# Patient Record
Sex: Female | Born: 1970 | Race: Black or African American | Hispanic: No | Marital: Single | Smoking: Never smoker
Health system: Southern US, Community
[De-identification: ages and names within clinical notes are randomized; demographics above are authoritative.]

## PROBLEM LIST (undated history)

## (undated) ENCOUNTER — Inpatient Hospital Stay (HOSPITAL_COMMUNITY): Payer: Self-pay

## (undated) DIAGNOSIS — O0990 Supervision of high risk pregnancy, unspecified, unspecified trimester: Secondary | ICD-10-CM

## (undated) DIAGNOSIS — F419 Anxiety disorder, unspecified: Secondary | ICD-10-CM

## (undated) DIAGNOSIS — Z641 Problems related to multiparity: Secondary | ICD-10-CM

## (undated) DIAGNOSIS — R002 Palpitations: Secondary | ICD-10-CM

## (undated) DIAGNOSIS — O24419 Gestational diabetes mellitus in pregnancy, unspecified control: Secondary | ICD-10-CM

## (undated) DIAGNOSIS — I10 Essential (primary) hypertension: Secondary | ICD-10-CM

## (undated) HISTORY — PX: NO PAST SURGERIES: SHX2092

---

## 2011-07-02 ENCOUNTER — Encounter (HOSPITAL_BASED_OUTPATIENT_CLINIC_OR_DEPARTMENT_OTHER): Payer: Self-pay | Admitting: *Deleted

## 2011-07-02 ENCOUNTER — Emergency Department (HOSPITAL_BASED_OUTPATIENT_CLINIC_OR_DEPARTMENT_OTHER)
Admission: EM | Admit: 2011-07-02 | Discharge: 2011-07-03 | Disposition: A | Discharge status: Home / Self Care | Payer: Medicaid Other | Attending: Emergency Medicine | Admitting: Emergency Medicine

## 2011-07-02 DIAGNOSIS — O169 Unspecified maternal hypertension, unspecified trimester: Secondary | ICD-10-CM | POA: Insufficient documentation

## 2011-07-02 DIAGNOSIS — Z3490 Encounter for supervision of normal pregnancy, unspecified, unspecified trimester: Secondary | ICD-10-CM

## 2011-07-02 DIAGNOSIS — O09529 Supervision of elderly multigravida, unspecified trimester: Secondary | ICD-10-CM | POA: Insufficient documentation

## 2011-07-02 DIAGNOSIS — O209 Hemorrhage in early pregnancy, unspecified: Secondary | ICD-10-CM | POA: Insufficient documentation

## 2011-07-02 HISTORY — DX: Anxiety disorder, unspecified: F41.9

## 2011-07-02 HISTORY — DX: Essential (primary) hypertension: I10

## 2011-07-02 LAB — DIFFERENTIAL
Eosinophils Absolute: 0.3 10*3/uL (ref 0.0–0.7)
Eosinophils Relative: 3 % (ref 0–5)
Lymphocytes Relative: 26 % (ref 12–46)
Lymphs Abs: 2.9 10*3/uL (ref 0.7–4.0)
Monocytes Relative: 6 % (ref 3–12)

## 2011-07-02 LAB — CBC
Hemoglobin: 12.4 g/dL (ref 12.0–15.0)
MCH: 28.8 pg (ref 26.0–34.0)
MCV: 83.3 fL (ref 78.0–100.0)
Platelets: 320 10*3/uL (ref 150–400)
RBC: 4.31 MIL/uL (ref 3.87–5.11)
WBC: 11.2 10*3/uL — ABNORMAL HIGH (ref 4.0–10.5)

## 2011-07-02 LAB — WET PREP, GENITAL
Trich, Wet Prep: NONE SEEN
Yeast Wet Prep HPF POC: NONE SEEN

## 2011-07-02 LAB — ABO/RH: ABO/RH(D): O POS

## 2011-07-02 LAB — PREGNANCY, URINE: Preg Test, Ur: POSITIVE — AB

## 2011-07-02 NOTE — Discharge Instructions (Signed)
ABCs of Pregnancy A Antepartum care is very important. Be sure you see your doctor and get prenatal care as soon as you think you are pregnant. At this time, you will be tested for infection, genetic abnormalities and potential problems with you and the pregnancy. This is the time to discuss diet, exercise, work, medications, labor, pain medication during labor and the possibility of a cesarean delivery. Ask any questions that may concern you. It is important to see your doctor regularly throughout your pregnancy. Avoid exposure to toxic substances and chemicals - such as cleaning solvents, lead and mercury, some insecticides, and paint. Pregnant women should avoid exposure to paint fumes, and fumes that cause you to feel ill, dizzy or faint. When possible, it is a good idea to have a pre-pregnancy consultation with your caregiver to begin some important recommendations your caregiver suggests such as, taking folic acid, exercising, quitting smoking, avoiding alcoholic beverages, etc. B Breastfeeding is the healthiest choice for both you and your baby. It has many nutritional benefits for the baby and health benefits for the mother. It also creates a very tight and loving bond between the baby and mother. Talk to your doctor, your family and friends, and your employer about how you choose to feed your baby and how they can support you in your decision. Not all birth defects can be prevented, but a woman can take actions that may increase her chance of having a healthy baby. Many birth defects happen very early in pregnancy, sometimes before a woman even knows she is pregnant. Birth defects or abnormalities of any child in your or the father's family should be discussed with your caregiver. Get a good support bra as your breast size changes. Wear it especially when you exercise and when nursing.  C Celebrate the news of your pregnancy with the your spouse/father and family. Childbirth classes are helpful to  take for you and the spouse/father because it helps to understand what happens during the pregnancy, labor and delivery. Cesarean delivery should be discussed with your doctor so you are prepared for that possibility. The pros and cons of circumcision if it is a boy, should be discussed with your pediatrician. Cigarette smoking during pregnancy can result in low birth weight babies. It has been associated with infertility, miscarriages, tubal pregnancies, infant death (mortality) and poor health (morbidity) in childhood. Additionally, cigarette smoking may cause long-term learning disabilities. If you smoke, you should try to quit before getting pregnant and not smoke during the pregnancy. Secondary smoke may also harm a mother and her developing baby. It is a good idea to ask people to stop smoking around you during your pregnancy and after the baby is born. Extra calcium is necessary when you are pregnant and is found in your prenatal vitamin, in dairy products, green leafy vegetables and in calcium supplements. D A healthy diet according to your current weight and height, along with vitamins and mineral supplements should be discussed with your caregiver. Domestic abuse or violence should be made known to your doctor right away to get the situation corrected. Drink more water when you exercise to keep hydrated. Discomfort of your back and legs usually develops and progresses from the middle of the second trimester through to delivery of the baby. This is because of the enlarging baby and uterus, which may also affect your balance. Do not take illegal drugs. Illegal drugs can seriously harm the baby and you. Drink extra fluids (water is best) throughout pregnancy to help  your body keep up with the increases in your blood volume. Drink at least 6 to 8 glasses of water, fruit juice, or milk each day. A good way to know you are drinking enough fluid is when your urine looks almost like clear water or is very light  yellow.  E Eat healthy to get the nutrients you and your unborn baby need. Your meals should include the five basic food groups. Exercise (30 minutes of light to moderate exercise a day) is important and encouraged during pregnancy, if there are no medical problems or problems with the pregnancy. Exercise that causes discomfort or dizziness should be stopped and reported to your caregiver. Emotions during pregnancy can change from being ecstatic to depression and should be understood by you, your partner and your family. F Fetal screening with ultrasound, amniocentesis and monitoring during pregnancy and labor is common and sometimes necessary. Take 400 micrograms of folic acid daily both before, when possible, and during the first few months of pregnancy to reduce the risk of birth defects of the brain and spine. All women who could possibly become pregnant should take a vitamin with folic acid, every day. It is also important to eat a healthy diet with fortified foods (enriched grain products, including cereals, rice, breads, and pastas) and foods with natural sources of folate (orange juice, green leafy vegetables, beans, peanuts, broccoli, asparagus, peas, and lentils). The father should be involved with all aspects of the pregnancy including, the prenatal care, childbirth classes, labor, delivery, and postpartum time. Fathers may also have emotional concerns about being a father, financial needs, and raising a family. G Genetic testing should be done appropriately. It is important to know your family and the father's history. If there have been problems with pregnancies or birth defects in your family, report these to your doctor. Also, genetic counselors can talk with you about the information you might need in making decisions about having a family. You can call a major medical center in your area for help in finding a board-certified genetic counselor. Genetic testing and counseling should be done  before pregnancy when possible, especially if there is a history of problems in the mother's or father's family. Certain ethnic backgrounds are more at risk for genetic defects. H Get familiar with the hospital where you will be having your baby. Get to know how long it takes to get there, the labor and delivery area, and the hospital procedures. Be sure your medical insurance is accepted there. Get your home ready for the baby including, clothes, the baby's room (when possible), furniture and car seat. Hand washing is important throughout the day, especially after handling raw meat and poultry, changing the baby's diaper or using the bathroom. This can help prevent the spread of many bacteria and viruses that cause infection. Your hair may become dry and thinner, but will return to normal a few weeks after the baby is born. Heartburn is a common problem that can be treated by taking antacids recommended by your caregiver, eating smaller meals 5 or 6 times a day, not drinking liquids when eating, drinking between meals and raising the head of your bed 2 to 3 inches. I Insurance to cover you, the baby, doctor and hospital should be reviewed so that you will be prepared to pay any costs not covered by your insurance plan. If you do not have medical insurance, there are usually clinics and services available for you in your community. Take 30 milligrams of iron during  your pregnancy as prescribed by your doctor to reduce the risk of low red blood cells (anemia) later in pregnancy. All women of childbearing age should eat a diet rich in iron. J There should be a joint effort for the mother, father and any other children to adapt to the pregnancy financially, emotionally, and psychologically during the pregnancy. Join a support group for moms-to-be. Or, join a class on parenting or childbirth. Have the family participate when possible. K Know your limits. Let your caregiver know if you experience any of the  following:   Pain of any kind.   Strong cramps.   You develop a lot of weight in a short period of time (5 pounds in 3 to 5 days).   Vaginal bleeding, leaking of amniotic fluid.   Headache, vision problems.   Dizziness, fainting, shortness of breath.   Chest pain.   Fever of 102 F (38.9 C) or higher.   Gush of clear fluid from your vagina.   Painful urination.   Domestic violence.   Irregular heartbeat (palpitations).   Rapid beating of the heart (tachycardia).   Constant feeling sick to your stomach (nauseous) and vomiting.   Trouble walking, fluid retention (edema).   Muscle weakness.   If your baby has decreased activity.   Persistent diarrhea.   Abnormal vaginal discharge.   Uterine contractions at 20-minute intervals.   Back pain that travels down your leg.  L Learn and practice that what you eat and drink should be in moderation and healthy for you and your baby. Legal drugs such as alcohol and caffeine are important issues for pregnant women. There is no safe amount of alcohol a woman can drink while pregnant. Fetal alcohol syndrome, a disorder characterized by growth retardation, facial abnormalities, and central nervous system dysfunction, is caused by a woman's use of alcohol during pregnancy. Caffeine, found in tea, coffee, soft drinks and chocolate, should also be limited. Be sure to read labels when trying to cut down on caffeine during pregnancy. More than 200 foods, beverages, and over-the-counter medications contain caffeine and have a high salt content! There are coffees and teas that do not contain caffeine. M Medical conditions such as diabetes, epilepsy, and high blood pressure should be treated and kept under control before pregnancy when possible, but especially during pregnancy. Ask your caregiver about any medications that may need to be changed or adjusted during pregnancy. If you are currently taking any medications, ask your caregiver if it  is safe to take them while you are pregnant or before getting pregnant when possible. Also, be sure to discuss any herbs or vitamins you are taking. They are medicines, too! Discuss with your doctor all medications, prescribed and over-the-counter, that you are taking. During your prenatal visit, discuss the medications your doctor may give you during labor and delivery. N Never be afraid to ask your doctor or caregiver questions about your health, the progress of the pregnancy, family problems, stressful situations, and recommendation for a pediatrician, if you do not have one. It is better to take all precautions and discuss any questions or concerns you may have during your office visits. It is a good idea to write down your questions before you visit the doctor. O Over-the-counter cough and cold remedies may contain alcohol or other ingredients that should be avoided during pregnancy. Ask your caregiver about prescription, herbs or over-the-counter medications that you are taking or may consider taking while pregnant.  P Physical activity during pregnancy can  benefit both you and your baby by lessening discomfort and fatigue, providing a sense of well-being, and increasing the likelihood of early recovery after delivery. Light to moderate exercise during pregnancy strengthens the belly (abdominal) and back muscles. This helps improve posture. Practicing yoga, walking, swimming, and cycling on a stationary bicycle are usually safe exercises for pregnant women. Avoid scuba diving, exercise at high altitudes (over 3000 feet), skiing, horseback riding, contact sports, etc. Always check with your doctor before beginning any kind of exercise, especially during pregnancy and especially if you did not exercise before getting pregnant. Q Queasiness, stomach upset and morning sickness are common during pregnancy. Eating a couple of crackers or dry toast before getting out of bed. Foods that you normally love may  make you feel sick to your stomach. You may need to substitute other nutritious foods. Eating 5 or 6 small meals a day instead of 3 large ones may make you feel better. Do not drink with your meals, drink between meals. Questions that you have should be written down and asked during your prenatal visits. R Read about and make plans to baby-proof your home. There are important tips for making your home a safer environment for your baby. Review the tips and make your home safer for you and your baby. Read food labels regarding calories, salt and fat content in the food. S Saunas, hot tubs, and steam rooms should be avoided while you are pregnant. Excessive high heat may be harmful during your pregnancy. Your caregiver will screen and examine you for sexually transmitted diseases and genetic disorders during your prenatal visits. Learn the signs of labor. Sexual relations while pregnant is safe unless there is a medical or pregnancy problem and your caregiver advises against it. T Traveling long distances should be avoided especially in the third trimester of your pregnancy. If you do have to travel out of state, be sure to take a copy of your medical records and medical insurance plan with you. You should not travel long distances without seeing your doctor first. Most airlines will not allow you to travel after 36 weeks of pregnancy. Toxoplasmosis is an infection caused by a parasite that can seriously harm an unborn baby. Avoid eating undercooked meat and handling cat litter. Be sure to wear gloves when gardening. Tingling of the hands and fingers is not unusual and is due to fluid retention. This will go away after the baby is born. U Womb (uterus) size increases during the first trimester. Your kidneys will begin to function more efficiently. This may cause you to feel the need to urinate more often. You may also leak urine when sneezing, coughing or laughing. This is due to the growing uterus pressing  against your bladder, which lies directly in front of and slightly under the uterus during the first few months of pregnancy. If you experience burning along with frequency of urination or bloody urine, be sure to tell your doctor. The size of your uterus in the third trimester may cause a problem with your balance. It is advisable to maintain good posture and avoid wearing high heels during this time. An ultrasound of your baby may be necessary during your pregnancy and is safe for you and your baby. V Vaccinations are an important concern for pregnant women. Get needed vaccines before pregnancy. Center for Disease Control (http://www.wolf.info/) has clear guidelines for the use of vaccines during pregnancy. Review the list, be sure to discuss it with your doctor. Prenatal vitamins are helpful  and healthy for you and the baby. Do not take extra vitamins except what is recommended. Taking too much of certain vitamins can cause overdose problems. Continuous vomiting should be reported to your caregiver. Varicose veins may appear especially if there is a family history of varicose veins. They should subside after the delivery of the baby. Support hose helps if there is leg discomfort. W Being overweight or underweight during pregnancy may cause problems. Try to get within 15 pounds of your ideal weight before pregnancy. Remember, pregnancy is not a time to be dieting! Do not stop eating or start skipping meals as your weight increases. Both you and your baby need the calories and nutrition you receive from a healthy diet. Be sure to consult with your doctor about your diet. There is a formula and diet plan available depending on whether you are overweight or underweight. Your caregiver or nutritionist can help and advise you if necessary. X Avoid X-rays. If you must have dental work or diagnostic tests, tell your dentist or physician that you are pregnant so that extra care can be taken. X-rays should only be taken when  the risks of not taking them outweigh the risk of taking them. If needed, only the minimum amount of radiation should be used. When X-rays are necessary, protective lead shields should be used to cover areas of the body that are not being X-rayed. Y Your baby loves you. Breastfeeding your baby creates a loving and very close bond between the two of you. Give your baby a healthy environment to live in while you are pregnant. Infants and children require constant care and guidance. Their health and safety should be carefully watched at all times. After the baby is born, rest or take a nap when the baby is sleeping. Z Get your ZZZs. Be sure to get plenty of rest. Resting on your side as often as possible, especially on your left side is advised. It provides the best circulation to your baby and helps reduce swelling. Try taking a nap for 30 to 45 minutes in the afternoon when possible. After the baby is born rest or take a nap when the baby is sleeping. Try elevating your feet for that amount of time when possible. It helps the circulation in your legs and helps reduce swelling.  Most information courtesy of the CDC. Document Released: 01/10/2005 Document Revised: 12/30/2010 Document Reviewed: 09/24/2008 [facility] Patient Information 2012 [place], [place].Vaginal Bleeding During Pregnancy, First Trimester A small amount of bleeding (spotting) is relatively common in early pregnancy. It usually stops on its own. There are many causes for bleeding or spotting in early pregnancy. Some bleeding may be related to the pregnancy and some may not. Cramping with the bleeding is more serious and concerning. Tell your caregiver if you have any vaginal bleeding.  CAUSES   It is normal in most cases.   The pregnancy ends (miscarriage).   The pregnancy may end (threatened miscarriage).   Infection or inflammation of the cervix.   Growths (polyps) on the cervix.   Pregnancy happens outside of the uterus and in a  fallopian tube (tubal pregnancy).   Many tiny cysts in the uterus instead of pregnancy tissue (molar pregnancy).  SYMPTOMS  Vaginal bleeding or spotting with or without cramps. DIAGNOSIS  To evaluate the pregnancy, your caregiver may:  Do a pelvic exam.   Take blood tests.   Do an ultrasound.  It is very important to follow your caregiver's instructions.  TREATMENT  Evaluation of the pregnancy with blood tests and ultrasound.   Bed rest (getting up to use the bathroom only).   Rho-gam immunization if the mother is Rh negative and the father is Rh positive.  HOME CARE INSTRUCTIONS   If your caregiver orders bed rest, you may need to make arrangements for the care of other children and for other responsibilities. However, your caregiver may allow you to continue light activity.   Keep track of the number of pads you use each day, how often you change pads and how soaked (saturated) they are. Write this down.   Do not use tampons. Do not douche.   Do not have sexual intercourse or orgasms until approved by your physician.   Save any tissue that you pass for your caregiver to see.   Take medicine for cramps only with your caregiver's permission.   Do not take aspirin because it can make you bleed.  SEEK IMMEDIATE MEDICAL CARE IF:   You experience severe cramps in your stomach, back or belly (abdomen).   You have an oral temperature above 102 F (38.9 C), not controlled by medicine.   You pass large clots or tissue.   Your bleeding increases or you become light-headed, weak or have fainting episodes.   You develop chills.   You are leaking or have a gush of fluid from your vagina.   You pass out while having a bowel movement. That may mean you have a ruptured tubal pregnancy.  Document Released: 10/20/2004 Document Revised: 12/30/2010 Document Reviewed: 05/01/2008 [facility] Patient Information 2012 [place], [place].

## 2011-07-02 NOTE — ED Notes (Signed)
Pt states she was seen at the [facility] Tuesday and they told her she was about 7 weeks preg. Onset this a.m. Of bright red vaginal bleeding (able to use panty liner) "but just not stopping"

## 2011-07-02 NOTE — ED Provider Notes (Signed)
History     CSN: [id number]  Arrival date & time 07/02/11  1918   First MD Initiated Contact with Patient 07/02/11 2015      Chief Complaint  Patient presents with  . Vaginal Bleeding    (Consider location/radiation/quality/duration/timing/severity/associated sxs/prior treatment) Patient is a 41 y.o. female presenting with vaginal bleeding. The history is provided by the patient. No language interpreter was used.  Vaginal Bleeding This is a new problem. The current episode started today. The problem occurs constantly. The problem has been gradually worsening. Pertinent negatives include no abdominal pain. The symptoms are aggravated by nothing. She has tried nothing for the symptoms. The treatment provided moderate relief.  Pt is early pregnant.  She had a positive pregnanct test [facility] the health dept.   Pt reports she noticed blood tonight.   Pt is a [id number].  Pt plans to see Dr. Cherly Hensen  Past Medical History  Diagnosis Date  . Hypertension   . Anxiety     History reviewed. No pertinent past surgical history.  History reviewed. No pertinent family history.  History  Substance Use Topics  . Smoking status: Never Smoker   . Smokeless tobacco: Not on file  . Alcohol Use: Yes    OB History    Grav Para Term Preterm Abortions TAB SAB Ect Mult Living   8 7              Review of Systems  Gastrointestinal: Negative for abdominal pain.  Genitourinary: Positive for vaginal bleeding.  All other systems reviewed and are negative.    Allergies  Review of patient's allergies indicates not on file.  Home Medications  No current outpatient prescriptions on file.  BP 154/93  Pulse 102  Temp(Src) 99.3 F (37.4 C) (Oral)  Resp 20  Ht 5' 6.5" (1.689 m)  Wt 215 lb (97.523 kg)  BMI 34.18 kg/m2  SpO2 99%  LMP 05/09/2011  Physical Exam  Nursing note and vitals reviewed. Constitutional: She is oriented to person, place, and time. She appears well-developed and  well-nourished.  HENT:  Head: Normocephalic.  Mouth/Throat: Oropharynx is clear and moist.  Eyes: Conjunctivae and EOM are normal. Pupils are equal, round, and reactive to light.  Neck: Normal range of motion. Neck supple.  Cardiovascular: Normal rate, regular rhythm and normal heart sounds.   Pulmonary/Chest: Effort normal and breath sounds normal.  Abdominal: Soft. Bowel sounds are normal.  Musculoskeletal: Normal range of motion.  Neurological: She is alert and oriented to person, place, and time. She has normal reflexes.  Skin: Skin is warm.  Psychiatric: She has a normal mood and affect.    ED Course  Procedures (including critical care time)  Labs Reviewed - No data to display No results found.   1. Pregnancy       MDM  I did informal ultrasound,   Iup with heart beat,  Pt to return tomorrow for formal ultrasound   Results for orders placed during the hospital encounter of 07/02/11  PREGNANCY, URINE      Component Value Range   Preg Test, Ur POSITIVE (*) NEGATIVE   WET PREP, GENITAL      Component Value Range   Yeast Wet Prep HPF POC NONE SEEN  NONE SEEN    Trich, Wet Prep NONE SEEN  NONE SEEN    Clue Cells Wet Prep HPF POC FEW (*) NONE SEEN    WBC, Wet Prep HPF POC FEW (*) NONE SEEN   HCG, QUANTITATIVE, PREGNANCY  Component Value Range   hCG, Beta Francene Finders 04540 (*) <5 (mIU/mL)  CBC      Component Value Range   WBC 11.2 (*) 4.0 - 10.5 (K/uL)   RBC 4.31  3.87 - 5.11 (MIL/uL)   Hemoglobin 12.4  12.0 - 15.0 (g/dL)   HCT 98.1 (*) 19.1 - 46.0 (%)   MCV 83.3  78.0 - 100.0 (fL)   MCH 28.8  26.0 - 34.0 (pg)   MCHC 34.5  30.0 - 36.0 (g/dL)   RDW 47.8  29.5 - 62.1 (%)   Platelets 320  150 - 400 (K/uL)  DIFFERENTIAL      Component Value Range   Neutrophils Relative 65  43 - 77 (%)   Neutro Abs 7.4  1.7 - 7.7 (K/uL)   Lymphocytes Relative 26  12 - 46 (%)   Lymphs Abs 2.9  0.7 - 4.0 (K/uL)   Monocytes Relative 6  3 - 12 (%)   Monocytes Absolute 0.6   0.1 - 1.0 (K/uL)   Eosinophils Relative 3  0 - 5 (%)   Eosinophils Absolute 0.3  0.0 - 0.7 (K/uL)   Basophils Relative 0  0 - 1 (%)   Basophils Absolute 0.0  0.0 - 0.1 (K/uL)  ABO/RH      Component Value Range   ABO/RH(D) O POS     No rh immune globuloin NOT A RH IMMUNE GLOBULIN CANDIDATE, PT RH POSITIVE     No results found.      Lonia Skinner [place], [place] 07/02/11 [id number]  Lonia Skinner [place], [place] 07/02/11 [phone]

## 2011-07-03 NOTE — ED Notes (Signed)
Per Radiology,  Pt is scheduled for OB US on Monday at 0930

## 2011-07-04 ENCOUNTER — Encounter (HOSPITAL_BASED_OUTPATIENT_CLINIC_OR_DEPARTMENT_OTHER): Payer: Self-pay

## 2011-07-04 ENCOUNTER — Other Ambulatory Visit (HOSPITAL_BASED_OUTPATIENT_CLINIC_OR_DEPARTMENT_OTHER): Payer: Self-pay | Admitting: Physician Assistant

## 2011-07-04 ENCOUNTER — Ambulatory Visit (HOSPITAL_BASED_OUTPATIENT_CLINIC_OR_DEPARTMENT_OTHER)
Admission: RE | Admit: 2011-07-04 | Discharge: 2011-07-04 | Disposition: A | Discharge status: Home / Self Care | Payer: Medicaid Other | Source: Ambulatory Visit | Attending: Physician Assistant | Admitting: Physician Assistant

## 2011-07-04 DIAGNOSIS — O26859 Spotting complicating pregnancy, unspecified trimester: Secondary | ICD-10-CM | POA: Insufficient documentation

## 2011-07-04 DIAGNOSIS — N939 Abnormal uterine and vaginal bleeding, unspecified: Secondary | ICD-10-CM

## 2011-07-05 NOTE — ED Provider Notes (Signed)
Medical screening examination/treatment/procedure(s) were performed by non-physician practitioner and as supervising physician I was immediately available for consultation/collaboration.  Valor [place], MD 07/05/11 1222 

## 2011-08-04 ENCOUNTER — Inpatient Hospital Stay (HOSPITAL_COMMUNITY)
Admission: AD | Admit: 2011-08-04 | Discharge: 2011-08-04 | Disposition: A | Discharge status: Home / Self Care | Payer: Medicaid Other | Source: Ambulatory Visit | Attending: Obstetrics & Gynecology | Admitting: Obstetrics & Gynecology

## 2011-08-04 ENCOUNTER — Encounter (HOSPITAL_COMMUNITY): Payer: Self-pay

## 2011-08-04 DIAGNOSIS — B9689 Other specified bacterial agents as the cause of diseases classified elsewhere: Secondary | ICD-10-CM

## 2011-08-04 DIAGNOSIS — O2390 Unspecified genitourinary tract infection in pregnancy, unspecified trimester: Secondary | ICD-10-CM | POA: Insufficient documentation

## 2011-08-04 DIAGNOSIS — A499 Bacterial infection, unspecified: Secondary | ICD-10-CM

## 2011-08-04 DIAGNOSIS — N760 Acute vaginitis: Secondary | ICD-10-CM | POA: Insufficient documentation

## 2011-08-04 DIAGNOSIS — R5383 Other fatigue: Secondary | ICD-10-CM | POA: Insufficient documentation

## 2011-08-04 DIAGNOSIS — R5381 Other malaise: Secondary | ICD-10-CM | POA: Insufficient documentation

## 2011-08-04 DIAGNOSIS — N949 Unspecified condition associated with female genital organs and menstrual cycle: Secondary | ICD-10-CM | POA: Insufficient documentation

## 2011-08-04 LAB — COMPREHENSIVE METABOLIC PANEL
ALT: 17 U/L (ref 0–35)
AST: 17 U/L (ref 0–37)
Albumin: 3.1 g/dL — ABNORMAL LOW (ref 3.5–5.2)
Calcium: 8.9 mg/dL (ref 8.4–10.5)
Creatinine, Ser: 0.74 mg/dL (ref 0.50–1.10)
GFR calc non Af Amer: >90 mL/min (ref >90)
Sodium: 135 mEq/L (ref 135–145)
Total Protein: 6.7 g/dL (ref 6.0–8.3)

## 2011-08-04 LAB — URINALYSIS, ROUTINE W REFLEX MICROSCOPIC
Bilirubin Urine: NEGATIVE
Nitrite: NEGATIVE
Specific Gravity, Urine: >1.03 — ABNORMAL HIGH (ref 1.005–1.030)
Urobilinogen, UA: 0.2 mg/dL (ref 0.0–1.0)
pH: 6 (ref 5.0–8.0)

## 2011-08-04 LAB — URINE MICROSCOPIC-ADD ON

## 2011-08-04 LAB — CBC
MCH: 27.5 pg (ref 26.0–34.0)
MCHC: 32.5 g/dL (ref 30.0–36.0)
MCV: 84.4 fL (ref 78.0–100.0)
Platelets: 315 10*3/uL (ref 150–400)
RDW: 13.6 % (ref 11.5–15.5)

## 2011-08-04 LAB — WET PREP, GENITAL
Trich, Wet Prep: NONE SEEN
Yeast Wet Prep HPF POC: NONE SEEN

## 2011-08-04 MED ORDER — METRONIDAZOLE 500 MG PO TABS: 500.0000 mg | ORAL_TABLET | Freq: Three times a day (TID) | ORAL | Status: AC

## 2011-08-04 NOTE — MAU Note (Signed)
Patient states she has been having pelvic pain for a long time, feels arms and legs are weak. Has been having a slight vaginal discharge with an odor. Has a history of anxiety that causes her to have periods of racing heart beat. Not at this time.

## 2011-08-04 NOTE — MAU Provider Note (Signed)
History     CSN: [id number]  Arrival date and time: 08/04/11 1252   First Provider Initiated Contact with Patient 08/04/11 1645      Chief Complaint  Patient presents with  . Pelvic Pain  . Vaginal Discharge  . Extremity Weakness   HPI  Tina Richardson 41 y.o. [redacted]w[redacted]d patient presenting today with reports of "weakness" and her "legs feel funny".  Reports that her legs feel like they have" pins and needles like when they fall asleep".  States last child delivered in 2003.  No problems with any of her previous pregnancy, except reports gestational diabetes with her 5th.  Also, reports hx of anemia as a child.  Reports burning after urination starts. Had chlamydia in 1994.  Reports an odor and discharge that she states is so bad she has to shower twice daily.  Last intercourse was yesterday.  OB History    Grav Para Term Preterm Abortions TAB SAB Ect Mult Living   8 7        7       Past Medical History  Diagnosis Date  . Hypertension   . Anxiety     Past Surgical History  Procedure Date  . No past surgeries     Family History  Problem Relation Age of Onset  . Diabetes Mother   . Hypertension Father   . Diabetes Father   . COPD Father   . Asthma Father     History  Substance Use Topics  . Smoking status: Never Smoker   . Smokeless tobacco: Not on file  . Alcohol Use: Yes     none since conception    Allergies:  Allergies  Allergen Reactions  . Dilaudid (Hydromorphone Hcl) Other (See Comments)    Could not stop shaking    Prescriptions prior to admission  Medication Sig Dispense Refill  . Prenatal Vit-Fe Fumarate-FA (PRENATAL MULTIVITAMIN) TABS Take 2 tablets by mouth daily. Patient takes 2  Gummi prenatal vitamins daily        ROS As listed in HPI  Physical Exam   Blood pressure 128/87, pulse 91, temperature 99.6 F (37.6 C), temperature source Oral, resp. rate 20, height 5\' 6"  (1.676 m), weight 109.589 kg (241 lb 9.6 oz), last menstrual period  05/08/2011, SpO2 100.00%.  Physical Exam  Constitutional: She is oriented to person, place, and time. She appears well-developed and well-nourished. No distress.  Cardiovascular: Normal rate.   Respiratory: Effort normal.  GI: Soft. She exhibits no distension and no mass. There is no tenderness. There is no rebound and no guarding.  Genitourinary: Uterus normal. Vaginal discharge found.  Musculoskeletal: Normal range of motion. She exhibits no edema and no tenderness.       Moves extremities well, no obvious neuro deficits  Neurological: She is alert and oriented to person, place, and time. She exhibits normal muscle tone.  Skin: Skin is warm and dry.  Psychiatric: She has a normal mood and affect.    MAU Course  Procedures  MDM  Results for orders placed during the hospital encounter of 08/04/11 (from the past 24 hour(s))  URINALYSIS, ROUTINE W REFLEX MICROSCOPIC     Status: Abnormal   Collection Time   08/04/11  1:51 PM      Component Value Range   Color, Urine YELLOW  YELLOW   APPearance HAZY (*) CLEAR   Specific Gravity, Urine >1.030 (*) 1.005 - 1.030   pH 6.0  5.0 - 8.0   Glucose, UA NEGATIVE  NEGATIVE mg/dL   Hgb urine dipstick TRACE (*) NEGATIVE   Bilirubin Urine NEGATIVE  NEGATIVE   Ketones, ur NEGATIVE  NEGATIVE mg/dL   Protein, ur NEGATIVE  NEGATIVE mg/dL   Urobilinogen, UA 0.2  0.0 - 1.0 mg/dL   Nitrite NEGATIVE  NEGATIVE   Leukocytes, UA NEGATIVE  NEGATIVE  URINE MICROSCOPIC-ADD ON     Status: Abnormal   Collection Time   08/04/11  1:51 PM      Component Value Range   Squamous Epithelial / LPF MANY (*) RARE   WBC, UA 0-2  <3 WBC/hpf   Bacteria, UA FEW (*) RARE   Urine-Other MUCOUS PRESENT    CBC     Status: Normal   Collection Time   08/04/11  5:08 PM      Component Value Range   WBC 9.4  4.0 - 10.5 K/uL   RBC 4.48  3.87 - 5.11 MIL/uL   Hemoglobin 12.3  12.0 - 15.0 g/dL   HCT 08.6  57.8 - 46.9 %   MCV 84.4  78.0 - 100.0 fL   MCH 27.5  26.0 - 34.0 pg    MCHC 32.5  30.0 - 36.0 g/dL   RDW 62.9  52.8 - 41.3 %   Platelets 315  150 - 400 K/uL  COMPREHENSIVE METABOLIC PANEL     Status: Abnormal   Collection Time   08/04/11  5:08 PM      Component Value Range   Sodium 135  135 - 145 mEq/L   Potassium 3.7  3.5 - 5.1 mEq/L   Chloride 103  96 - 112 mEq/L   CO2 24  19 - 32 mEq/L   Glucose, Bld 88  70 - 99 mg/dL   BUN 10  6 - 23 mg/dL   Creatinine, Ser 2.44  0.50 - 1.10 mg/dL   Calcium 8.9  8.4 - 01.0 mg/dL   Total Protein 6.7  6.0 - 8.3 g/dL   Albumin 3.1 (*) 3.5 - 5.2 g/dL   AST 17  0 - 37 U/L   ALT 17  0 - 35 U/L   Alkaline Phosphatase 66  39 - 117 U/L   Total Bilirubin 0.3  0.3 - 1.2 mg/dL   GFR calc non Af Amer >90  >90 mL/min   GFR calc Af Amer >90  >90 mL/min  WET PREP, GENITAL     Status: Abnormal   Collection Time   08/04/11  5:55 PM      Component Value Range   Yeast Wet Prep HPF POC NONE SEEN  NONE SEEN   Trich, Wet Prep NONE SEEN  NONE SEEN   Clue Cells Wet Prep HPF POC FEW (*) NONE SEEN   WBC, Wet Prep HPF POC MANY (*) NONE SEEN   Discussed plan of care with Wynelle Bourgeois, CNM.  Early US done on 07-04-11  See report below.  *RADIOLOGY REPORT*    Clinical Data: Vaginal spotting. Pregnant.  OBSTETRIC <14 WK [place] AND TRANSVAGINAL OB US  Technique: Both transabdominal and transvaginal ultrasound  examinations were performed for complete evaluation of the  gestation as well as the maternal uterus, adnexal regions, and  pelvic cul-de-sac. Transvaginal technique was performed to assess  early pregnancy.  Comparison: None.  Intrauterine gestational sac: Visualized/normal in shape. Single.  Yolk sac: Visualized.  Embryo: Visualized.  Cardiac Activity: Visualized.  Heart Rate: 171 bpm  CRL: 19.9 mm 8 w 4 d [place] EDC: 02/09/2012.  Maternal uterus/adnexae:  No subchorionic hemorrhage.  Ovaries are poorly visualized. There  are two rounded lesions in the uterine fundus, measuring up to 1.3  x 1.7 x 1.3 cm. No free fluid.    IMPRESSION:  1. Single living intrauterine pregnancy with gestational age of [redacted]  weeks 4 days and estimated date of confinement 02/09/2012. No  complicating features.  2. Probable small uterine fibroids.  Original Report Authenticated By: Reyes Ivan, M.D.   Assessment and Plan   Assessment:  Bacterial Vaginosis  Plan:  Metronidazole 500mg  by mouth twice daily x 7 days.   Avoid using soaps or perfumed detergent toilet paper or powders on genitalia. Cotton underwear only and try sleeping without underwear at night.   Servando Salina 08/04/2011, 7:20 PM   Seen and examined. Agree with note.   Also discussed hand numbness/tingling may be due to sleeping position at night.  Usually occurs first thing in AM. Usually one hand. Suggest trying different pillows.

## 2011-08-05 LAB — GC/CHLAMYDIA PROBE AMP, GENITAL: Chlamydia, DNA Probe: NEGATIVE

## 2011-08-31 ENCOUNTER — Other Ambulatory Visit (HOSPITAL_COMMUNITY)
Admission: RE | Admit: 2011-08-31 | Discharge: 2011-08-31 | Disposition: A | Discharge status: Home / Self Care | Payer: Medicaid Other | Source: Ambulatory Visit | Attending: Obstetrics and Gynecology | Admitting: Obstetrics and Gynecology

## 2011-08-31 ENCOUNTER — Ambulatory Visit (INDEPENDENT_AMBULATORY_CARE_PROVIDER_SITE_OTHER): Payer: Medicaid Other | Admitting: Obstetrics and Gynecology

## 2011-08-31 ENCOUNTER — Encounter: Payer: Self-pay | Admitting: Obstetrics and Gynecology

## 2011-08-31 VITALS — BP 134/82 | Temp 98.4°F | Wt 243.8 lb

## 2011-08-31 DIAGNOSIS — D259 Leiomyoma of uterus, unspecified: Secondary | ICD-10-CM

## 2011-08-31 DIAGNOSIS — B9689 Other specified bacterial agents as the cause of diseases classified elsewhere: Secondary | ICD-10-CM | POA: Insufficient documentation

## 2011-08-31 DIAGNOSIS — O169 Unspecified maternal hypertension, unspecified trimester: Secondary | ICD-10-CM | POA: Insufficient documentation

## 2011-08-31 DIAGNOSIS — R030 Elevated blood-pressure reading, without diagnosis of hypertension: Secondary | ICD-10-CM

## 2011-08-31 DIAGNOSIS — Z641 Problems related to multiparity: Secondary | ICD-10-CM | POA: Insufficient documentation

## 2011-08-31 DIAGNOSIS — E669 Obesity, unspecified: Secondary | ICD-10-CM

## 2011-08-31 DIAGNOSIS — D219 Benign neoplasm of connective and other soft tissue, unspecified: Secondary | ICD-10-CM

## 2011-08-31 DIAGNOSIS — O99210 Obesity complicating pregnancy, unspecified trimester: Secondary | ICD-10-CM

## 2011-08-31 DIAGNOSIS — A499 Bacterial infection, unspecified: Secondary | ICD-10-CM

## 2011-08-31 DIAGNOSIS — N760 Acute vaginitis: Secondary | ICD-10-CM

## 2011-08-31 DIAGNOSIS — Z1151 Encounter for screening for human papillomavirus (HPV): Secondary | ICD-10-CM | POA: Insufficient documentation

## 2011-08-31 DIAGNOSIS — O09529 Supervision of elderly multigravida, unspecified trimester: Secondary | ICD-10-CM

## 2011-08-31 DIAGNOSIS — Z01419 Encounter for gynecological examination (general) (routine) without abnormal findings: Secondary | ICD-10-CM | POA: Insufficient documentation

## 2011-08-31 LAB — POCT URINALYSIS DIP (DEVICE)
Nitrite: NEGATIVE
Protein, ur: NEGATIVE mg/dL
Specific Gravity, Urine: 1.025 (ref 1.005–1.030)
Urobilinogen, UA: 0.2 mg/dL (ref 0.0–1.0)

## 2011-08-31 NOTE — Patient Instructions (Addendum)
Pregnancy - Second Trimester The second trimester of pregnancy (3 to 6 months) is a period of rapid growth for you and your baby. At the end of the sixth month, your baby is about 9 inches long and weighs 1 1/2 pounds. You will begin to feel the baby move between 18 and 20 weeks of the pregnancy. This is called quickening. Weight gain is faster. A clear fluid (colostrum) may leak out of your breasts. You may feel small contractions of the womb (uterus). This is known as false labor or Braxton-Hicks contractions. This is like a practice for labor when the baby is ready to be born. Usually, the problems with morning sickness have usually passed by the end of your first trimester. Some women develop small dark blotches (called cholasma, mask of pregnancy) on their face that usually goes away after the baby is born. Exposure to the sun makes the blotches worse. Acne may also develop in some pregnant women and pregnant women who have acne, may find that it goes away. PRENATAL EXAMS  Blood work may continue to be done during prenatal exams. These tests are done to check on your health and the probable health of your baby. Blood work is used to follow your blood levels (hemoglobin). Anemia (low hemoglobin) is common during pregnancy. Iron and vitamins are given to help prevent this. You will also be checked for diabetes between 24 and 28 weeks of the pregnancy. Some of the previous blood tests may be repeated.   The size of the uterus is measured during each visit. This is to make sure that the baby is continuing to grow properly according to the dates of the pregnancy.   Your blood pressure is checked every prenatal visit. This is to make sure you are not getting toxemia.   Your urine is checked to make sure you do not have an infection, diabetes or protein in the urine.   Your weight is checked often to make sure gains are happening at the suggested rate. This is to ensure that both you and your baby are  growing normally.   Sometimes, an ultrasound is performed to confirm the proper growth and development of the baby. This is a test which bounces harmless sound waves off the baby so your caregiver can more accurately determine due dates.  Sometimes, a specialized test is done on the amniotic fluid surrounding the baby. This test is called an amniocentesis. The amniotic fluid is obtained by sticking a needle into the belly (abdomen). This is done to check the chromosomes in instances where there is a concern about possible genetic problems with the baby. It is also sometimes done near the end of pregnancy if an early delivery is required. In this case, it is done to help make sure the baby's lungs are mature enough for the baby to live outside of the womb. CHANGES OCCURING IN THE SECOND TRIMESTER OF PREGNANCY Your body goes through many changes during pregnancy. They vary from person to person. Talk to your caregiver about changes you notice that you are concerned about.  During the second trimester, you will likely have an increase in your appetite. It is normal to have cravings for certain foods. This varies from person to person and pregnancy to pregnancy.   Your lower abdomen will begin to bulge.   You may have to urinate more often because the uterus and baby are pressing on your bladder. It is also common to get more bladder infections during pregnancy (  pain with urination). You can help this by drinking lots of fluids and emptying your bladder before and after intercourse.   You may begin to get stretch marks on your hips, abdomen, and breasts. These are normal changes in the body during pregnancy. There are no exercises or medications to take that prevent this change.   You may begin to develop swollen and bulging veins (varicose veins) in your legs. Wearing support hose, elevating your feet for 15 minutes, 3 to 4 times a day and limiting salt in your diet helps lessen the problem.    Heartburn may develop as the uterus grows and pushes up against the stomach. Antacids recommended by your caregiver helps with this problem. Also, eating smaller meals 4 to 5 times a day helps.   Constipation can be treated with a stool softener or adding bulk to your diet. Drinking lots of fluids, vegetables, fruits, and whole grains are helpful.   Exercising is also helpful. If you have been very active up until your pregnancy, most of these activities can be continued during your pregnancy. If you have been less active, it is helpful to start an exercise program such as walking.   Hemorrhoids (varicose veins in the rectum) may develop at the end of the second trimester. Warm sitz baths and hemorrhoid cream recommended by your caregiver helps hemorrhoid problems.   Backaches may develop during this time of your pregnancy. Avoid heavy lifting, wear low heal shoes and practice good posture to help with backache problems.   Some pregnant women develop tingling and numbness of their hand and fingers because of swelling and tightening of ligaments in the wrist (carpel tunnel syndrome). This goes away after the baby is born.   As your breasts enlarge, you may have to get a bigger bra. Get a comfortable, cotton, support bra. Do not get a nursing bra until the last month of the pregnancy if you will be nursing the baby.   You may get a dark line from your belly button to the pubic area called the linea nigra.   You may develop rosy cheeks because of increase blood flow to the face.   You may develop spider looking lines of the face, neck, arms and chest. These go away after the baby is born.  HOME CARE INSTRUCTIONS   It is extremely important to avoid all smoking, herbs, alcohol, and unprescribed drugs during your pregnancy. These chemicals affect the formation and growth of the baby. Avoid these chemicals throughout the pregnancy to ensure the delivery of a healthy infant.   Most of your home  care instructions are the same as suggested for the first trimester of your pregnancy. Keep your caregiver's appointments. Follow your caregiver's instructions regarding medication use, exercise and diet.   During pregnancy, you are providing food for you and your baby. Continue to eat regular, well-balanced meals. Choose foods such as meat, fish, milk and other low fat dairy products, vegetables, fruits, and whole-grain breads and cereals. Your caregiver will tell you of the ideal weight gain.   A physical sexual relationship may be continued up until near the end of pregnancy if there are no other problems. Problems could include early (premature) leaking of amniotic fluid from the membranes, vaginal bleeding, abdominal pain, or other medical or pregnancy problems.   Exercise regularly if there are no restrictions. Check with your caregiver if you are unsure of the safety of some of your exercises. The greatest weight gain will occur in the   last 2 trimesters of pregnancy. Exercise will help you:   Control your weight.   Get you in shape for labor and delivery.   Lose weight after you have the baby.   Wear a good support or jogging bra for breast tenderness during pregnancy. This may help if worn during sleep. Pads or tissues may be used in the bra if you are leaking colostrum.   Do not use hot tubs, steam rooms or saunas throughout the pregnancy.   Wear your seat belt at all times when driving. This protects you and your baby if you are in an accident.   Avoid raw meat, uncooked cheese, cat litter boxes and soil used by cats. These carry germs that can cause birth defects in the baby.   The second trimester is also a good time to visit your dentist for your dental health if this has not been done yet. Getting your teeth cleaned is OK. Use a soft toothbrush. Brush gently during pregnancy.   It is easier to loose urine during pregnancy. Tightening up and strengthening the pelvic muscles will  help with this problem. Practice stopping your urination while you are going to the bathroom. These are the same muscles you need to strengthen. It is also the muscles you would use as if you were trying to stop from passing gas. You can practice tightening these muscles up 10 times a set and repeating this about 3 times per day. Once you know what muscles to tighten up, do not perform these exercises during urination. It is more likely to contribute to an infection by backing up the urine.   Ask for help if you have financial, counseling or nutritional needs during pregnancy. Your caregiver will be able to offer counseling for these needs as well as refer you for other special needs.   Your skin may become oily. If so, wash your face with mild soap, use non-greasy moisturizer and oil or cream based makeup.  MEDICATIONS AND DRUG USE IN PREGNANCY  Take prenatal vitamins as directed. The vitamin should contain 1 milligram of folic acid. Keep all vitamins out of reach of children. Only a couple vitamins or tablets containing iron may be fatal to a baby or young child when ingested.   Avoid use of all medications, including herbs, over-the-counter medications, not prescribed or suggested by your caregiver. Only take over-the-counter or prescription medicines for pain, discomfort, or fever as directed by your caregiver. Do not use aspirin.   Let your caregiver also know about herbs you may be using.   Alcohol is related to a number of birth defects. This includes fetal alcohol syndrome. All alcohol, in any form, should be avoided completely. Smoking will cause low birth rate and premature babies.   Street or illegal drugs are very harmful to the baby. They are absolutely forbidden. A baby born to an addicted mother will be addicted at birth. The baby will go through the same withdrawal an adult does.  SEEK MEDICAL CARE IF:  You have any concerns or worries during your pregnancy. It is better to call with  your questions if you feel they cannot wait, rather than worry about them. SEEK IMMEDIATE MEDICAL CARE IF:   An unexplained oral temperature above 102 F (38.9 C) develops, or as your caregiver suggests.   You have leaking of fluid from the vagina (birth canal). If leaking membranes are suspected, take your temperature and tell your caregiver of this when you call.   There   is vaginal spotting, bleeding, or passing clots. Tell your caregiver of the amount and how many pads are used. Light spotting in pregnancy is common, especially following intercourse.   You develop a bad smelling vaginal discharge with a change in the color from clear to white.   You continue to feel sick to your stomach (nauseated) and have no relief from remedies suggested. You vomit blood or coffee ground-like materials.   You lose more than 2 pounds of weight or gain more than 2 pounds of weight over 1 week, or as suggested by your caregiver.   You notice swelling of your face, hands, feet, or legs.   You get exposed to [place] measles and have never had them.   You are exposed to fifth disease or chickenpox.   You develop belly (abdominal) pain. Round ligament discomfort is a common non-cancerous (benign) cause of abdominal pain in pregnancy. Your caregiver still must evaluate you.   You develop a bad headache that does not go away.   You develop fever, diarrhea, pain with urination, or shortness of breath.   You develop visual problems, blurry, or double vision.   You fall or are in a car accident or any kind of trauma.   There is mental or physical violence at home.  Document Released: 01/04/2001 Document Revised: 12/30/2010 Document Reviewed: 07/09/2008 [facility] Patient Information 2012 [place], [place]. Pregnancy - Second Trimester The second trimester of pregnancy (3 to 6 months) is a period of rapid growth for you and your baby. At the end of the sixth month, your baby is about 9 inches long and weighs  1 1/2 pounds. You will begin to feel the baby move between 18 and 20 weeks of the pregnancy. This is called quickening. Weight gain is faster. A clear fluid (colostrum) may leak out of your breasts. You may feel small contractions of the womb (uterus). This is known as false labor or Braxton-Hicks contractions. This is like a practice for labor when the baby is ready to be born. Usually, the problems with morning sickness have usually passed by the end of your first trimester. Some women develop small dark blotches (called cholasma, mask of pregnancy) on their face that usually goes away after the baby is born. Exposure to the sun makes the blotches worse. Acne may also develop in some pregnant women and pregnant women who have acne, may find that it goes away. PRENATAL EXAMS  Blood work may continue to be done during prenatal exams. These tests are done to check on your health and the probable health of your baby. Blood work is used to follow your blood levels (hemoglobin). Anemia (low hemoglobin) is common during pregnancy. Iron and vitamins are given to help prevent this. You will also be checked for diabetes between 24 and 28 weeks of the pregnancy. Some of the previous blood tests may be repeated.   The size of the uterus is measured during each visit. This is to make sure that the baby is continuing to grow properly according to the dates of the pregnancy.   Your blood pressure is checked every prenatal visit. This is to make sure you are not getting toxemia.   Your urine is checked to make sure you do not have an infection, diabetes or protein in the urine.   Your weight is checked often to make sure gains are happening at the suggested rate. This is to ensure that both you and your baby are growing normally.   Sometimes,  an ultrasound is performed to confirm the proper growth and development of the baby. This is a test which bounces harmless sound waves off the baby so your caregiver can more  accurately determine due dates.  Sometimes, a specialized test is done on the amniotic fluid surrounding the baby. This test is called an amniocentesis. The amniotic fluid is obtained by sticking a needle into the belly (abdomen). This is done to check the chromosomes in instances where there is a concern about possible genetic problems with the baby. It is also sometimes done near the end of pregnancy if an early delivery is required. In this case, it is done to help make sure the baby's lungs are mature enough for the baby to live outside of the womb. CHANGES OCCURING IN THE SECOND TRIMESTER OF PREGNANCY Your body goes through many changes during pregnancy. They vary from person to person. Talk to your caregiver about changes you notice that you are concerned about.  During the second trimester, you will likely have an increase in your appetite. It is normal to have cravings for certain foods. This varies from person to person and pregnancy to pregnancy.   Your lower abdomen will begin to bulge.   You may have to urinate more often because the uterus and baby are pressing on your bladder. It is also common to get more bladder infections during pregnancy (pain with urination). You can help this by drinking lots of fluids and emptying your bladder before and after intercourse.   You may begin to get stretch marks on your hips, abdomen, and breasts. These are normal changes in the body during pregnancy. There are no exercises or medications to take that prevent this change.   You may begin to develop swollen and bulging veins (varicose veins) in your legs. Wearing support hose, elevating your feet for 15 minutes, 3 to 4 times a day and limiting salt in your diet helps lessen the problem.   Heartburn may develop as the uterus grows and pushes up against the stomach. Antacids recommended by your caregiver helps with this problem. Also, eating smaller meals 4 to 5 times a day helps.   Constipation can  be treated with a stool softener or adding bulk to your diet. Drinking lots of fluids, vegetables, fruits, and whole grains are helpful.   Exercising is also helpful. If you have been very active up until your pregnancy, most of these activities can be continued during your pregnancy. If you have been less active, it is helpful to start an exercise program such as walking.   Hemorrhoids (varicose veins in the rectum) may develop at the end of the second trimester. Warm sitz baths and hemorrhoid cream recommended by your caregiver helps hemorrhoid problems.   Backaches may develop during this time of your pregnancy. Avoid heavy lifting, wear low heal shoes and practice good posture to help with backache problems.   Some pregnant women develop tingling and numbness of their hand and fingers because of swelling and tightening of ligaments in the wrist (carpel tunnel syndrome). This goes away after the baby is born.   As your breasts enlarge, you may have to get a bigger bra. Get a comfortable, cotton, support bra. Do not get a nursing bra until the last month of the pregnancy if you will be nursing the baby.   You may get a dark line from your belly button to the pubic area called the linea nigra.   You may develop rosy cheeks  because of increase blood flow to the face.   You may develop spider looking lines of the face, neck, arms and chest. These go away after the baby is born.  HOME CARE INSTRUCTIONS   It is extremely important to avoid all smoking, herbs, alcohol, and unprescribed drugs during your pregnancy. These chemicals affect the formation and growth of the baby. Avoid these chemicals throughout the pregnancy to ensure the delivery of a healthy infant.   Most of your home care instructions are the same as suggested for the first trimester of your pregnancy. Keep your caregiver's appointments. Follow your caregiver's instructions regarding medication use, exercise and diet.   During  pregnancy, you are providing food for you and your baby. Continue to eat regular, well-balanced meals. Choose foods such as meat, fish, milk and other low fat dairy products, vegetables, fruits, and whole-grain breads and cereals. Your caregiver will tell you of the ideal weight gain.   A physical sexual relationship may be continued up until near the end of pregnancy if there are no other problems. Problems could include early (premature) leaking of amniotic fluid from the membranes, vaginal bleeding, abdominal pain, or other medical or pregnancy problems.   Exercise regularly if there are no restrictions. Check with your caregiver if you are unsure of the safety of some of your exercises. The greatest weight gain will occur in the last 2 trimesters of pregnancy. Exercise will help you:   Control your weight.   Get you in shape for labor and delivery.   Lose weight after you have the baby.   Wear a good support or jogging bra for breast tenderness during pregnancy. This may help if worn during sleep. Pads or tissues may be used in the bra if you are leaking colostrum.   Do not use hot tubs, steam rooms or saunas throughout the pregnancy.   Wear your seat belt at all times when driving. This protects you and your baby if you are in an accident.   Avoid raw meat, uncooked cheese, cat litter boxes and soil used by cats. These carry germs that can cause birth defects in the baby.   The second trimester is also a good time to visit your dentist for your dental health if this has not been done yet. Getting your teeth cleaned is OK. Use a soft toothbrush. Brush gently during pregnancy.   It is easier to loose urine during pregnancy. Tightening up and strengthening the pelvic muscles will help with this problem. Practice stopping your urination while you are going to the bathroom. These are the same muscles you need to strengthen. It is also the muscles you would use as if you were trying to stop from  passing gas. You can practice tightening these muscles up 10 times a set and repeating this about 3 times per day. Once you know what muscles to tighten up, do not perform these exercises during urination. It is more likely to contribute to an infection by backing up the urine.   Ask for help if you have financial, counseling or nutritional needs during pregnancy. Your caregiver will be able to offer counseling for these needs as well as refer you for other special needs.   Your skin may become oily. If so, wash your face with mild soap, use non-greasy moisturizer and oil or cream based makeup.  MEDICATIONS AND DRUG USE IN PREGNANCY  Take prenatal vitamins as directed. The vitamin should contain 1 milligram of folic acid. Keep all  vitamins out of reach of children. Only a couple vitamins or tablets containing iron may be fatal to a baby or young child when ingested.   Avoid use of all medications, including herbs, over-the-counter medications, not prescribed or suggested by your caregiver. Only take over-the-counter or prescription medicines for pain, discomfort, or fever as directed by your caregiver. Do not use aspirin.   Let your caregiver also know about herbs you may be using.   Alcohol is related to a number of birth defects. This includes fetal alcohol syndrome. All alcohol, in any form, should be avoided completely. Smoking will cause low birth rate and premature babies.   Street or illegal drugs are very harmful to the baby. They are absolutely forbidden. A baby born to an addicted mother will be addicted at birth. The baby will go through the same withdrawal an adult does.  SEEK MEDICAL CARE IF:  You have any concerns or worries during your pregnancy. It is better to call with your questions if you feel they cannot wait, rather than worry about them. SEEK IMMEDIATE MEDICAL CARE IF:   An unexplained oral temperature above 102 F (38.9 C) develops, or as your caregiver suggests.   You  have leaking of fluid from the vagina (birth canal). If leaking membranes are suspected, take your temperature and tell your caregiver of this when you call.   There is vaginal spotting, bleeding, or passing clots. Tell your caregiver of the amount and how many pads are used. Light spotting in pregnancy is common, especially following intercourse.   You develop a bad smelling vaginal discharge with a change in the color from clear to white.   You continue to feel sick to your stomach (nauseated) and have no relief from remedies suggested. You vomit blood or coffee ground-like materials.   You lose more than 2 pounds of weight or gain more than 2 pounds of weight over 1 week, or as suggested by your caregiver.   You notice swelling of your face, hands, feet, or legs.   You get exposed to [place] measles and have never had them.   You are exposed to fifth disease or chickenpox.   You develop belly (abdominal) pain. Round ligament discomfort is a common non-cancerous (benign) cause of abdominal pain in pregnancy. Your caregiver still must evaluate you.   You develop a bad headache that does not go away.   You develop fever, diarrhea, pain with urination, or shortness of breath.   You develop visual problems, blurry, or double vision.   You fall or are in a car accident or any kind of trauma.   There is mental or physical violence at home.  Document Released: 01/04/2001 Document Revised: 12/30/2010 Document Reviewed: 07/09/2008 [facility] Patient Information 2012 [place], [place].

## 2011-08-31 NOTE — Progress Notes (Signed)
Pulse 96 Patient reports some pelvic pressure and occasional cramping 1 hr gtt @1102 

## 2011-08-31 NOTE — Addendum Note (Signed)
Addended by: Doreen Salvage on: 08/31/2011 11:35 AM   Modules accepted: Orders

## 2011-08-31 NOTE — Progress Notes (Signed)
Subjective:    Tina Richardson is a G8P7 [redacted]w[redacted]d being seen today for her first obstetrical visit.  Her obstetrical history is significant for advanced maternal age and obesity. Patient does intend to breast feed. Pregnancy history fully reviewed. OB HX: all NSVD 7+-8#11. No PPH. ? GDM with abnl 1 hr in P5 but did not do 3 hr. Unplanned pregnancy but accepting.  Patient reports no bleeding.  Filed Vitals:   08/31/11 0952  BP: 134/82  Temp: 98.4 F (36.9 C)  Weight: 243 lb 12.8 oz (110.587 kg)    HISTORY: OB History    Grav Para Term Preterm Abortions TAB SAB Ect Mult Living   8 7        7      # Outc Date GA Lbr Len/2nd Wgt Sex Del Anes PTL Lv   1 PAR            2 PAR            3 PAR            4 PAR            5 PAR            Comments: largest (8#11) and abnl 1hr glu, did not get 3 hr and was induced at 38 wk   6 PAR            7 PAR        Yes    8 GRA            Comments: Unplanned, accepting. All were NSVD     Past Medical History  Diagnosis Date  . Hypertension   . Anxiety    Past Surgical History  Procedure Date  . No past surgeries    Family History  Problem Relation Age of Onset  . Diabetes Mother   . Hypertension Father   . Diabetes Father   . COPD Father   . Asthma Father      Exam    Uterus:     Pelvic Exam:    Perineum: No Hemorrhoids, Normal Perineum   Vulva: Bartholin's, Urethra, Skene's normal   Vagina:  normal mucosa, normal discharge       Cervix: long, patulous, FTP   Adnexa: not evaluated   Bony Pelvis: gynecoid  System: Breast:  normal appearance, no masses or tenderness   Skin: normal coloration and turgor, no rashes    Neurologic: oriented, grossly non-focal   Extremities: normal strength, tone, and muscle mass   HEENT PERRLA, thyroid ULNS, no mass   Mouth/Teeth mucous membranes moist, pharynx normal without lesions and dental hygiene good   Neck supple and no masses   Cardiovascular: regular rate and rhythm   Respiratory:  appears well, vitals normal, no respiratory distress, acyanotic, normal RR, ear and throat exam is normal, neck free of mass or lymphadenopathy, chest clear, no wheezing, crepitations, rhonchi, normal symmetric air entry   Abdomen: 16 wk size   Urinary: urethral meatus normal      Assessment:    Pregnancy: W0J8 Patient Active Problem List  Diagnosis  . AMA (advanced maternal age) multigravida 35+  . Grand multipara  . Blood pressure elevated  . Obesity complicating pregnancy  . Fibroids  . BV (bacterial vaginosis)        Plan:     Initial labs drawn. 1 hr glucola Prenatal vitamins. Problem list reviewed and updated. Genetic Screening discussed Quad Screen: requested.  Ultrasound discussed; fetal  survey: requested.  Follow up in 4 weeks. 50% of 50 min visit spent on counseling and coordination of care.     Keir Viernes 08/31/2011

## 2011-09-01 LAB — OBSTETRIC PANEL
Eosinophils Absolute: 0.1 10*3/uL (ref 0.0–0.7)
Eosinophils Relative: 1 % (ref 0–5)
Lymphs Abs: 1.7 10*3/uL (ref 0.7–4.0)
MCH: 28.1 pg (ref 26.0–34.0)
MCHC: 33.3 g/dL (ref 30.0–36.0)
MCV: 84.4 fL (ref 78.0–100.0)
Monocytes Relative: 3 % (ref 3–12)
Platelets: 304 10*3/uL (ref 150–400)
RBC: 4.16 MIL/uL (ref 3.87–5.11)
Rh Type: POSITIVE

## 2011-09-02 LAB — CULTURE, OB URINE: Colony Count: 2000

## 2011-09-12 ENCOUNTER — Inpatient Hospital Stay (HOSPITAL_COMMUNITY)
Admission: AD | Admit: 2011-09-12 | Discharge: 2011-09-12 | Disposition: A | Discharge status: Home / Self Care | Payer: Medicaid Other | Source: Ambulatory Visit | Attending: Obstetrics & Gynecology | Admitting: Obstetrics & Gynecology

## 2011-09-12 ENCOUNTER — Encounter (HOSPITAL_COMMUNITY): Payer: Self-pay

## 2011-09-12 DIAGNOSIS — K5900 Constipation, unspecified: Secondary | ICD-10-CM | POA: Insufficient documentation

## 2011-09-12 DIAGNOSIS — R109 Unspecified abdominal pain: Secondary | ICD-10-CM | POA: Insufficient documentation

## 2011-09-12 DIAGNOSIS — N949 Unspecified condition associated with female genital organs and menstrual cycle: Secondary | ICD-10-CM

## 2011-09-12 DIAGNOSIS — O99891 Other specified diseases and conditions complicating pregnancy: Secondary | ICD-10-CM | POA: Insufficient documentation

## 2011-09-12 HISTORY — DX: Problems related to multiparity: Z64.1

## 2011-09-12 LAB — CBC WITH DIFFERENTIAL/PLATELET
Basophils Relative: 0 % (ref 0–1)
Eosinophils Absolute: 0.1 10*3/uL (ref 0.0–0.7)
Eosinophils Relative: 1 % (ref 0–5)
HCT: 34.9 % — ABNORMAL LOW (ref 36.0–46.0)
Hemoglobin: 11.6 g/dL — ABNORMAL LOW (ref 12.0–15.0)
MCH: 28.4 pg (ref 26.0–34.0)
MCHC: 33.2 g/dL (ref 30.0–36.0)
Monocytes Absolute: 0.4 10*3/uL (ref 0.1–1.0)
Monocytes Relative: 4 % (ref 3–12)

## 2011-09-12 LAB — URINALYSIS, ROUTINE W REFLEX MICROSCOPIC
Bilirubin Urine: NEGATIVE
Glucose, UA: NEGATIVE mg/dL
Ketones, ur: 15 mg/dL — AB
pH: 6 (ref 5.0–8.0)

## 2011-09-12 MED ORDER — ACETAMINOPHEN 325 MG PO TABS
650.0000 mg | ORAL_TABLET | Freq: Once | ORAL | Status: AC
  Administered 2011-09-12: 650 mg via ORAL
  Filled 2011-09-12: qty 2

## 2011-09-12 NOTE — MAU Note (Signed)
Pt states intermittent lower abdominal pain noted yesterday. At 0200 this am, pain intensified and continues. Painful when walking.

## 2011-09-12 NOTE — MAU Note (Signed)
Pt denies burning or urgency with voiding. 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 When she took it off, pain began. Dull cramps initially, then became severe per pt. Went home and laid back down, awoke from her sleep with bad cramp. Feels pressure and cramping like menstrual cycle will began. Denies vaginal discharge or bleeding.

## 2011-09-12 NOTE — MAU Provider Note (Signed)
History     CSN: [id number]  Arrival date and time: 09/12/11 4098   First Provider Initiated Contact with Patient 09/12/11 [phone]      Chief Complaint  Patient presents with  . Abdominal Pain   HPI Tina Richardson is 41 y.o. G8P7 [redacted]w[redacted]d weeks presenting with left sided pain that began yesterday--described as dull cramping after taking her support belt off.  At 2 am the pain increased, "coming and going".  Denies vaginal bleeding.  Has constipation, last BM yesterday-after not having one for 2-3 days.  Last seen in the clinic 8/7.  Slight nausea.    Past Medical History  Diagnosis Date  . Hypertension   . Anxiety   . Multiparity     Past Surgical History  Procedure Date  . No past surgeries     Family History  Problem Relation Age of Onset  . Diabetes Mother   . Hypertension Father   . Diabetes Father   . COPD Father   . Asthma Father   . Other Neg Hx     History  Substance Use Topics  . Smoking status: Never Smoker   . Smokeless tobacco: Never Used  . Alcohol Use: No     none since conception    Allergies:  Allergies  Allergen Reactions  . Dilaudid (Hydromorphone Hcl) Other (See Comments)    Could not stop shaking    Prescriptions prior to admission  Medication Sig Dispense Refill  . Prenatal Vit-Fe Fumarate-FA (PRENATAL MULTIVITAMIN) TABS Take 2 tablets by mouth daily. Patient takes 2  Gummi prenatal vitamins daily        Review of Systems  Gastrointestinal: Positive for abdominal pain (left sided pain) and constipation.  Genitourinary:       Negative for vaginal bleeding   Physical Exam   Blood pressure 135/78, pulse 107, temperature 98.5 F (36.9 C), temperature source Oral, resp. rate 18, height 5' 6.5" (1.689 m), weight 112.265 kg (247 lb 8 oz), last menstrual period 05/08/2011, not currently breastfeeding.  Physical Exam  Constitutional: She is oriented to person, place, and time. She appears well-developed and well-nourished. No distress.    HENT:  Head: Normocephalic.  Neck: Normal range of motion.  Cardiovascular: Normal rate.   Respiratory: Effort normal.  GI: Soft. She exhibits no distension and no mass. There is tenderness (lower left sided pain). There is no rebound and no guarding.  Genitourinary: Uterus is enlarged (gravid measures 18-19 week size). No bleeding around the vagina. No vaginal discharge found.  Neurological: She is alert and oriented to person, place, and time.  Skin: Skin is warm and dry.  Psychiatric: She has a normal mood and affect. Her behavior is normal.   +FHR on doppler 175.   Results for orders placed during the hospital encounter of 09/12/11 (from the past 24 hour(s))  CBC WITH DIFFERENTIAL     Status: Abnormal   Collection Time   09/12/11 10:05 AM      Component Value Range   WBC 9.5  4.0 - 10.5 K/uL   RBC 4.09  3.87 - 5.11 MIL/uL   Hemoglobin 11.6 (*) 12.0 - 15.0 g/dL   HCT 47.8 (*) 29.5 - 62.1 %   MCV 85.3  78.0 - 100.0 fL   MCH 28.4  26.0 - 34.0 pg   MCHC 33.2  30.0 - 36.0 g/dL   RDW 30.8  65.7 - 84.6 %   Platelets 262  150 - 400 K/uL   Neutrophils Relative 75  43 - 77 %   Neutro Abs 7.1  1.7 - 7.7 K/uL   Lymphocytes Relative 20  12 - 46 %   Lymphs Abs 1.9  0.7 - 4.0 K/uL   Monocytes Relative 4  3 - 12 %   Monocytes Absolute 0.4  0.1 - 1.0 K/uL   Eosinophils Relative 1  0 - 5 %   Eosinophils Absolute 0.1  0.0 - 0.7 K/uL   Basophils Relative 0  0 - 1 %   Basophils Absolute 0.0  0.0 - 0.1 K/uL  URINALYSIS, ROUTINE W REFLEX MICROSCOPIC     Status: Abnormal   Collection Time   09/12/11 10:25 AM      Component Value Range   Color, Urine YELLOW  YELLOW   APPearance CLEAR  CLEAR   Specific Gravity, Urine 1.025  1.005 - 1.030   pH 6.0  5.0 - 8.0   Glucose, UA NEGATIVE  NEGATIVE mg/dL   Hgb urine dipstick NEGATIVE  NEGATIVE   Bilirubin Urine NEGATIVE  NEGATIVE   Ketones, ur 15 (*) NEGATIVE mg/dL   Protein, ur NEGATIVE  NEGATIVE mg/dL   Urobilinogen, UA 0.2  0.0 - 1.0 mg/dL    Nitrite NEGATIVE  NEGATIVE   Leukocytes, UA NEGATIVE  NEGATIVE    MAU Course  Procedures    MDM 11:15  Tylenol 650mg  po given.  Assessment and Plan  A:  Round ligament pain at [redacted]w[redacted]d gestation     Constipation  P:  Encouraged patient to walk several times a day, stay well hydrated and take COLACE PRN for stool softner      Keep scheduled appointment in clinic for ongoing prenatal care Nahiara Kretzschmar,EVE M 09/12/2011, 9:39 AM

## 2011-09-13 NOTE — MAU Provider Note (Signed)
Medical Screening exam and patient care preformed by advanced practice provider.  Agree with the above management.  

## 2011-09-22 ENCOUNTER — Encounter: Payer: Self-pay | Admitting: *Deleted

## 2011-09-22 ENCOUNTER — Telehealth: Payer: Self-pay | Admitting: *Deleted

## 2011-09-22 NOTE — Telephone Encounter (Signed)
Called pt regarding need to schedule Ob US for anatomy.  Appt made for 09/30/11 @ 1030 with pt's approval.  Pt voiced understanding.

## 2011-09-28 ENCOUNTER — Encounter: Payer: Medicaid Other | Admitting: Advanced Practice Midwife

## 2011-09-30 ENCOUNTER — Ambulatory Visit (HOSPITAL_COMMUNITY)
Admission: RE | Admit: 2011-09-30 | Discharge: 2011-09-30 | Disposition: A | Discharge status: Home / Self Care | Payer: Medicaid Other | Source: Ambulatory Visit | Attending: Obstetrics and Gynecology | Admitting: Obstetrics and Gynecology

## 2011-09-30 DIAGNOSIS — Z1389 Encounter for screening for other disorder: Secondary | ICD-10-CM | POA: Insufficient documentation

## 2011-09-30 DIAGNOSIS — Z363 Encounter for antenatal screening for malformations: Secondary | ICD-10-CM | POA: Insufficient documentation

## 2011-09-30 DIAGNOSIS — O139 Gestational [pregnancy-induced] hypertension without significant proteinuria, unspecified trimester: Secondary | ICD-10-CM | POA: Insufficient documentation

## 2011-09-30 DIAGNOSIS — O3410 Maternal care for benign tumor of corpus uteri, unspecified trimester: Secondary | ICD-10-CM | POA: Insufficient documentation

## 2011-09-30 DIAGNOSIS — O358XX0 Maternal care for other (suspected) fetal abnormality and damage, not applicable or unspecified: Secondary | ICD-10-CM | POA: Insufficient documentation

## 2011-09-30 DIAGNOSIS — O09529 Supervision of elderly multigravida, unspecified trimester: Secondary | ICD-10-CM | POA: Insufficient documentation

## 2011-10-05 ENCOUNTER — Ambulatory Visit (INDEPENDENT_AMBULATORY_CARE_PROVIDER_SITE_OTHER): Payer: Medicaid Other | Admitting: Family Medicine

## 2011-10-05 VITALS — BP 128/84 | Temp 99.1°F | Wt 251.7 lb

## 2011-10-05 DIAGNOSIS — O09529 Supervision of elderly multigravida, unspecified trimester: Secondary | ICD-10-CM

## 2011-10-05 DIAGNOSIS — E669 Obesity, unspecified: Secondary | ICD-10-CM

## 2011-10-05 DIAGNOSIS — Z641 Problems related to multiparity: Secondary | ICD-10-CM

## 2011-10-05 DIAGNOSIS — O99210 Obesity complicating pregnancy, unspecified trimester: Secondary | ICD-10-CM

## 2011-10-05 LAB — POCT URINALYSIS DIP (DEVICE)
Ketones, ur: NEGATIVE mg/dL
Leukocytes, UA: NEGATIVE
Nitrite: NEGATIVE
Protein, ur: NEGATIVE mg/dL
pH: 6 (ref 5.0–8.0)

## 2011-10-05 NOTE — Progress Notes (Signed)
Doing well. Occasional Braxton-Hicks contractions. Interested in water birth/exercise class.

## 2011-10-05 NOTE — Patient Instructions (Signed)
For information on Water Birth classes, call [phone].  The next class is on Wednesday, Sept 18, from 6 to 8 pm in the Education wing of [facility].  Pregnancy - Second Trimester The second trimester of pregnancy (3 to 6 months) is a period of rapid growth for you and your baby. At the end of the sixth month, your baby is about 9 inches long and weighs 1 1/2 pounds. You will begin to feel the baby move between 18 and 20 weeks of the pregnancy. This is called quickening. Weight gain is faster. A clear fluid (colostrum) may leak out of your breasts. You may feel small contractions of the womb (uterus). This is known as false labor or Braxton-Hicks contractions. This is like a practice for labor when the baby is ready to be born. Usually, the problems with morning sickness have usually passed by the end of your first trimester. Some women develop small dark blotches (called cholasma, mask of pregnancy) on their face that usually goes away after the baby is born. Exposure to the sun makes the blotches worse. Acne may also develop in some pregnant women and pregnant women who have acne, may find that it goes away. PRENATAL EXAMS  Blood work may continue to be done during prenatal exams. These tests are done to check on your health and the probable health of your baby. Blood work is used to follow your blood levels (hemoglobin). Anemia (low hemoglobin) is common during pregnancy. Iron and vitamins are given to help prevent this. You will also be checked for diabetes between 24 and 28 weeks of the pregnancy. Some of the previous blood tests may be repeated.   The size of the uterus is measured during each visit. This is to make sure that the baby is continuing to grow properly according to the dates of the pregnancy.   Your blood pressure is checked every prenatal visit. This is to make sure you are not getting toxemia.   Your urine is checked to make sure you do not have an infection, diabetes or  protein in the urine.   Your weight is checked often to make sure gains are happening at the suggested rate. This is to ensure that both you and your baby are growing normally.   Sometimes, an ultrasound is performed to confirm the proper growth and development of the baby. This is a test which bounces harmless sound waves off the baby so your caregiver can more accurately determine due dates.  Sometimes, a specialized test is done on the amniotic fluid surrounding the baby. This test is called an amniocentesis. The amniotic fluid is obtained by sticking a needle into the belly (abdomen). This is done to check the chromosomes in instances where there is a concern about possible genetic problems with the baby. It is also sometimes done near the end of pregnancy if an early delivery is required. In this case, it is done to help make sure the baby's lungs are mature enough for the baby to live outside of the womb. CHANGES OCCURING IN THE SECOND TRIMESTER OF PREGNANCY Your body goes through many changes during pregnancy. They vary from person to person. Talk to your caregiver about changes you notice that you are concerned about.  During the second trimester, you will likely have an increase in your appetite. It is normal to have cravings for certain foods. This varies from person to person and pregnancy to pregnancy.   Your lower abdomen will begin to bulge.  You may have to urinate more often because the uterus and baby are pressing on your bladder. It is also common to get more bladder infections during pregnancy (pain with urination). You can help this by drinking lots of fluids and emptying your bladder before and after intercourse.   You may begin to get stretch marks on your hips, abdomen, and breasts. These are normal changes in the body during pregnancy. There are no exercises or medications to take that prevent this change.   You may begin to develop swollen and bulging veins (varicose veins)  in your legs. Wearing support hose, elevating your feet for 15 minutes, 3 to 4 times a day and limiting salt in your diet helps lessen the problem.   Heartburn may develop as the uterus grows and pushes up against the stomach. Antacids recommended by your caregiver helps with this problem. Also, eating smaller meals 4 to 5 times a day helps.   Constipation can be treated with a stool softener or adding bulk to your diet. Drinking lots of fluids, vegetables, fruits, and whole grains are helpful.   Exercising is also helpful. If you have been very active up until your pregnancy, most of these activities can be continued during your pregnancy. If you have been less active, it is helpful to start an exercise program such as walking.   Hemorrhoids (varicose veins in the rectum) may develop at the end of the second trimester. Warm sitz baths and hemorrhoid cream recommended by your caregiver helps hemorrhoid problems.   Backaches may develop during this time of your pregnancy. Avoid heavy lifting, wear low heal shoes and practice good posture to help with backache problems.   Some pregnant women develop tingling and numbness of their hand and fingers because of swelling and tightening of ligaments in the wrist (carpel tunnel syndrome). This goes away after the baby is born.   As your breasts enlarge, you may have to get a bigger bra. Get a comfortable, cotton, support bra. Do not get a nursing bra until the last month of the pregnancy if you will be nursing the baby.   You may get a dark line from your belly button to the pubic area called the linea nigra.   You may develop rosy cheeks because of increase blood flow to the face.   You may develop spider looking lines of the face, neck, arms and chest. These go away after the baby is born.  HOME CARE INSTRUCTIONS   It is extremely important to avoid all smoking, herbs, alcohol, and unprescribed drugs during your pregnancy. These chemicals affect the  formation and growth of the baby. Avoid these chemicals throughout the pregnancy to ensure the delivery of a healthy infant.   Most of your home care instructions are the same as suggested for the first trimester of your pregnancy. Keep your caregiver's appointments. Follow your caregiver's instructions regarding medication use, exercise and diet.   During pregnancy, you are providing food for you and your baby. Continue to eat regular, well-balanced meals. Choose foods such as meat, fish, milk and other low fat dairy products, vegetables, fruits, and whole-grain breads and cereals. Your caregiver will tell you of the ideal weight gain.   A physical sexual relationship may be continued up until near the end of pregnancy if there are no other problems. Problems could include early (premature) leaking of amniotic fluid from the membranes, vaginal bleeding, abdominal pain, or other medical or pregnancy problems.   Exercise regularly if  there are no restrictions. Check with your caregiver if you are unsure of the safety of some of your exercises. The greatest weight gain will occur in the last 2 trimesters of pregnancy. Exercise will help you:   Control your weight.   Get you in shape for labor and delivery.   Lose weight after you have the baby.   Wear a good support or jogging bra for breast tenderness during pregnancy. This may help if worn during sleep. Pads or tissues may be used in the bra if you are leaking colostrum.   Do not use hot tubs, steam rooms or saunas throughout the pregnancy.   Wear your seat belt at all times when driving. This protects you and your baby if you are in an accident.   Avoid raw meat, uncooked cheese, cat litter boxes and soil used by cats. These carry germs that can cause birth defects in the baby.   The second trimester is also a good time to visit your dentist for your dental health if this has not been done yet. Getting your teeth cleaned is OK. Use a soft  toothbrush. Brush gently during pregnancy.   It is easier to loose urine during pregnancy. Tightening up and strengthening the pelvic muscles will help with this problem. Practice stopping your urination while you are going to the bathroom. These are the same muscles you need to strengthen. It is also the muscles you would use as if you were trying to stop from passing gas. You can practice tightening these muscles up 10 times a set and repeating this about 3 times per day. Once you know what muscles to tighten up, do not perform these exercises during urination. It is more likely to contribute to an infection by backing up the urine.   Ask for help if you have financial, counseling or nutritional needs during pregnancy. Your caregiver will be able to offer counseling for these needs as well as refer you for other special needs.   Your skin may become oily. If so, wash your face with mild soap, use non-greasy moisturizer and oil or cream based makeup.  MEDICATIONS AND DRUG USE IN PREGNANCY  Take prenatal vitamins as directed. The vitamin should contain 1 milligram of folic acid. Keep all vitamins out of reach of children. Only a couple vitamins or tablets containing iron may be fatal to a baby or young child when ingested.   Avoid use of all medications, including herbs, over-the-counter medications, not prescribed or suggested by your caregiver. Only take over-the-counter or prescription medicines for pain, discomfort, or fever as directed by your caregiver. Do not use aspirin.   Let your caregiver also know about herbs you may be using.   Alcohol is related to a number of birth defects. This includes fetal alcohol syndrome. All alcohol, in any form, should be avoided completely. Smoking will cause low birth rate and premature babies.   Street or illegal drugs are very harmful to the baby. They are absolutely forbidden. A baby born to an addicted mother will be addicted at birth. The baby will go  through the same withdrawal an adult does.  SEEK MEDICAL CARE IF:  You have any concerns or worries during your pregnancy. It is better to call with your questions if you feel they cannot wait, rather than worry about them. SEEK IMMEDIATE MEDICAL CARE IF:   An unexplained oral temperature above 102 F (38.9 C) develops, or as your caregiver suggests.   You have  leaking of fluid from the vagina (birth canal). If leaking membranes are suspected, take your temperature and tell your caregiver of this when you call.   There is vaginal spotting, bleeding, or passing clots. Tell your caregiver of the amount and how many pads are used. Light spotting in pregnancy is common, especially following intercourse.   You develop a bad smelling vaginal discharge with a change in the color from clear to white.   You continue to feel sick to your stomach (nauseated) and have no relief from remedies suggested. You vomit blood or coffee ground-like materials.   You lose more than 2 pounds of weight or gain more than 2 pounds of weight over 1 week, or as suggested by your caregiver.   You notice swelling of your face, hands, feet, or legs.   You get exposed to [place] measles and have never had them.   You are exposed to fifth disease or chickenpox.   You develop belly (abdominal) pain. Round ligament discomfort is a common non-cancerous (benign) cause of abdominal pain in pregnancy. Your caregiver still must evaluate you.   You develop a bad headache that does not go away.   You develop fever, diarrhea, pain with urination, or shortness of breath.   You develop visual problems, blurry, or double vision.   You fall or are in a car accident or any kind of trauma.   There is mental or physical violence at home.  Document Released: 01/04/2001 Document Revised: 12/30/2010 Document Reviewed: 07/09/2008 [facility] Patient Information 2012 [place], [place].   Braxton Hicks Contractions Pregnancy is commonly  associated with contractions of the uterus throughout the pregnancy. Towards the end of pregnancy (32 to 34 weeks), these contractions [facility] Willa Rough) can develop more often and may become more forceful. This is not true labor because these contractions do not result in opening (dilatation) and thinning of the cervix. They are sometimes difficult to tell apart from true labor because these contractions can be forceful and people have different pain tolerances. You should not feel embarrassed if you go to the hospital with false labor. Sometimes, the only way to tell if you are in true labor is for your caregiver to follow the changes in the cervix. How to tell the difference between true and false labor:  False labor.   The contractions of false labor are usually shorter, irregular and not as hard as those of true labor.   They are often felt in the front of the lower abdomen and in the groin.   They may leave with walking around or changing positions while lying down.   They get weaker and are shorter lasting as time goes on.   These contractions are usually irregular.   They do not usually become progressively stronger, regular and closer together as with true labor.   True labor.   Contractions in true labor last 30 to 70 seconds, become very regular, usually become more intense, and increase in frequency.   They do not go away with walking.   The discomfort is usually felt in the top of the uterus and spreads to the lower abdomen and low back.   True labor can be determined by your caregiver with an exam. This will show that the cervix is dilating and getting thinner.  If there are no prenatal problems or other health problems associated with the pregnancy, it is completely safe to be sent home with false labor and await the onset of true labor. HOME CARE  INSTRUCTIONS   Keep up with your usual exercises and instructions.   Take medications as directed.   Keep your regular  prenatal appointment.   Eat and drink lightly if you think you are going into labor.   If BH contractions are making you uncomfortable:   Change your activity position from lying down or resting to walking/walking to resting.   Sit and rest in a tub of warm water.   Drink 2 to 3 glasses of water. Dehydration may cause B-H contractions.   Do slow and deep breathing several times an hour.  SEEK IMMEDIATE MEDICAL CARE IF:   Your contractions continue to become stronger, more regular, and closer together.   You have a gushing, burst or leaking of fluid from the vagina.   An oral temperature above 102 F (38.9 C) develops.   You have passage of blood-tinged mucus.   You develop vaginal bleeding.   You develop continuous belly (abdominal) pain.   You have low back pain that you never had before.   You feel the baby's head pushing down causing pelvic pressure.   The baby is not moving as much as it used to.  Document Released: 01/10/2005 Document Revised: 12/30/2010 Document Reviewed: 07/04/2008 [facility] Patient Information 2012 [place], [place].

## 2011-10-05 NOTE — Progress Notes (Signed)
Pt states she had 1 hour GTT last visit but no results available. Will repeat today. Interested in water birth. Information given. Fetal anatomy survey normal, did not visualize spine well. Consider repeat. Quad screen negative.

## 2011-10-05 NOTE — Progress Notes (Signed)
Pulse 94 Patient reports some pelvic pressure and occasional "braxton hicks"

## 2011-10-06 ENCOUNTER — Telehealth: Payer: Self-pay | Admitting: *Deleted

## 2011-10-06 NOTE — Telephone Encounter (Signed)
Message copied by Gerome Apley on Thu Oct 06, 2011  8:53 AM ------      Message from: FERRY, [place]      Created: Thu Oct 06, 2011  7:38 AM       Pt will need a 3 hour GTT. Thanks.

## 2011-10-06 NOTE — Telephone Encounter (Signed)
Called [place] and informed her she did not pass the one hour screen for Glucose and needs to do the 3 hour screen- explained npo after midnight and will be here about 3 hours. Patient requests appointment for tomorrow at 8 to " go ahead and get it done"

## 2011-10-07 ENCOUNTER — Other Ambulatory Visit: Payer: Medicaid Other

## 2011-10-07 DIAGNOSIS — R7309 Other abnormal glucose: Secondary | ICD-10-CM

## 2011-10-08 LAB — GLUCOSE TOLERANCE, 3 HOURS: Glucose Tolerance, 2 hour: 193 mg/dL — ABNORMAL HIGH (ref 70–164)

## 2011-10-10 ENCOUNTER — Telehealth: Payer: Self-pay | Admitting: Medical

## 2011-10-10 DIAGNOSIS — O99810 Abnormal glucose complicating pregnancy: Secondary | ICD-10-CM | POA: Insufficient documentation

## 2011-10-10 NOTE — Telephone Encounter (Signed)
Called pt and informed her of abnormal 3hr GTT results. She will need appt on 9/23 @ [facility] to receive diabetes testing instruction. Someone will call her with appt details and she should plan on being here for atleast 2 hrs that Tina Richardson. Pt voiced understanding.

## 2011-10-10 NOTE — Telephone Encounter (Signed)
Patient called wanting results of prenatal blood work and 3 hour GTT from Friday.

## 2011-10-17 ENCOUNTER — Ambulatory Visit (INDEPENDENT_AMBULATORY_CARE_PROVIDER_SITE_OTHER): Payer: Medicaid Other | Admitting: Obstetrics & Gynecology

## 2011-10-17 ENCOUNTER — Encounter: Payer: Medicaid Other | Attending: Obstetrics and Gynecology | Admitting: Dietician

## 2011-10-17 VITALS — BP 132/81 | Temp 97.9°F | Wt 251.9 lb

## 2011-10-17 DIAGNOSIS — O99810 Abnormal glucose complicating pregnancy: Secondary | ICD-10-CM | POA: Insufficient documentation

## 2011-10-17 DIAGNOSIS — Z713 Dietary counseling and surveillance: Secondary | ICD-10-CM | POA: Insufficient documentation

## 2011-10-17 LAB — POCT URINALYSIS DIP (DEVICE)
Bilirubin Urine: NEGATIVE
Ketones, ur: NEGATIVE mg/dL
Leukocytes, UA: NEGATIVE
pH: 5.5 (ref 5.0–8.0)

## 2011-10-17 NOTE — Progress Notes (Signed)
Nutrition Note: 1st visit consult Pt with h/o of obesity & GDM. Pt has gained 21.9# @ [redacted]w[redacted]d, which is > expected.  Pt reports eating only 2x/ d but drinks ~64 oz of juice/ d, ovaltene & a little water with lemon juice daily. Pt reports nausea & heartburn occ but no vomiting. Pt reports she is not taking PNV anymore but used to take gummy PNV. Pt received written & verbal GDM education. Encouraged no more juice by gradually decreasing amount & increasing water intake. Also encouraged pt to eat more often & include protein with all meals & snacks. Disc importance of PNV & encouraged either 1 regular/ d or 2 chewable multivitamins/d.  Disc wt gain goals of 11-20# or 0.5#/wk. Disc importance of BF with GDM. Pt agrees to decrease juice intake & try to eat 3 meals & 3 snacks/ d with proper CHO/ protein combination. Pt does receive WIC & pt does plan to BF.  F/u in 2-4 wks Blondell Reveal, MS, RD, LDN

## 2011-10-17 NOTE — Progress Notes (Signed)
Diabetes Education:  Seen today for blood glucose monitoring.  I did not have an Accu-chek meter kit, MD is providing a prescription for the kit, strips and lancets for her to take to her pharmacy.  I provided her with a Sales promotion account executive for learning and return demonstration and a few test over the next 2 days.  Lot:  [id number]  Exp:2014/09.   Review of testing times, procedures.  Instructed to test fasting and 2 hours after the first bite of each meal.  Glucose goals Fasting:60-90 an 2 hours after the first bite of each meal: 120 or less.  On return demonstration glucose at 11:45 was 97.  Instructed to bring her glucose log and meter to all clinic appointments. She complains of increasing tiredness during this pregnancy, perhaps this is related to blood glucose levels.  Informed she needs to bring her new meter kit to clinic next week.  Maggie Kristiane Morsch, RN, CDE

## 2011-10-17 NOTE — Patient Instructions (Addendum)
Gestational Diabetes Mellitus Gestational diabetes mellitus (GDM) is diabetes that occurs only during pregnancy. This happens when the body cannot properly handle the glucose (sugar) that increases in the blood after eating. During pregnancy, insulin resistance (reduced sensitivity to insulin) occurs because of the release of hormones from the placenta. Usually, the pancreas of pregnant women produces enough insulin to overcome the resistance that occurs. However, in gestational diabetes, the insulin is there but it does not work effectively. If the resistance is severe enough that the pancreas does not produce enough insulin, extra glucose builds up in the blood.  WHO IS AT RISK FOR DEVELOPING GESTATIONAL DIABETES?  Women with a history of diabetes in the family.   Women over age 25.   Women who are overweight.   Women in certain ethnic groups (Hispanic, African American, Native American, Asian and Pacific Islander).  WHAT CAN HAPPEN TO THE BABY? If the mother's blood glucose is too high while she is pregnant, the extra sugar will travel through the umbilical cord to the baby. Some of the problems the baby may have are:  Large Baby - If the baby receives too much sugar, the baby will gain more weight. This may cause the baby to be too large to be born normally (vaginally) and a Cesarean section (C-section) may be needed.   Low Blood Glucose (hypoglycemia) - The baby makes extra insulin, in response to the extra sugar its gets from its mother. When the baby is born and no longer needs this extra insulin, the baby's blood glucose level may drop.   Jaundice (yellow coloring of the skin and eyes) - This is fairly common in babies. It is caused from a build-up of the chemical called bilirubin. This is rarely serious, but is seen more often in babies whose mothers had gestational diabetes.  RISKS TO THE MOTHER Women who have had gestational diabetes may be at higher risk for some problems,  including:  Preeclampsia or toxemia, which includes problems with high blood pressure. Blood pressure and protein levels in the urine must be checked frequently.   Infections.   Cesarean section (C-section) for delivery.   Developing Type 2 diabetes later in life. About 30-50% will develop diabetes later, especially if obese.  DIAGNOSIS  The hormones that cause insulin resistance are highest at about 24-28 weeks of pregnancy. If symptoms are experienced, they are much like symptoms you would normally expect during pregnancy.  GDM is often diagnosed using a two part method: 1. After 24-28 weeks of pregnancy, the woman drinks a glucose solution and takes a blood test. If the glucose level is high, a second test will be given.  2. Oral Glucose Tolerance Test (OGTT) which is 3 hours long - After not eating overnight, the blood glucose is checked. The woman drinks a glucose solution, and hourly blood glucose tests are taken.  If the woman has risk factors for GDM, the caregiver may test earlier than 24 weeks of pregnancy. TREATMENT  Treatment of GDM is directed at keeping the mother's blood glucose level normal, and may include:  Meal planning.   Taking insulin or other medicine to control your blood glucose level.   Exercise.   Keeping a daily record of the foods you eat.   Blood glucose monitoring and keeping a record of your blood glucose levels.   May monitor ketone levels in the urine, although this is no longer considered necessary in most pregnancies.  HOME CARE INSTRUCTIONS  While you are pregnant:    Follow your caregiver's advice regarding your prenatal appointments, meal planning, exercise, medicines, vitamins, blood and other tests, and physical activities.   Keep a record of your meals, blood glucose tests, and the amount of insulin you are taking (if any). Show this to your caregiver at every prenatal visit.   If you have GDM, you may have problems with hypoglycemia (low  blood glucose). You may suspect this if you become suddenly dizzy, feel shaky, and/or weak. If you think this is happening and you have a glucose meter, try to test your blood glucose level. Follow your caregiver's advice for when and how to treat your low blood glucose. Generally, the 15:15 rule is followed: Treat by consuming 15 grams of carbohydrates, wait 15 minutes, and recheck blood glucose. Examples of 15 grams of carbohydrates are:   1 cup skim or low-fat milk.    cup juice.   3-4 glucose tablets.   5-6 hard candies.   1 small box raisins.    cup regular soda pop.   Practice good hygiene, to avoid infections.   Do not smoke.  SEEK MEDICAL CARE IF:   You develop abnormal vaginal discharge, with or without itching.   You become weak and tired more than expected.   You seem to sweat a lot.   You have a sudden increase in weight, 5 pounds or more in one week.   You are losing weight, 3 pounds or more in a week.   Your blood glucose level is high, and you need instructions on what to do about it.  SEEK IMMEDIATE MEDICAL CARE IF:   You develop a severe headache.   You faint or pass out.   You develop nausea and vomiting.   You become disoriented or confused.   You have a convulsion.   You develop vision problems.   You develop stomach pain.   You develop vaginal bleeding.   You develop uterine contractions.   You have leaking or a gush of fluid from the vagina.  AFTER YOU HAVE THE BABY:  Go to all of your follow-up appointments, and have blood tests as advised by your caregiver.   Maintain a healthy lifestyle, to prevent diabetes in the future. This includes:   Following a healthy meal plan.   Controlling your weight.   Getting enough exercise and proper rest.   Do not smoke.   Breastfeed your baby if you can. This will lower the chance of you and your baby developing diabetes later in life.  For more information about diabetes, go to the American  Diabetes Association at: www.americandiabetesassociation.org. For more information about gestational diabetes, go to the American Congress of Obstetricians and Gynecologists at: www.acog.org. Document Released: 04/18/2000 Document Revised: 12/30/2010 Document Reviewed: 11/10/2008 ExitCare Patient Information 2012 [employer]. 

## 2011-10-17 NOTE — Progress Notes (Signed)
Pulse 92 Feels tired today.

## 2011-10-17 NOTE — Progress Notes (Signed)
Abnormal 3 hr GT, gestational diabetes, start testing BG.

## 2011-10-31 ENCOUNTER — Encounter: Payer: Medicaid Other | Admitting: Family Medicine

## 2011-11-02 ENCOUNTER — Encounter: Payer: Medicaid Other | Admitting: Advanced Practice Midwife

## 2011-11-14 ENCOUNTER — Encounter: Payer: Medicaid Other | Admitting: Advanced Practice Midwife

## 2011-11-15 ENCOUNTER — Encounter (HOSPITAL_BASED_OUTPATIENT_CLINIC_OR_DEPARTMENT_OTHER): Payer: Self-pay

## 2011-11-15 ENCOUNTER — Emergency Department (HOSPITAL_BASED_OUTPATIENT_CLINIC_OR_DEPARTMENT_OTHER)
Admission: EM | Admit: 2011-11-15 | Discharge: 2011-11-15 | Disposition: A | Discharge status: Home / Self Care | Payer: Medicaid Other | Attending: Emergency Medicine | Admitting: Emergency Medicine

## 2011-11-15 DIAGNOSIS — Y92009 Unspecified place in unspecified non-institutional (private) residence as the place of occurrence of the external cause: Secondary | ICD-10-CM | POA: Insufficient documentation

## 2011-11-15 DIAGNOSIS — W19XXXA Unspecified fall, initial encounter: Secondary | ICD-10-CM

## 2011-11-15 DIAGNOSIS — W010XXA Fall on same level from slipping, tripping and stumbling without subsequent striking against object, initial encounter: Secondary | ICD-10-CM | POA: Insufficient documentation

## 2011-11-15 DIAGNOSIS — Y9389 Activity, other specified: Secondary | ICD-10-CM | POA: Insufficient documentation

## 2011-11-15 DIAGNOSIS — Z8659 Personal history of other mental and behavioral disorders: Secondary | ICD-10-CM | POA: Insufficient documentation

## 2011-11-15 DIAGNOSIS — Z641 Problems related to multiparity: Secondary | ICD-10-CM | POA: Insufficient documentation

## 2011-11-15 DIAGNOSIS — S3991XA Unspecified injury of abdomen, initial encounter: Secondary | ICD-10-CM

## 2011-11-15 DIAGNOSIS — M25539 Pain in unspecified wrist: Secondary | ICD-10-CM | POA: Insufficient documentation

## 2011-11-15 DIAGNOSIS — O0990 Supervision of high risk pregnancy, unspecified, unspecified trimester: Secondary | ICD-10-CM | POA: Insufficient documentation

## 2011-11-15 DIAGNOSIS — O99810 Abnormal glucose complicating pregnancy: Secondary | ICD-10-CM | POA: Insufficient documentation

## 2011-11-15 DIAGNOSIS — S3981XA Other specified injuries of abdomen, initial encounter: Secondary | ICD-10-CM | POA: Insufficient documentation

## 2011-11-15 DIAGNOSIS — M25579 Pain in unspecified ankle and joints of unspecified foot: Secondary | ICD-10-CM | POA: Insufficient documentation

## 2011-11-15 DIAGNOSIS — I10 Essential (primary) hypertension: Secondary | ICD-10-CM | POA: Insufficient documentation

## 2011-11-15 DIAGNOSIS — R109 Unspecified abdominal pain: Secondary | ICD-10-CM | POA: Insufficient documentation

## 2011-11-15 HISTORY — DX: Supervision of high risk pregnancy, unspecified, unspecified trimester: O09.90

## 2011-11-15 NOTE — ED Provider Notes (Signed)
History     CSN: [id number]  Arrival date & time 11/15/11  1313   First MD Initiated Contact with Patient 11/15/11 1350      Chief Complaint  Patient presents with  . Fall  . Abdominal Pain  . High Risk Gestation    (Consider location/radiation/quality/duration/timing/severity/associated sxs/prior treatment) The history is provided by the patient.  Tina Richardson is a 40 y.o. female hx of gestational diabetes, HTN here with s/p fall. She is 7 months pregnant. She was taking groceries today and stepped in a hole with R foot and then fell. Tried to embrace herself with R arm but landed on L abdomen as well. No head injury to LOC. Had some cramps but no vaginal bleeding or regular contractions. She was concerned because baby hasn't been moving since the incident.    Past Medical History  Diagnosis Date  . Hypertension   . Anxiety   . Multiparity   . High-risk pregnancy     Past Surgical History  Procedure Date  . No past surgeries     Family History  Problem Relation Age of Onset  . Diabetes Mother   . Hypertension Father   . Diabetes Father   . COPD Father   . Asthma Father   . Other Neg Hx     History  Substance Use Topics  . Smoking status: Never Smoker   . Smokeless tobacco: Never Used  . Alcohol Use: No     none since conception    OB History    Grav Para Term Preterm Abortions TAB SAB Ect Mult Living   9 7        7       Review of Systems  Gastrointestinal: Positive for abdominal pain.  Musculoskeletal:       R wrist and ankle pain   All other systems reviewed and are negative.    Allergies  Dilaudid  Home Medications   Current Outpatient Rx  Name Route Sig Dispense Refill  . PRENATAL MULTIVITAMIN CH Oral Take 2 tablets by mouth daily. Patient takes 2  Gummi prenatal vitamins daily      BP 124/76  Pulse 89  Temp 98.8 F (37.1 C) (Oral)  Resp 18  Ht 5\' 6"  (1.676 m)  Wt 247 lb (112.038 kg)  BMI 39.87 kg/m2  SpO2 98%  LMP  05/08/2011  Physical Exam  Nursing note and vitals reviewed. Constitutional: She is oriented to person, place, and time. She appears well-developed and well-nourished.       Anxious   HENT:  Head: Normocephalic.  Mouth/Throat: Oropharynx is clear and moist.  Eyes: Conjunctivae normal and EOM are normal. Pupils are equal, round, and reactive to light.  Neck: Normal range of motion. Neck supple.  Cardiovascular: Normal rate, regular rhythm and normal heart sounds.   Pulmonary/Chest: Effort normal and breath sounds normal. No respiratory distress. She has no wheezes.  Abdominal: Soft. Bowel sounds are normal.       Gravid uterus, not tender.   Musculoskeletal: Normal range of motion. She exhibits no edema.       R wrist nl ROM. R ankle slightly swollen laterally, but nl ROM. Nl sensation.   Neurological: She is alert and oriented to person, place, and time.  Skin: Skin is warm and dry.  Psychiatric: Her behavior is normal. Judgment and thought content normal.    ED Course  Procedures (including critical care time)   Labs Reviewed  GLUCOSE, CAPILLARY   No results found.  1. Abdominal injury   2. Fall       MDM  Tina Richardson is a 41 y.o. female 7 months pregnant s/p fall. Patient on baby monitor. She likely bruised her R wrist and ankle. She declined xrays.   3:20 PM Patient will need to be monitored for 4 hrs. No events so far. I signed out to Dr. Lynelle Doctor in the ED.        Tina Canal, MD 11/15/11 [phone]

## 2011-11-15 NOTE — ED Notes (Signed)
Pt reports she tripped in the yard falling on left side.  Pt is 7 months pregnant and states she now has lower abdominal pain and hasn't felt baby move since fall.

## 2011-11-15 NOTE — ED Provider Notes (Signed)
Pt was monitored in the ED for several hours.  The fetal monitoring was reassuring per [facility] hospital.  Pt will be discharged home.  Follow up with her OB.  Celene Kras, MD 11/15/11 [phone]

## 2011-11-15 NOTE — Progress Notes (Signed)
Called ED RN and informed FHR tracing was not complete. And cardio needed to be adjusted. ED RN reported multiple attempts had been tried to get continuous but they had difficulty due to fetal movement. Instructed RN to adjusted Cardio and manipulate belts to continuously trace FHR.

## 2011-11-15 NOTE — Progress Notes (Signed)
ED RN Called to Notify monitoring is complete.

## 2011-11-15 NOTE — ED Notes (Signed)
Per Joni Reining with [facility], ok to discontinue fetal monitoring.  No problems with contractions or babies heart rate.  D/C'd monitor.

## 2011-11-15 NOTE — ED Notes (Signed)
Multiple attempts by 2 nurses to reattach fetal US with regular heart beat.  Would find temporally.  Communication with [facility], will continue to monitor for approximately 10 more minutes.  Dr. Lynelle Doctor updated.

## 2011-11-15 NOTE — Progress Notes (Signed)
[facility] ED called regarding pt arrival with c/o fall, ankle pain and decreased FM. Pt is G8P7 and [redacted] weeks pregnant. She is seen by [facility] care. EFM being placed by ED RN

## 2011-12-15 ENCOUNTER — Encounter: Payer: Medicaid Other | Admitting: Advanced Practice Midwife

## 2011-12-25 ENCOUNTER — Inpatient Hospital Stay (HOSPITAL_COMMUNITY)
Admission: AD | Admit: 2011-12-25 | Discharge: 2011-12-25 | Disposition: A | Discharge status: Hospice | Payer: Medicaid Other | Source: Ambulatory Visit | Attending: Obstetrics & Gynecology | Admitting: Obstetrics & Gynecology

## 2011-12-25 DIAGNOSIS — O24419 Gestational diabetes mellitus in pregnancy, unspecified control: Secondary | ICD-10-CM

## 2011-12-25 DIAGNOSIS — N760 Acute vaginitis: Secondary | ICD-10-CM | POA: Insufficient documentation

## 2011-12-25 DIAGNOSIS — O2390 Unspecified genitourinary tract infection in pregnancy, unspecified trimester: Secondary | ICD-10-CM | POA: Insufficient documentation

## 2011-12-25 DIAGNOSIS — B9689 Other specified bacterial agents as the cause of diseases classified elsewhere: Secondary | ICD-10-CM | POA: Insufficient documentation

## 2011-12-25 DIAGNOSIS — A499 Bacterial infection, unspecified: Secondary | ICD-10-CM | POA: Insufficient documentation

## 2011-12-25 DIAGNOSIS — O99891 Other specified diseases and conditions complicating pregnancy: Secondary | ICD-10-CM | POA: Insufficient documentation

## 2011-12-25 DIAGNOSIS — O99810 Abnormal glucose complicating pregnancy: Secondary | ICD-10-CM | POA: Insufficient documentation

## 2011-12-25 LAB — WET PREP, GENITAL

## 2011-12-25 MED ORDER — METRONIDAZOLE 500 MG PO TABS: 500.0000 mg | ORAL_TABLET | Freq: Three times a day (TID) | ORAL | Status: DC

## 2011-12-25 NOTE — MAU Note (Signed)
Pt felt moist underneath low abd and between legs upon awaking from nap.  No active leaking or bleeding and denies any urinary symptoms.

## 2011-12-25 NOTE — MAU Note (Signed)
Pt reports ? Leaking fluid since 1945, occassional uc.

## 2011-12-25 NOTE — MAU Provider Note (Signed)
  History     CSN: [id number]  Arrival date and time: 12/25/11 2045   None     Chief Complaint  Patient presents with  . Rupture of Membranes   HPI   Tina Richardson is 41 y.o. [phone] 33weeks presenting with leaking. Pt was sleeping and woke up all wet.  Pt also reports some mild contractions. Some of the contractions make her head hurts. Pt is GDM, and misses her [facility]. No history of STD.      Past Medical History  Diagnosis Date  . Hypertension   . Anxiety   . Multiparity   . High-risk pregnancy     Past Surgical History  Procedure Date  . No past surgeries     Family History  Problem Relation Age of Onset  . Diabetes Mother   . Hypertension Father   . Diabetes Father   . COPD Father   . Asthma Father   . Other Neg Hx     History  Substance Use Topics  . Smoking status: Never Smoker   . Smokeless tobacco: Never Used  . Alcohol Use: No     Comment: none since conception    Allergies:  Allergies  Allergen Reactions  . Dilaudid (Hydromorphone Hcl) Other (See Comments)    Could not stop shaking    No prescriptions prior to admission    Review of Systems  Constitutional: Negative for fever.  Eyes: Negative for blurred vision and double vision.  Respiratory: Negative for cough and shortness of breath.   Cardiovascular: Negative for chest pain and palpitations.  Gastrointestinal: Negative for nausea, vomiting, abdominal pain, diarrhea and constipation.  Genitourinary: Negative for dysuria and frequency.  Neurological: Negative for dizziness and headaches.   Physical Exam   Blood pressure 124/79, pulse 111, temperature 97.8 F (36.6 C), temperature source Oral, resp. rate 18, height 5\' 6"  (1.676 m), weight 117.028 kg (258 lb), last menstrual period 05/08/2011, SpO2 99.00%.  Physical Exam  Constitutional: She appears well-developed and well-nourished.  HENT:  Head: Normocephalic and atraumatic.  Neck: Normal range  of motion.  Cardiovascular: Normal rate and regular rhythm.   Respiratory: Effort normal and breath sounds normal.  Specular: no pulling, no liquid visualized. Cervical: 0/thick Contractions: Q 9-11min, mild, irregular FHT: baseline: 140s, moderate variability, accels present, no decels Category I  MAU Course  Procedures  MDM CBG A1C Wet prep  Assessment and Plan  Tina Richardson is 41 y.o. [phone] 33weeks presenting with leaking. #GDM - Pt is non compliant on High risk clinic.  - Discussed importance to control  Her sugars, and compliance to her appointments. #BV -Metronidazole 7days  Patient is stable to be discharge  Governor Specking 12/25/2011, 9:59 PM  I was present for the exam and agree with above. Fern neg. Discussed risks of uncontrolled blood sugars including shoulder dystocia, delayed lung maturity and stillbirth. Suggested ways to remember to check blood sugar and importance of keeping appointments. Growth [place] scheduled ASAP. States she has all testing supplies and log. Message to [facility] to schedule appointment ASAP.   Scalp Level, CNM 12/26/2011 10:03 AM

## 2011-12-26 LAB — HEMOGLOBIN A1C
Hgb A1c MFr Bld: 5.6 % (ref <5.7)
Mean Plasma Glucose: 114 mg/dL (ref <117)

## 2011-12-27 ENCOUNTER — Ambulatory Visit (HOSPITAL_COMMUNITY)
Admission: RE | Admit: 2011-12-27 | Discharge: 2011-12-27 | Disposition: A | Discharge status: Home / Self Care | Payer: Medicaid Other | Source: Ambulatory Visit | Attending: Advanced Practice Midwife | Admitting: Advanced Practice Midwife

## 2011-12-27 VITALS — BP 136/83 | HR 110 | Wt 256.8 lb

## 2011-12-27 DIAGNOSIS — O09529 Supervision of elderly multigravida, unspecified trimester: Secondary | ICD-10-CM | POA: Insufficient documentation

## 2011-12-27 DIAGNOSIS — D219 Benign neoplasm of connective and other soft tissue, unspecified: Secondary | ICD-10-CM

## 2011-12-27 DIAGNOSIS — O3410 Maternal care for benign tumor of corpus uteri, unspecified trimester: Secondary | ICD-10-CM | POA: Insufficient documentation

## 2011-12-27 DIAGNOSIS — O139 Gestational [pregnancy-induced] hypertension without significant proteinuria, unspecified trimester: Secondary | ICD-10-CM | POA: Insufficient documentation

## 2011-12-27 DIAGNOSIS — O99810 Abnormal glucose complicating pregnancy: Secondary | ICD-10-CM

## 2011-12-27 DIAGNOSIS — O09299 Supervision of pregnancy with other poor reproductive or obstetric history, unspecified trimester: Secondary | ICD-10-CM | POA: Insufficient documentation

## 2011-12-27 NOTE — ED Notes (Signed)
Pt scheduled with clinic for NST on Friday at 9am.

## 2011-12-27 NOTE — Progress Notes (Signed)
Tina Richardson  was seen today for an ultrasound appointment.  See full report in [facility].  Impression: Single IUP at 33 5/7 weeks Follow up due to poorly compliant Gestational diabetes The estimated fetal weight today is at the 84th %tile; the [facility] measures at the 94th %tile Polyhydramnios is note with an AFI of 31 cm Fundal fibroid noted   Ultrasound findings were reviewed with the patient - polyhydramnios and AC > 90th %tile are suggestive of poorly controlled gestational diabetes.  She was encouraged to do fasting and post prandial fingerstick values appropriate follow up.  Recommendations: Recommend 2x weekly NSTs with weekly AFIs.  Please contact our office if you would like some of the testing to be performed here. Follow up ultrasound in 4 weeks for interval growth. Recommend delivery at 39 weeks or sooner if clinically indictated.  Alpha Gula, MD

## 2011-12-28 NOTE — MAU Provider Note (Signed)
Attestation of Attending Supervision of Advanced Practitioner (CNM/NP): Evaluation and management procedures were performed by the Advanced Practitioner under my supervision and collaboration. I have reviewed the Advanced Practitioner's note and chart, and I agree with the management and plan.  Marijean Montanye H. 8:59 PM   

## 2011-12-30 ENCOUNTER — Ambulatory Visit (INDEPENDENT_AMBULATORY_CARE_PROVIDER_SITE_OTHER): Payer: Medicaid Other | Admitting: *Deleted

## 2011-12-30 VITALS — BP 134/69 | Wt 256.2 lb

## 2011-12-30 DIAGNOSIS — O99810 Abnormal glucose complicating pregnancy: Secondary | ICD-10-CM

## 2011-12-30 DIAGNOSIS — O409XX0 Polyhydramnios, unspecified trimester, not applicable or unspecified: Secondary | ICD-10-CM

## 2011-12-30 NOTE — Progress Notes (Signed)
P = 129   Labor sx reviewed

## 2011-12-30 NOTE — Progress Notes (Signed)
NST performed today was reviewed and was found to be reactive.  Continue recommended antenatal testing and prenatal care.  

## 2012-01-02 ENCOUNTER — Encounter: Payer: Self-pay | Admitting: Family Medicine

## 2012-01-02 ENCOUNTER — Encounter: Payer: Medicaid Other | Attending: Obstetrics and Gynecology | Admitting: Dietician

## 2012-01-02 ENCOUNTER — Ambulatory Visit (INDEPENDENT_AMBULATORY_CARE_PROVIDER_SITE_OTHER): Payer: Medicaid Other | Admitting: Family Medicine

## 2012-01-02 VITALS — BP 128/88 | Temp 98.7°F | Wt 255.6 lb

## 2012-01-02 DIAGNOSIS — O99810 Abnormal glucose complicating pregnancy: Secondary | ICD-10-CM

## 2012-01-02 DIAGNOSIS — O409XX0 Polyhydramnios, unspecified trimester, not applicable or unspecified: Secondary | ICD-10-CM

## 2012-01-02 DIAGNOSIS — Z713 Dietary counseling and surveillance: Secondary | ICD-10-CM | POA: Insufficient documentation

## 2012-01-02 LAB — POCT URINALYSIS DIP (DEVICE)
Bilirubin Urine: NEGATIVE
Hgb urine dipstick: NEGATIVE
Ketones, ur: 40 mg/dL — AB
Nitrite: NEGATIVE
Specific Gravity, Urine: 1.02 (ref 1.005–1.030)
pH: 5.5 (ref 5.0–8.0)

## 2012-01-02 MED ORDER — TETANUS-DIPHTH-ACELL PERTUSSIS 5-2.5-18.5 LF-MCG/0.5 IM SUSP: 0.5000 mL | Freq: Once | INTRAMUSCULAR | Status: DC

## 2012-01-02 NOTE — Progress Notes (Signed)
NST reviewed and reactive. No book--BS in meter are ok--occasional high after dietary indiscretion but BS are only for last few days.  She has been very poorly compliant and was not following diet at all until last week.  She is now trying to follow more correctly.  She can tell me risks of poor BS control today! FBS look ok--80's generally U/s shows poly with AFI of 31 and EFW 84% with AC ahead.

## 2012-01-02 NOTE — Progress Notes (Signed)
Pulse-105 

## 2012-01-02 NOTE — Patient Instructions (Signed)
Gestational Diabetes Mellitus Gestational diabetes mellitus (GDM) is diabetes that occurs only during pregnancy. This happens when the body cannot properly handle the glucose (sugar) that increases in the blood after eating. During pregnancy, insulin resistance (reduced sensitivity to insulin) occurs because of the release of hormones from the placenta. Usually, the pancreas of pregnant women produces enough insulin to overcome the resistance that occurs. However, in gestational diabetes, the insulin is there but it does not work effectively. If the resistance is severe enough that the pancreas does not produce enough insulin, extra glucose builds up in the blood.  WHO IS AT RISK FOR DEVELOPING GESTATIONAL DIABETES?  Women with a history of diabetes in the family.  Women over age 25.  Women who are overweight.  Women in certain ethnic groups (Hispanic, African American, Native American, Asian and Pacific Islander). WHAT CAN HAPPEN TO THE BABY? If the mother's blood glucose is too high while she is pregnant, the extra sugar will travel through the umbilical cord to the baby. Some of the problems the baby may have are:  Large Baby - If the baby receives too much sugar, the baby will gain more weight. This may cause the baby to be too large to be born normally (vaginally) and a Cesarean section (C-section) may be needed.  Low Blood Glucose (hypoglycemia)  The baby makes extra insulin, in response to the extra sugar its gets from its mother. When the baby is born and no longer needs this extra insulin, the baby's blood glucose level may drop.  Jaundice (yellow coloring of the skin and eyes)  This is fairly common in babies. It is caused from a build-up of the chemical called bilirubin. This is rarely serious, but is seen more often in babies whose mothers had gestational diabetes. RISKS TO THE MOTHER Women who have had gestational diabetes may be at higher risk for some problems,  including:  Preeclampsia or toxemia, which includes problems with high blood pressure. Blood pressure and protein levels in the urine must be checked frequently.  Infections.  Cesarean section (C-section) for delivery.  Developing Type 2 diabetes later in life. About 30-50% will develop diabetes later, especially if obese. DIAGNOSIS  The hormones that cause insulin resistance are highest at about 24-28 weeks of pregnancy. If symptoms are experienced, they are much like symptoms you would normally expect during pregnancy.  GDM is often diagnosed using a two part method: 1. After 24-28 weeks of pregnancy, the woman drinks a glucose solution and takes a blood test. If the glucose level is high, a second test will be given. 2. Oral Glucose Tolerance Test (OGTT) which is 3 hours long  After not eating overnight, the blood glucose is checked. The woman drinks a glucose solution, and hourly blood glucose tests are taken. If the woman has risk factors for GDM, the caregiver may test earlier than 24 weeks of pregnancy. TREATMENT  Treatment of GDM is directed at keeping the mother's blood glucose level normal, and may include:  Meal planning.  Taking insulin or other medicine to control your blood glucose level.  Exercise.  Keeping a daily record of the foods you eat.  Blood glucose monitoring and keeping a record of your blood glucose levels.  May monitor ketone levels in the urine, although this is no longer considered necessary in most pregnancies. HOME CARE INSTRUCTIONS  While you are pregnant:  Follow your caregiver's advice regarding your prenatal appointments, meal planning, exercise, medicines, vitamins, blood and other tests, and physical   activities.  Keep a record of your meals, blood glucose tests, and the amount of insulin you are taking (if any). Show this to your caregiver at every prenatal visit.  If you have GDM, you may have problems with hypoglycemia (low blood glucose).  You may suspect this if you become suddenly dizzy, feel shaky, and/or weak. If you think this is happening and you have a glucose meter, try to test your blood glucose level. Follow your caregiver's advice for when and how to treat your low blood glucose. Generally, the 15:15 rule is followed: Treat by consuming 15 grams of carbohydrates, wait 15 minutes, and recheck blood glucose. Examples of 15 grams of carbohydrates are:  1 cup skim or low-fat milk.   cup juice.  3-4 glucose tablets.  5-6 hard candies.  1 small box raisins.   cup regular soda pop.  Practice good hygiene, to avoid infections.  Do not smoke. SEEK MEDICAL CARE IF:   You develop abnormal vaginal discharge, with or without itching.  You become weak and tired more than expected.  You seem to sweat a lot.  You have a sudden increase in weight, 5 pounds or more in one week.  You are losing weight, 3 pounds or more in a week.  Your blood glucose level is high, and you need instructions on what to do about it. SEEK IMMEDIATE MEDICAL CARE IF:   You develop a severe headache.  You faint or pass out.  You develop nausea and vomiting.  You become disoriented or confused.  You have a convulsion.  You develop vision problems.  You develop stomach pain.  You develop vaginal bleeding.  You develop uterine contractions.  You have leaking or a gush of fluid from the vagina. AFTER YOU HAVE THE BABY:  Go to all of your follow-up appointments, and have blood tests as advised by your caregiver.  Maintain a healthy lifestyle, to prevent diabetes in the future. This includes:  Following a healthy meal plan.  Controlling your weight.  Getting enough exercise and proper rest.  Do not smoke.  Breastfeed your baby if you can. This will lower the chance of you and your baby developing diabetes later in life. For more information about diabetes, go to the American Diabetes Association at:  PMFashions.com.cy. For more information about gestational diabetes, go to the [employer] at: RentRule.com.au. Document Released: 04/18/2000 Document Revised: 04/04/2011 Document Reviewed: 11/10/2008 [facility] Patient Information 2013 [place], [place].  Pregnancy - Third Trimester The third trimester of pregnancy (the last 3 months) is a period of the most rapid growth for you and your baby. The baby approaches a length of 20 inches and a weight of 6 to 10 pounds. The baby is adding on fat and getting ready for life outside your body. While inside, babies have periods of sleeping and waking, suck their thumbs, and hiccups. You can often feel small contractions of the uterus. This is false labor. It is also called Braxton-Hicks contractions. This is like a practice for labor. The usual problems in this stage of pregnancy include more difficulty breathing, swelling of the hands and feet from water retention, and having to urinate more often because of the uterus and baby pressing on your bladder.  PRENATAL EXAMS  Blood work may continue to be done during prenatal exams. These tests are done to check on your health and the probable health of your baby. Blood work is used to follow your blood levels (hemoglobin). Anemia (  low hemoglobin) is common during pregnancy. Iron and vitamins are given to help prevent this. You may also continue to be checked for diabetes. Some of the past blood tests may be done again.  The size of the uterus is measured during each visit. This makes sure your baby is growing properly according to your pregnancy dates.  Your blood pressure is checked every prenatal visit. This is to make sure you are not getting toxemia.  Your urine is checked every prenatal visit for infection, diabetes and protein.  Your weight is checked at each visit. This is done to make sure gains are happening at the suggested rate and that you and your  baby are growing normally.  Sometimes, an ultrasound is performed to confirm the position and the proper growth and development of the baby. This is a test done that bounces harmless sound waves off the baby so your caregiver can more accurately determine due dates.  Discuss the type of pain medication and anesthesia you will have during your labor and delivery.  Discuss the possibility and anesthesia if a Cesarean Section might be necessary.  Inform your caregiver if there is any mental or physical violence at home. Sometimes, a specialized non-stress test, contraction stress test and biophysical profile are done to make sure the baby is not having a problem. Checking the amniotic fluid surrounding the baby is called an amniocentesis. The amniotic fluid is removed by sticking a needle into the belly (abdomen). This is sometimes done near the end of pregnancy if an early delivery is required. In this case, it is done to help make sure the baby's lungs are mature enough for the baby to live outside of the womb. If the lungs are not mature and it is unsafe to deliver the baby, an injection of cortisone medication is given to the mother 1 to 2 days before the delivery. This helps the baby's lungs mature and makes it safer to deliver the baby. CHANGES OCCURING IN THE THIRD TRIMESTER OF PREGNANCY Your body goes through many changes during pregnancy. They vary from person to person. Talk to your caregiver about changes you notice and are concerned about.  During the last trimester, you have probably had an increase in your appetite. It is normal to have cravings for certain foods. This varies from person to person and pregnancy to pregnancy.  You may begin to get stretch marks on your hips, abdomen, and breasts. These are normal changes in the body during pregnancy. There are no exercises or medications to take which prevent this change.  Constipation may be treated with a stool softener or adding bulk to  your diet. Drinking lots of fluids, fiber in vegetables, fruits, and whole grains are helpful.  Exercising is also helpful. If you have been very active up until your pregnancy, most of these activities can be continued during your pregnancy. If you have been less active, it is helpful to start an exercise program such as walking. Consult your caregiver before starting exercise programs.  Avoid all smoking, alcohol, un-prescribed drugs, herbs and "street drugs" during your pregnancy. These chemicals affect the formation and growth of the baby. Avoid chemicals throughout the pregnancy to ensure the delivery of a healthy infant.  Backache, varicose veins and hemorrhoids may develop or get worse.  You will tire more easily in the third trimester, which is normal.  The baby's movements may be stronger and more often.  You may become short of breath easily.  Your belly  button may stick out.  A yellow discharge may leak from your breasts called colostrum.  You may have a bloody mucus discharge. This usually occurs a few days to a week before labor begins. HOME CARE INSTRUCTIONS   Keep your caregiver's appointments. Follow your caregiver's instructions regarding medication use, exercise, and diet.  During pregnancy, you are providing food for you and your baby. Continue to eat regular, well-balanced meals. Choose foods such as meat, fish, milk and other low fat dairy products, vegetables, fruits, and whole-grain breads and cereals. Your caregiver will tell you of the ideal weight gain.  A physical sexual relationship may be continued throughout pregnancy if there are no other problems such as early (premature) leaking of amniotic fluid from the membranes, vaginal bleeding, or belly (abdominal) pain.  Exercise regularly if there are no restrictions. Check with your caregiver if you are unsure of the safety of your exercises. Greater weight gain will occur in the last 2 trimesters of pregnancy.  Exercising helps:  Control your weight.  Get you in shape for labor and delivery.  You lose weight after you deliver.  Rest a lot with legs elevated, or as needed for leg cramps or low back pain.  Wear a good support or jogging bra for breast tenderness during pregnancy. This may help if worn during sleep. Pads or tissues may be used in the bra if you are leaking colostrum.  Do not use hot tubs, steam rooms, or saunas.  Wear your seat belt when driving. This protects you and your baby if you are in an accident.  Avoid raw meat, cat litter boxes and soil used by cats. These carry germs that can cause birth defects in the baby.  It is easier to loose urine during pregnancy. Tightening up and strengthening the pelvic muscles will help with this problem. You can practice stopping your urination while you are going to the bathroom. These are the same muscles you need to strengthen. It is also the muscles you would use if you were trying to stop from passing gas. You can practice tightening these muscles up 10 times a set and repeating this about 3 times per day. Once you know what muscles to tighten up, do not perform these exercises during urination. It is more likely to cause an infection by backing up the urine.  Ask for help if you have financial, counseling or nutritional needs during pregnancy. Your caregiver will be able to offer counseling for these needs as well as refer you for other special needs.  Make a list of emergency phone numbers and have them available.  Plan on getting help from family or friends when you go home from the hospital.  Make a trial run to the hospital.  Take prenatal classes with the father to understand, practice and ask questions about the labor and delivery.  Prepare the baby's room/nursery.  Do not travel out of the city unless it is absolutely necessary and with the advice of your caregiver.  Wear only low or no heal shoes to have better balance and  prevent falling. MEDICATIONS AND DRUG USE IN PREGNANCY  Take prenatal vitamins as directed. The vitamin should contain 1 milligram of folic acid. Keep all vitamins out of reach of children. Only a couple vitamins or tablets containing iron may be fatal to a baby or young child when ingested.  Avoid use of all medications, including herbs, over-the-counter medications, not prescribed or suggested by your caregiver. Only take over-the-counter or  prescription medicines for pain, discomfort, or fever as directed by your caregiver. Do not use aspirin, ibuprofen (Motrin, Advil, Nuprin) or naproxen (Aleve) unless OK'd by your caregiver.  Let your caregiver also know about herbs you may be using.  Alcohol is related to a number of birth defects. This includes fetal alcohol syndrome. All alcohol, in any form, should be avoided completely. Smoking will cause low birth rate and premature babies.  Street/illegal drugs are very harmful to the baby. They are absolutely forbidden. A baby born to an addicted mother will be addicted at birth. The baby will go through the same withdrawal an adult does. SEEK MEDICAL CARE IF: You have any concerns or worries during your pregnancy. It is better to call with your questions if you feel they cannot wait, rather than worry about them. DECISIONS ABOUT CIRCUMCISION You may or may not know the sex of your baby. If you know your baby is a boy, it may be time to think about circumcision. Circumcision is the removal of the foreskin of the penis. This is the skin that covers the sensitive end of the penis. There is no proven medical need for this. Often this decision is made on what is popular at the time or based upon religious beliefs and social issues. You can discuss these issues with your caregiver or pediatrician. SEEK IMMEDIATE MEDICAL CARE IF:   An unexplained oral temperature above 102 F (38.9 C) develops, or as your caregiver suggests.  You have leaking of fluid  from the vagina (birth canal). If leaking membranes are suspected, take your temperature and tell your caregiver of this when you call.  There is vaginal spotting, bleeding or passing clots. Tell your caregiver of the amount and how many pads are used.  You develop a bad smelling vaginal discharge with a change in the color from clear to white.  You develop vomiting that lasts more than 24 hours.  You develop chills or fever.  You develop shortness of breath.  You develop burning on urination.  You loose more than 2 pounds of weight or gain more than 2 pounds of weight or as suggested by your caregiver.  You notice sudden swelling of your face, hands, and feet or legs.  You develop belly (abdominal) pain. Round ligament discomfort is a common non-cancerous (benign) cause of abdominal pain in pregnancy. Your caregiver still must evaluate you.  You develop a severe headache that does not go away.  You develop visual problems, blurred or double vision.  If you have not felt your baby move for more than 1 hour. If you think the baby is not moving as much as usual, eat something with sugar in it and lie down on your left side for an hour. The baby should move at least 4 to 5 times per hour. Call right away if your baby moves less than that.  You fall, are in a car accident or any kind of trauma.  There is mental or physical violence at home. Document Released: 01/04/2001 Document Revised: 04/04/2011 Document Reviewed: 07/09/2008 [facility] Patient Information 2013 [place], [place].  Contraception Choices Contraception (birth control) is the use of any methods or devices to prevent pregnancy. Below are some methods to help avoid pregnancy. HORMONAL METHODS   Contraceptive implant. This is a thin, plastic tube containing progesterone hormone. It does not contain estrogen hormone. Your caregiver inserts the tube in the inner part of the upper arm. The tube can remain in place for up to  3  years. After 3 years, the implant must be removed. The implant prevents the ovaries from releasing an egg (ovulation), thickens the cervical mucus which prevents sperm from entering the uterus, and thins the lining of the inside of the uterus.  Progesterone-only injections. These injections are given every 3 months by your caregiver to prevent pregnancy. This synthetic progesterone hormone stops the ovaries from releasing eggs. It also thickens cervical mucus and changes the uterine lining. This makes it harder for sperm to survive in the uterus.  Birth control pills. These pills contain estrogen and progesterone hormone. They work by stopping the egg from forming in the ovary (ovulation). Birth control pills are prescribed by a caregiver.Birth control pills can also be used to treat heavy periods.  Minipill. This type of birth control pill contains only the progesterone hormone. They are taken every day of each month and must be prescribed by your caregiver.  Birth control patch. The patch contains hormones similar to those in birth control pills. It must be changed once a week and is prescribed by a caregiver.  Vaginal ring. The ring contains hormones similar to those in birth control pills. It is left in the vagina for 3 weeks, removed for 1 week, and then a new one is put back in place. The patient must be comfortable inserting and removing the ring from the vagina.A caregiver's prescription is necessary.  Emergency contraception. Emergency contraceptives prevent pregnancy after unprotected sexual intercourse. This pill can be taken right after sex or up to 5 days after unprotected sex. It is most effective the sooner you take the pills after having sexual intercourse. Emergency contraceptive pills are available without a prescription. Check with your pharmacist. Do not use emergency contraception as your only form of birth control. BARRIER METHODS   Female condom. This is a thin sheath (latex or  rubber) that is worn over the penis during sexual intercourse. It can be used with spermicide to increase effectiveness.  Female condom. This is a soft, loose-fitting sheath that is put into the vagina before sexual intercourse.  Diaphragm. This is a soft, latex, dome-shaped barrier that must be fitted by a caregiver. It is inserted into the vagina, along with a spermicidal jelly. It is inserted before intercourse. The diaphragm should be left in the vagina for 6 to 8 hours after intercourse.  Cervical cap. This is a round, soft, latex or plastic cup that fits over the cervix and must be fitted by a caregiver. The cap can be left in place for up to 48 hours after intercourse.  Sponge. This is a soft, circular piece of polyurethane foam. The sponge has spermicide in it. It is inserted into the vagina after wetting it and before sexual intercourse.  Spermicides. These are chemicals that kill or block sperm from entering the cervix and uterus. They come in the form of creams, jellies, suppositories, foam, or tablets. They do not require a prescription. They are inserted into the vagina with an applicator before having sexual intercourse. The process must be repeated every time you have sexual intercourse. INTRAUTERINE CONTRACEPTION  Intrauterine device (IUD). This is a T-shaped device that is put in a woman's uterus during a menstrual period to prevent pregnancy. There are 2 types:  Copper IUD. This type of IUD is wrapped in copper wire and is placed inside the uterus. Copper makes the uterus and fallopian tubes produce a fluid that kills sperm. It can stay in place for 10 years.  Hormone IUD.  This type of IUD contains the hormone progestin (synthetic progesterone). The hormone thickens the cervical mucus and prevents sperm from entering the uterus, and it also thins the uterine lining to prevent implantation of a fertilized egg. The hormone can weaken or kill the sperm that get into the uterus. It can  stay in place for 5 years. PERMANENT METHODS OF CONTRACEPTION  Female tubal ligation. This is when the woman's fallopian tubes are surgically sealed, tied, or blocked to prevent the egg from traveling to the uterus.  Female sterilization. This is when the female has the tubes that carry sperm tied off (vasectomy).This blocks sperm from entering the vagina during sexual intercourse. After the procedure, the man can still ejaculate fluid (semen). NATURAL PLANNING METHODS  Natural family planning. This is not having sexual intercourse or using a barrier method (condom, diaphragm, cervical cap) on days the woman could become pregnant.  Calendar method. This is keeping track of the length of each menstrual cycle and identifying when you are fertile.  Ovulation method. This is avoiding sexual intercourse during ovulation.  Symptothermal method. This is avoiding sexual intercourse during ovulation, using a thermometer and ovulation symptoms.  Post-ovulation method. This is timing sexual intercourse after you have ovulated. Regardless of which type or method of contraception you choose, it is important that you use condoms to protect against the transmission of sexually transmitted diseases (STDs). Talk with your caregiver about which form of contraception is most appropriate for you. Document Released: 01/10/2005 Document Revised: 04/04/2011 Document Reviewed: 05/19/2010 [facility] Patient Information 2013 [place], [place].  Breastfeeding Deciding to breastfeed is one of the best choices you can make for you and your baby. The information that follows gives a brief overview of the benefits of breastfeeding as well as common topics surrounding breastfeeding. BENEFITS OF BREASTFEEDING For the baby  The first milk (colostrum) helps the baby's digestive system function better.   There are antibodies in the mother's milk that help the baby fight off infections.   The baby has a lower incidence of  asthma, allergies, and sudden infant death syndrome (SIDS).   The nutrients in breast milk are better for the baby than infant formulas, and breast milk helps the baby's brain grow better.   Babies who breastfeed have less gas, colic, and constipation.  For the mother  Breastfeeding helps develop a very special bond between the mother and her baby.   Breastfeeding is convenient, always available at the correct temperature, and costs nothing.   Breastfeeding burns calories in the mother and helps her lose weight that was gained during pregnancy.   Breastfeeding makes the uterus contract back down to normal size faster and slows bleeding following delivery.   Breastfeeding mothers have a lower risk of developing breast cancer.  BREASTFEEDING FREQUENCY  A healthy, full-term baby may breastfeed as often as every hour or space his or her feedings to every 3 hours.   Watch your baby for signs of hunger. Nurse your baby if he or she shows signs of hunger. How often you nurse will vary from baby to baby.   Nurse as often as the baby requests, or when you feel the need to reduce the fullness of your breasts.   Awaken the baby if it has been 3 4 hours since the last feeding.   Frequent feeding will help the mother make more milk and will help prevent problems, such as sore nipples and engorgement of the breasts.  BABY'S POSITION AT THE BREAST  Whether  lying down or sitting, be sure that the baby's tummy is facing your tummy.   Support the breast with 4 fingers underneath the breast and the thumb above. Make sure your fingers are well away from the nipple and baby's mouth.   Stroke the baby's lips gently with your finger or nipple.   When the baby's mouth is open wide enough, place all of your nipple and as much of the areola as possible into your baby's mouth.   Pull the baby in close so the tip of the nose and the baby's cheeks touch the breast during the feeding.   FEEDINGS AND SUCTION  The length of each feeding varies from baby to baby and from feeding to feeding.   The baby must suck about 2 3 minutes for your milk to get to him or her. This is called a "let down." For this reason, allow the baby to feed on each breast as long as he or she wants. Your baby will end the feeding when he or she has received the right balance of nutrients.   To break the suction, put your finger into the corner of the baby's mouth and slide it between his or her gums before removing your breast from his or her mouth. This will help prevent sore nipples.  HOW TO TELL WHETHER YOUR BABY IS GETTING ENOUGH BREAST MILK. Wondering whether or not your baby is getting enough milk is a common concern among mothers. You can be assured that your baby is getting enough milk if:   Your baby is actively sucking and you hear swallowing.   Your baby seems relaxed and satisfied after a feeding.   Your baby nurses at least 8 12 times in a 24 hour time period. Nurse your baby until he or she unlatches or falls asleep at the first breast (at least 10 20 minutes), then offer the second side.   Your baby is wetting 5 6 disposable diapers (6 8 cloth diapers) in a 24 hour period by 34 4 days of age.   Your baby is having at least 3 4 stools every 24 hours for the first 6 weeks. The stool should be soft and yellow.   Your baby should gain 4 7 ounces per week after he or she is 45 days old.   Your breasts feel softer after nursing.  REDUCING BREAST ENGORGEMENT  In the first week after your baby is born, you may experience signs of breast engorgement. When breasts are engorged, they feel heavy, warm, full, and may be tender to the touch. You can reduce engorgement if you:   Nurse frequently, every 2 3 hours. Mothers who breastfeed early and often have fewer problems with engorgement.   Place light ice packs on your breasts for 10 20 minutes between feedings. This reduces swelling.  Wrap the ice packs in a lightweight towel to protect your skin. Bags of frozen vegetables work well for this purpose.   Take a warm shower or apply warm, moist heat to your breast for 5 10 minutes just before each feeding. This increases circulation and helps the milk flow.   Gently massage your breast before and during the feeding. Using your finger tips, massage from the chest wall towards your nipple in a circular motion.   Make sure that the baby empties at least one breast at every feeding before switching sides.   Use a breast pump to empty the breasts if your baby is sleepy or not nursing  well. You may also want to pump if you are returning to work oryou feel you are getting engorged.   Avoid bottle feeds, pacifiers, or supplemental feedings of water or juice in place of breastfeeding. Breast milk is all the food your baby needs. It is not necessary for your baby to have water or formula. In fact, to help your breasts make more milk, it is best not to give your baby supplemental feedings during the early weeks.   Be sure the baby is latched on and positioned properly while breastfeeding.   Wear a supportive bra, avoiding underwire styles.   Eat a balanced diet with enough fluids.   Rest often, relax, and take your prenatal vitamins to prevent fatigue, stress, and anemia.  If you follow these suggestions, your engorgement should improve in 24 48 hours. If you are still experiencing difficulty, call your lactation consultant or caregiver.  CARING FOR YOURSELF Take care of your breasts  Bathe or shower daily.   Avoid using soap on your nipples.   Start feedings on your left breast at one feeding and on your right breast at the next feeding.   You will notice an increase in your milk supply 2 5 days after delivery. You may feel some discomfort from engorgement, which makes your breasts very firm and often tender. Engorgement "peaks" out within 24 48 hours. In the  meantime, apply warm moist towels to your breasts for 5 10 minutes before feeding. Gentle massage and expression of some milk before feeding will soften your breasts, making it easier for your baby to latch on.   Wear a well-fitting nursing bra, and air dry your nipples for a 3 after each feeding.   Only use cotton bra pads.   Only use pure lanolin on your nipples after nursing. You do not need to wash it off before feeding the baby again. Another option is to express a few drops of breast milk and gently massage it into your nipples.  Take care of yourself  Eat well-balanced meals and nutritious snacks.   Drinking milk, fruit juice, and water to satisfy your thirst (about 8 glasses a day).   Get plenty of rest.  Avoid foods that you notice affect the baby in a bad way.  SEEK MEDICAL CARE IF:   You have difficulty with breastfeeding and need help.   You have a hard, red, sore area on your breast that is accompanied by a fever.   Your baby is too sleepy to eat well or is having trouble sleeping.   Your baby is wetting less than 6 diapers a day, by 59 days of age.   Your baby's skin or white part of his or her eyes is more yellow than it was in the hospital.   You feel depressed.  Document Released: 01/10/2005 Document Revised: 07/12/2011 Document Reviewed: 04/10/2011 [facility] Patient Information 2013 [place], [place].

## 2012-01-02 NOTE — Progress Notes (Signed)
Nutrition Note:  1st visit GDM - has had with 5th child as well.  Not routinely checking BS.  No food allergies.  Not taking PNV - gummies increased her  BS.  3 meals & 1-2 snacks/day.  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  Has experienced Pica - craving sheet rock dust and gasoline/has not eaten any. \ Weight gain is greater than expected. Discussed importance of regular meals and snacks and proper CHO:PRO combinations. F/U as needed. Candice C. Earlene Plater, MPH, RD, LDN

## 2012-01-03 ENCOUNTER — Encounter: Payer: Self-pay | Admitting: Advanced Practice Midwife

## 2012-01-03 DIAGNOSIS — O409XX0 Polyhydramnios, unspecified trimester, not applicable or unspecified: Secondary | ICD-10-CM | POA: Insufficient documentation

## 2012-01-06 ENCOUNTER — Ambulatory Visit (INDEPENDENT_AMBULATORY_CARE_PROVIDER_SITE_OTHER): Payer: Medicaid Other | Admitting: *Deleted

## 2012-01-06 VITALS — BP 124/79 | Wt 253.2 lb

## 2012-01-06 DIAGNOSIS — O99810 Abnormal glucose complicating pregnancy: Secondary | ICD-10-CM

## 2012-01-06 DIAGNOSIS — O409XX0 Polyhydramnios, unspecified trimester, not applicable or unspecified: Secondary | ICD-10-CM

## 2012-01-06 NOTE — Progress Notes (Signed)
P = 94 

## 2012-01-09 ENCOUNTER — Encounter: Payer: Medicaid Other | Admitting: Obstetrics & Gynecology

## 2012-01-10 ENCOUNTER — Ambulatory Visit (INDEPENDENT_AMBULATORY_CARE_PROVIDER_SITE_OTHER): Payer: Medicaid Other | Admitting: *Deleted

## 2012-01-10 DIAGNOSIS — O99810 Abnormal glucose complicating pregnancy: Secondary | ICD-10-CM

## 2012-01-10 DIAGNOSIS — O409XX0 Polyhydramnios, unspecified trimester, not applicable or unspecified: Secondary | ICD-10-CM

## 2012-01-12 NOTE — Progress Notes (Signed)
12/17 NST reviewed and reactive 

## 2012-01-13 ENCOUNTER — Ambulatory Visit (INDEPENDENT_AMBULATORY_CARE_PROVIDER_SITE_OTHER): Payer: Medicaid Other | Admitting: *Deleted

## 2012-01-13 VITALS — BP 124/73 | Wt 256.1 lb

## 2012-01-13 DIAGNOSIS — O99810 Abnormal glucose complicating pregnancy: Secondary | ICD-10-CM

## 2012-01-13 DIAGNOSIS — O409XX0 Polyhydramnios, unspecified trimester, not applicable or unspecified: Secondary | ICD-10-CM

## 2012-01-13 NOTE — Progress Notes (Signed)
12/13 NST reviewed and reactive 

## 2012-01-13 NOTE — Progress Notes (Signed)
P = 92 

## 2012-01-16 ENCOUNTER — Other Ambulatory Visit: Payer: Medicaid Other

## 2012-01-19 ENCOUNTER — Ambulatory Visit (INDEPENDENT_AMBULATORY_CARE_PROVIDER_SITE_OTHER): Payer: Medicaid Other | Admitting: *Deleted

## 2012-01-19 VITALS — BP 132/81 | Wt 257.6 lb

## 2012-01-19 DIAGNOSIS — O99810 Abnormal glucose complicating pregnancy: Secondary | ICD-10-CM

## 2012-01-19 DIAGNOSIS — O409XX0 Polyhydramnios, unspecified trimester, not applicable or unspecified: Secondary | ICD-10-CM

## 2012-01-19 NOTE — Progress Notes (Signed)
P = 89      [place] growth scheduled 12/30

## 2012-01-19 NOTE — Progress Notes (Signed)
NST reviewed and reactive.  Lovelle Lema L. Harraway-Smith, M.D., FACOG    

## 2012-01-23 ENCOUNTER — Encounter: Payer: Self-pay | Admitting: Advanced Practice Midwife

## 2012-01-23 ENCOUNTER — Other Ambulatory Visit (HOSPITAL_COMMUNITY)
Admission: RE | Admit: 2012-01-23 | Discharge: 2012-01-23 | Disposition: A | Discharge status: Home / Self Care | Payer: Medicaid Other | Source: Ambulatory Visit | Attending: Obstetrics & Gynecology | Admitting: Obstetrics & Gynecology

## 2012-01-23 ENCOUNTER — Ambulatory Visit (INDEPENDENT_AMBULATORY_CARE_PROVIDER_SITE_OTHER): Payer: Medicaid Other | Admitting: Obstetrics & Gynecology

## 2012-01-23 ENCOUNTER — Ambulatory Visit (HOSPITAL_COMMUNITY)
Admission: RE | Admit: 2012-01-23 | Discharge: 2012-01-23 | Disposition: A | Discharge status: Home / Self Care | Payer: Medicaid Other | Source: Ambulatory Visit | Attending: Advanced Practice Midwife | Admitting: Advanced Practice Midwife

## 2012-01-23 VITALS — BP 125/80 | HR 95 | Wt 256.5 lb

## 2012-01-23 VITALS — BP 126/82 | Temp 97.7°F | Wt 254.2 lb

## 2012-01-23 DIAGNOSIS — O3660X0 Maternal care for excessive fetal growth, unspecified trimester, not applicable or unspecified: Secondary | ICD-10-CM | POA: Insufficient documentation

## 2012-01-23 DIAGNOSIS — O99210 Obesity complicating pregnancy, unspecified trimester: Secondary | ICD-10-CM | POA: Insufficient documentation

## 2012-01-23 DIAGNOSIS — N760 Acute vaginitis: Secondary | ICD-10-CM | POA: Insufficient documentation

## 2012-01-23 DIAGNOSIS — E669 Obesity, unspecified: Secondary | ICD-10-CM | POA: Insufficient documentation

## 2012-01-23 DIAGNOSIS — Z113 Encounter for screening for infections with a predominantly sexual mode of transmission: Secondary | ICD-10-CM | POA: Insufficient documentation

## 2012-01-23 DIAGNOSIS — D219 Benign neoplasm of connective and other soft tissue, unspecified: Secondary | ICD-10-CM

## 2012-01-23 DIAGNOSIS — O09529 Supervision of elderly multigravida, unspecified trimester: Secondary | ICD-10-CM | POA: Insufficient documentation

## 2012-01-23 DIAGNOSIS — O09299 Supervision of pregnancy with other poor reproductive or obstetric history, unspecified trimester: Secondary | ICD-10-CM | POA: Insufficient documentation

## 2012-01-23 DIAGNOSIS — O3410 Maternal care for benign tumor of corpus uteri, unspecified trimester: Secondary | ICD-10-CM | POA: Insufficient documentation

## 2012-01-23 DIAGNOSIS — N898 Other specified noninflammatory disorders of vagina: Secondary | ICD-10-CM

## 2012-01-23 DIAGNOSIS — O99810 Abnormal glucose complicating pregnancy: Secondary | ICD-10-CM

## 2012-01-23 DIAGNOSIS — O0990 Supervision of high risk pregnancy, unspecified, unspecified trimester: Secondary | ICD-10-CM

## 2012-01-23 DIAGNOSIS — O409XX0 Polyhydramnios, unspecified trimester, not applicable or unspecified: Secondary | ICD-10-CM

## 2012-01-23 DIAGNOSIS — O139 Gestational [pregnancy-induced] hypertension without significant proteinuria, unspecified trimester: Secondary | ICD-10-CM | POA: Insufficient documentation

## 2012-01-23 DIAGNOSIS — O9989 Other specified diseases and conditions complicating pregnancy, childbirth and the puerperium: Secondary | ICD-10-CM

## 2012-01-23 DIAGNOSIS — O26899 Other specified pregnancy related conditions, unspecified trimester: Secondary | ICD-10-CM

## 2012-01-23 LAB — POCT URINALYSIS DIP (DEVICE)
Hgb urine dipstick: NEGATIVE
Protein, ur: NEGATIVE mg/dL
Specific Gravity, Urine: 1.02 (ref 1.005–1.030)
Urobilinogen, UA: 0.2 mg/dL (ref 0.0–1.0)

## 2012-01-23 NOTE — Progress Notes (Signed)
Pulse 96 Has vaginal irritation and itching.

## 2012-01-23 NOTE — Progress Notes (Signed)
Tina Richardson  was seen today for an ultrasound appointment.  See full report in [facility].  Single IUP at 37 4/7 weeks Follow up due to poorly compliant Gestational diabetes The estimated fetal weight today is > 90th %tile (3701g) Normal amniotic fluid volume  Recommend 2x weekly NSTs with weekly AFIs.  Recommend delivery at 39 weeks or sooner if clinically indictated.  Alpha Gula, MD

## 2012-01-23 NOTE — Progress Notes (Signed)
[place] growth done today

## 2012-01-23 NOTE — Progress Notes (Signed)
NST 12-20 reactive 

## 2012-01-23 NOTE — Progress Notes (Signed)
NST reactive.  Pt did not bring CBG log.  Fasting--80-90; post prandial usually less than 120.  LGA fetus.  8 pounds 3 oz at MFM today.  Pt has had 8 pounds 11 ounces biggest with no problems.  Wants Mirena.  CBG=89 random.  Cultures done today.

## 2012-01-24 ENCOUNTER — Ambulatory Visit (HOSPITAL_COMMUNITY): Payer: Medicaid Other

## 2012-01-25 ENCOUNTER — Encounter (HOSPITAL_COMMUNITY): Payer: Self-pay

## 2012-01-25 ENCOUNTER — Inpatient Hospital Stay (HOSPITAL_COMMUNITY)
Admission: AD | Admit: 2012-01-25 | Discharge: 2012-01-25 | Disposition: A | Discharge status: Home / Self Care | Payer: Medicaid Other | Source: Ambulatory Visit | Attending: Obstetrics & Gynecology | Admitting: Obstetrics & Gynecology

## 2012-01-25 DIAGNOSIS — O09529 Supervision of elderly multigravida, unspecified trimester: Secondary | ICD-10-CM | POA: Insufficient documentation

## 2012-01-25 DIAGNOSIS — O99810 Abnormal glucose complicating pregnancy: Secondary | ICD-10-CM | POA: Insufficient documentation

## 2012-01-25 DIAGNOSIS — E669 Obesity, unspecified: Secondary | ICD-10-CM | POA: Insufficient documentation

## 2012-01-25 DIAGNOSIS — O479 False labor, unspecified: Secondary | ICD-10-CM | POA: Insufficient documentation

## 2012-01-25 DIAGNOSIS — O3660X0 Maternal care for excessive fetal growth, unspecified trimester, not applicable or unspecified: Secondary | ICD-10-CM | POA: Insufficient documentation

## 2012-01-25 DIAGNOSIS — O10019 Pre-existing essential hypertension complicating pregnancy, unspecified trimester: Secondary | ICD-10-CM | POA: Insufficient documentation

## 2012-01-25 DIAGNOSIS — O471 False labor at or after 37 completed weeks of gestation: Secondary | ICD-10-CM

## 2012-01-25 HISTORY — DX: Gestational diabetes mellitus in pregnancy, unspecified control: O24.419

## 2012-01-25 LAB — GLUCOSE, CAPILLARY: Glucose-Capillary: 91 mg/dL (ref 70–99)

## 2012-01-25 NOTE — MAU Provider Note (Signed)
History     CSN: [id number]  Arrival date and time: 01/25/12 8295   First Provider Initiated Contact with Patient 01/25/12 2057      Chief Complaint  Patient presents with  . Labor Eval   HPI Ms. Tina Richardson is a 42 year old G8P7007 at 37 weeks and 3 days gestation by LMP presenting for dark, bloody vaginal discharge.  She reports she was checked by Dr. Penne Lash on Monday and has noticed a mucousy bloody discharge since Tuesday.  She states that now the discharge is brownish, cloudy and clumpy.  No complaints of itching, burning or irritation.  No LOF.  OB History    Grav Para Term Preterm Abortions TAB SAB Ect Mult Living   8 7 7       7       Past Medical History  Diagnosis Date  . Hypertension   . Anxiety   . Multiparity   . High-risk pregnancy   . Gestational diabetes     diet controlled per patient    Past Surgical History  Procedure Date  . No past surgeries     Family History  Problem Relation Age of Onset  . Diabetes Mother   . Hypertension Father   . Diabetes Father   . COPD Father   . Asthma Father   . Other Neg Hx     History  Substance Use Topics  . Smoking status: Never Smoker   . Smokeless tobacco: Never Used  . Alcohol Use: No     Comment: none since conception    Allergies:  Allergies  Allergen Reactions  . Dilaudid (Hydromorphone Hcl) Other (See Comments)    Could not stop shaking    Prescriptions prior to admission  Medication Sig Dispense Refill  . metroNIDAZOLE (FLAGYL) 500 MG tablet Take 500 mg by mouth 3 (three) times daily.        Review of Systems  Constitutional: Negative.   HENT: Negative.   Eyes: Negative.   Respiratory: Negative.   Cardiovascular: Negative.   Gastrointestinal: Negative.   Genitourinary:       Increased brownish vaginal discharge  Musculoskeletal: Negative.   Skin: Negative.   Neurological: Negative.   Endo/Heme/Allergies: Negative.   Psychiatric/Behavioral: Negative.    Physical Exam   Blood  pressure 125/82, pulse 87, temperature 98.4 F (36.9 C), temperature source Oral, resp. rate 18, height 5' 6.5" (1.689 m), weight 114.987 kg (253 lb 8 oz), last menstrual period 05/08/2011.  Physical Exam  Constitutional: She is oriented to person, place, and time. She appears well-developed and well-nourished.  HENT:  Head: Normocephalic and atraumatic.  Right Ear: External ear normal.  Left Ear: External ear normal.  Nose: Nose normal.  Mouth/Throat: Oropharynx is clear and moist.  Eyes: Conjunctivae normal and EOM are normal.  Neck: Normal range of motion. Neck supple.  Cardiovascular: Normal rate, regular rhythm, normal heart sounds and intact distal pulses.   Respiratory: Effort normal and breath sounds normal.  GI: Soft. Bowel sounds are normal.  Genitourinary: Vagina normal and uterus normal.       gravid  Musculoskeletal: Normal range of motion.  Neurological: She is alert and oriented to person, place, and time. She has normal reflexes.  Skin: Skin is warm and dry.  Psychiatric: She has a normal mood and affect. Her behavior is normal. Judgment and thought content normal.    MAU Course  Procedures EFM: FHR: 130 bpm, moderate variability, accels present, no decels present  Toco: Occ UC's with UI   Assessment and Plan  42 year old G8P7007, IUP at 93.[redacted] weeks gestation AMA (advanced maternal age) multigravida 35+  GDM LGA [place] Multip[place] Obesity HTN  Fibroids  False Labor Normal Leukorrhea  Discharge home with False Labor instructions Keep scheduled appointment with [facility] on 01/26/12  Raelyn Mora, SNM 01/25/2012, 9:20 PM Consulted with and supervised by: Wynelle Bourgeois, CNM

## 2012-01-25 NOTE — MAU Note (Signed)
Patient is in with c/o irregular contractions all day with dark bloody show. She denies lof. Reports good fetal movement.

## 2012-01-26 ENCOUNTER — Ambulatory Visit (INDEPENDENT_AMBULATORY_CARE_PROVIDER_SITE_OTHER): Payer: Medicaid Other | Admitting: *Deleted

## 2012-01-26 VITALS — BP 120/82 | Wt 258.1 lb

## 2012-01-26 DIAGNOSIS — O3660X0 Maternal care for excessive fetal growth, unspecified trimester, not applicable or unspecified: Secondary | ICD-10-CM

## 2012-01-26 DIAGNOSIS — O99810 Abnormal glucose complicating pregnancy: Secondary | ICD-10-CM

## 2012-01-26 NOTE — Progress Notes (Signed)
NST reviewed and reactive.  Tymara Saur L. Harraway-Smith, M.D., FACOG    

## 2012-01-26 NOTE — MAU Provider Note (Signed)
Seen and agree with note Astou Lada CNM 

## 2012-01-26 NOTE — Progress Notes (Signed)
P = 91   Pt seen last pm @ MAU for dk brown vag d/c- still having.  Pt reassured that this can be normal following the vaginal exam she had 3 days ago. Pt denies bright red bleeding or thin fluid leaking from vagina.  Labor sx reviewed.

## 2012-01-30 ENCOUNTER — Ambulatory Visit (INDEPENDENT_AMBULATORY_CARE_PROVIDER_SITE_OTHER): Payer: Medicaid Other | Admitting: Obstetrics and Gynecology

## 2012-01-30 ENCOUNTER — Encounter: Payer: Self-pay | Admitting: Obstetrics and Gynecology

## 2012-01-30 VITALS — BP 129/87 | Temp 98.0°F | Wt 256.4 lb

## 2012-01-30 DIAGNOSIS — O09529 Supervision of elderly multigravida, unspecified trimester: Secondary | ICD-10-CM

## 2012-01-30 DIAGNOSIS — R030 Elevated blood-pressure reading, without diagnosis of hypertension: Secondary | ICD-10-CM

## 2012-01-30 DIAGNOSIS — O99210 Obesity complicating pregnancy, unspecified trimester: Secondary | ICD-10-CM

## 2012-01-30 DIAGNOSIS — O99810 Abnormal glucose complicating pregnancy: Secondary | ICD-10-CM

## 2012-01-30 DIAGNOSIS — E669 Obesity, unspecified: Secondary | ICD-10-CM

## 2012-01-30 DIAGNOSIS — O3660X0 Maternal care for excessive fetal growth, unspecified trimester, not applicable or unspecified: Secondary | ICD-10-CM

## 2012-01-30 DIAGNOSIS — O409XX0 Polyhydramnios, unspecified trimester, not applicable or unspecified: Secondary | ICD-10-CM

## 2012-01-30 DIAGNOSIS — Z641 Problems related to multiparity: Secondary | ICD-10-CM

## 2012-01-30 LAB — POCT URINALYSIS DIP (DEVICE)
Bilirubin Urine: NEGATIVE
Glucose, UA: NEGATIVE mg/dL
Nitrite: NEGATIVE

## 2012-01-30 NOTE — Progress Notes (Signed)
Pulse: 100

## 2012-01-30 NOTE — Addendum Note (Signed)
Addended by: Catalina Antigua on: 01/30/2012 10:03 AM   Modules accepted: Orders

## 2012-01-30 NOTE — Progress Notes (Signed)
NST reviewed and reactive. Patient doing well without complaints. Did not bring CBG log but reports fasting as high as 89 and 2hr postprandial as high as 125. Reviewed ultrasound results with patient.

## 2012-01-31 ENCOUNTER — Encounter: Payer: Self-pay | Admitting: *Deleted

## 2012-02-01 ENCOUNTER — Encounter: Payer: Self-pay | Admitting: *Deleted

## 2012-02-02 ENCOUNTER — Encounter: Payer: Self-pay | Admitting: Obstetrics and Gynecology

## 2012-02-02 ENCOUNTER — Ambulatory Visit (INDEPENDENT_AMBULATORY_CARE_PROVIDER_SITE_OTHER): Payer: Medicaid Other | Admitting: *Deleted

## 2012-02-02 VITALS — BP 119/78 | Wt 254.6 lb

## 2012-02-02 DIAGNOSIS — B951 Streptococcus, group B, as the cause of diseases classified elsewhere: Secondary | ICD-10-CM

## 2012-02-02 DIAGNOSIS — O169 Unspecified maternal hypertension, unspecified trimester: Secondary | ICD-10-CM

## 2012-02-02 DIAGNOSIS — O2340 Unspecified infection of urinary tract in pregnancy, unspecified trimester: Secondary | ICD-10-CM | POA: Insufficient documentation

## 2012-02-02 DIAGNOSIS — O99810 Abnormal glucose complicating pregnancy: Secondary | ICD-10-CM

## 2012-02-02 DIAGNOSIS — O409XX0 Polyhydramnios, unspecified trimester, not applicable or unspecified: Secondary | ICD-10-CM

## 2012-02-02 LAB — CULTURE, OB URINE: Colony Count: 60000

## 2012-02-02 LAB — OB RESULTS CONSOLE GBS: GBS: POSITIVE

## 2012-02-02 MED ORDER — CEPHALEXIN 500 MG PO CAPS: 500.0000 mg | ORAL_CAPSULE | Freq: Four times a day (QID) | ORAL | Status: DC

## 2012-02-02 NOTE — Addendum Note (Signed)
Addended by: Catalina Antigua on: 02/02/2012 09:36 AM   Modules accepted: Orders

## 2012-02-02 NOTE — Progress Notes (Signed)
P = 90   Pt informed of +UTI and need for medication. She agrees to pick up Rx today and take as directed.

## 2012-02-05 ENCOUNTER — Inpatient Hospital Stay (HOSPITAL_COMMUNITY): Payer: Medicaid Other

## 2012-02-06 ENCOUNTER — Encounter (HOSPITAL_COMMUNITY): Payer: Self-pay | Admitting: Anesthesiology

## 2012-02-06 ENCOUNTER — Inpatient Hospital Stay (HOSPITAL_COMMUNITY)
Admission: AD | Admit: 2012-02-06 | Discharge: 2012-02-10 | DRG: 765 | Disposition: A | Discharge status: Home / Self Care | Payer: Medicaid Other | Source: Ambulatory Visit | Attending: Obstetrics & Gynecology | Admitting: Obstetrics & Gynecology

## 2012-02-06 ENCOUNTER — Encounter (HOSPITAL_COMMUNITY)
Admission: AD | Disposition: A | Discharge status: Home / Self Care | Payer: Self-pay | Source: Ambulatory Visit | Attending: Obstetrics & Gynecology

## 2012-02-06 ENCOUNTER — Ambulatory Visit (INDEPENDENT_AMBULATORY_CARE_PROVIDER_SITE_OTHER): Payer: Medicaid Other | Admitting: Obstetrics & Gynecology

## 2012-02-06 ENCOUNTER — Inpatient Hospital Stay (HOSPITAL_COMMUNITY): Payer: Medicaid Other | Admitting: Anesthesiology

## 2012-02-06 ENCOUNTER — Encounter (HOSPITAL_COMMUNITY): Payer: Self-pay

## 2012-02-06 VITALS — BP 132/85 | Temp 97.4°F | Wt 253.2 lb

## 2012-02-06 DIAGNOSIS — O1002 Pre-existing essential hypertension complicating childbirth: Secondary | ICD-10-CM | POA: Diagnosis present

## 2012-02-06 DIAGNOSIS — O99810 Abnormal glucose complicating pregnancy: Secondary | ICD-10-CM

## 2012-02-06 DIAGNOSIS — Z641 Problems related to multiparity: Secondary | ICD-10-CM | POA: Diagnosis present

## 2012-02-06 DIAGNOSIS — Z22330 Carrier of Group B streptococcus: Secondary | ICD-10-CM

## 2012-02-06 DIAGNOSIS — O09529 Supervision of elderly multigravida, unspecified trimester: Secondary | ICD-10-CM | POA: Diagnosis present

## 2012-02-06 DIAGNOSIS — B951 Streptococcus, group B, as the cause of diseases classified elsewhere: Secondary | ICD-10-CM | POA: Diagnosis present

## 2012-02-06 DIAGNOSIS — O368390 Maternal care for abnormalities of the fetal heart rate or rhythm, unspecified trimester, not applicable or unspecified: Secondary | ICD-10-CM

## 2012-02-06 DIAGNOSIS — Z98891 History of uterine scar from previous surgery: Secondary | ICD-10-CM

## 2012-02-06 DIAGNOSIS — O169 Unspecified maternal hypertension, unspecified trimester: Secondary | ICD-10-CM

## 2012-02-06 DIAGNOSIS — O99210 Obesity complicating pregnancy, unspecified trimester: Secondary | ICD-10-CM | POA: Diagnosis present

## 2012-02-06 DIAGNOSIS — O324XX0 Maternal care for high head at term, not applicable or unspecified: Secondary | ICD-10-CM | POA: Diagnosis present

## 2012-02-06 DIAGNOSIS — O0990 Supervision of high risk pregnancy, unspecified, unspecified trimester: Secondary | ICD-10-CM

## 2012-02-06 DIAGNOSIS — O409XX0 Polyhydramnios, unspecified trimester, not applicable or unspecified: Secondary | ICD-10-CM

## 2012-02-06 DIAGNOSIS — O99814 Abnormal glucose complicating childbirth: Secondary | ICD-10-CM | POA: Diagnosis present

## 2012-02-06 DIAGNOSIS — O99892 Other specified diseases and conditions complicating childbirth: Secondary | ICD-10-CM | POA: Diagnosis present

## 2012-02-06 DIAGNOSIS — O3660X0 Maternal care for excessive fetal growth, unspecified trimester, not applicable or unspecified: Secondary | ICD-10-CM | POA: Diagnosis present

## 2012-02-06 LAB — GLUCOSE, CAPILLARY
Glucose-Capillary: 95 mg/dL (ref 70–99)
Glucose-Capillary: 121 mg/dL — ABNORMAL HIGH (ref 70–99)

## 2012-02-06 LAB — CBC
HCT: 38.1 % (ref 36.0–46.0)
Hemoglobin: 12.6 g/dL (ref 12.0–15.0)
MCH: 28.8 pg (ref 26.0–34.0)
MCV: 87 fL (ref 78.0–100.0)
RBC: 4.38 MIL/uL (ref 3.87–5.11)
WBC: 8.7 10*3/uL (ref 4.0–10.5)

## 2012-02-06 LAB — POCT URINALYSIS DIP (DEVICE)
Glucose, UA: NEGATIVE mg/dL
Nitrite: NEGATIVE
Protein, ur: NEGATIVE mg/dL
Specific Gravity, Urine: >=1.03 (ref 1.005–1.030)
Urobilinogen, UA: 1 mg/dL (ref 0.0–1.0)
pH: 5.5 (ref 5.0–8.0)

## 2012-02-06 SURGERY — Surgical Case
Anesthesia: Epidural | Site: Abdomen | Wound class: Clean Contaminated

## 2012-02-06 MED ORDER — FENTANYL 2.5 MCG/ML BUPIVACAINE 1/10 % EPIDURAL INFUSION (WH - ANES)
14.0000 mL/h | INTRAMUSCULAR | Status: DC
  Administered 2012-02-06: 14 mL/h via EPIDURAL
  Filled 2012-02-06: qty 125

## 2012-02-06 MED ORDER — KETOROLAC TROMETHAMINE 30 MG/ML IJ SOLN: 30.0000 mg | Freq: Four times a day (QID) | INTRAMUSCULAR | Status: DC | PRN

## 2012-02-06 MED ORDER — ACETAMINOPHEN 325 MG PO TABS: 650.0000 mg | ORAL_TABLET | ORAL | Status: DC | PRN

## 2012-02-06 MED ORDER — OXYCODONE-ACETAMINOPHEN 5-325 MG PO TABS: 1.0000 | ORAL_TABLET | ORAL | Status: DC | PRN

## 2012-02-06 MED ORDER — LIDOCAINE HCL (PF) 1 % IJ SOLN
30.0000 mL | INTRAMUSCULAR | Status: DC | PRN
  Filled 2012-02-06: qty 30

## 2012-02-06 MED ORDER — OXYTOCIN BOLUS FROM INFUSION: 500.0000 mL | INTRAVENOUS | Status: DC

## 2012-02-06 MED ORDER — TERBUTALINE SULFATE 1 MG/ML IJ SOLN
0.2500 mg | Freq: Once | INTRAMUSCULAR | Status: AC | PRN
  Administered 2012-02-06: 0.25 mg via SUBCUTANEOUS
  Filled 2012-02-06: qty 1

## 2012-02-06 MED ORDER — LACTATED RINGERS IV SOLN
INTRAVENOUS | Status: DC | PRN
  Administered 2012-02-06 – 2012-02-07 (×3): via INTRAVENOUS

## 2012-02-06 MED ORDER — OXYTOCIN 10 UNIT/ML IJ SOLN
INTRAMUSCULAR | Status: AC
  Filled 2012-02-06: qty 4

## 2012-02-06 MED ORDER — BUPIVACAINE HCL (PF) 0.5 % IJ SOLN
INTRAMUSCULAR | Status: AC
  Filled 2012-02-06: qty 30

## 2012-02-06 MED ORDER — EPHEDRINE 5 MG/ML INJ
10.0000 mg | INTRAVENOUS | Status: DC | PRN
  Filled 2012-02-06: qty 4

## 2012-02-06 MED ORDER — LIDOCAINE HCL (PF) 1 % IJ SOLN
INTRAMUSCULAR | Status: DC | PRN
  Administered 2012-02-06 (×4): 4 mL

## 2012-02-06 MED ORDER — LACTATED RINGERS IV SOLN
INTRAVENOUS | Status: DC
  Administered 2012-02-06 (×2): via INTRAVENOUS

## 2012-02-06 MED ORDER — TERBUTALINE SULFATE 1 MG/ML IJ SOLN: 0.2500 mg | Freq: Once | INTRAMUSCULAR | Status: DC

## 2012-02-06 MED ORDER — CITRIC ACID-SODIUM CITRATE 334-500 MG/5ML PO SOLN
ORAL | Status: AC
  Administered 2012-02-06: 30 mL
  Filled 2012-02-06: qty 15

## 2012-02-06 MED ORDER — LIDOCAINE-EPINEPHRINE (PF) 2 %-1:200000 IJ SOLN
INTRAMUSCULAR | Status: AC
  Filled 2012-02-06: qty 20

## 2012-02-06 MED ORDER — ONDANSETRON HCL 4 MG/2ML IJ SOLN
INTRAMUSCULAR | Status: AC
  Filled 2012-02-06: qty 2

## 2012-02-06 MED ORDER — LACTATED RINGERS IV SOLN
500.0000 mL | Freq: Once | INTRAVENOUS | Status: AC
  Administered 2012-02-06: 500 mL via INTRAVENOUS

## 2012-02-06 MED ORDER — CEFAZOLIN SODIUM-DEXTROSE 2-3 GM-% IV SOLR
INTRAVENOUS | Status: AC
  Filled 2012-02-06: qty 50

## 2012-02-06 MED ORDER — OXYTOCIN 40 UNITS IN LACTATED RINGERS INFUSION - SIMPLE MED
1.0000 m[IU]/min | INTRAVENOUS | Status: DC
  Administered 2012-02-06: 2 m[IU]/min via INTRAVENOUS
  Administered 2012-02-06: 4 m[IU]/min via INTRAVENOUS

## 2012-02-06 MED ORDER — SODIUM BICARBONATE 8.4 % IV SOLN
INTRAVENOUS | Status: AC
  Filled 2012-02-06: qty 50

## 2012-02-06 MED ORDER — IBUPROFEN 600 MG PO TABS: 600.0000 mg | ORAL_TABLET | Freq: Four times a day (QID) | ORAL | Status: DC | PRN

## 2012-02-06 MED ORDER — CITRIC ACID-SODIUM CITRATE 334-500 MG/5ML PO SOLN
30.0000 mL | ORAL | Status: DC | PRN
  Administered 2012-02-06: 30 mL via ORAL

## 2012-02-06 MED ORDER — OXYTOCIN 40 UNITS IN LACTATED RINGERS INFUSION - SIMPLE MED
INTRAVENOUS | Status: AC
  Administered 2012-02-06: 2 m[IU]/min via INTRAVENOUS
  Filled 2012-02-06: qty 1000

## 2012-02-06 MED ORDER — PHENYLEPHRINE 40 MCG/ML (10ML) SYRINGE FOR IV PUSH (FOR BLOOD PRESSURE SUPPORT)
80.0000 ug | PREFILLED_SYRINGE | INTRAVENOUS | Status: DC | PRN
  Filled 2012-02-06: qty 5

## 2012-02-06 MED ORDER — KETOROLAC TROMETHAMINE 30 MG/ML IJ SOLN
30.0000 mg | Freq: Four times a day (QID) | INTRAMUSCULAR | Status: DC | PRN
  Administered 2012-02-07: 30 mg via INTRAMUSCULAR

## 2012-02-06 MED ORDER — ONDANSETRON HCL 4 MG/2ML IJ SOLN: 4.0000 mg | Freq: Four times a day (QID) | INTRAMUSCULAR | Status: DC | PRN

## 2012-02-06 MED ORDER — SODIUM BICARBONATE 8.4 % IV SOLN
INTRAVENOUS | Status: DC | PRN
  Administered 2012-02-06: 5 mL via EPIDURAL

## 2012-02-06 MED ORDER — FLEET ENEMA 7-19 GM/118ML RE ENEM: 1.0000 | ENEMA | RECTAL | Status: DC | PRN

## 2012-02-06 MED ORDER — PHENYLEPHRINE 40 MCG/ML (10ML) SYRINGE FOR IV PUSH (FOR BLOOD PRESSURE SUPPORT): 80.0000 ug | PREFILLED_SYRINGE | INTRAVENOUS | Status: DC | PRN

## 2012-02-06 MED ORDER — LACTATED RINGERS IV SOLN
500.0000 mL | INTRAVENOUS | Status: DC | PRN
  Administered 2012-02-06: 1000 mL via INTRAVENOUS

## 2012-02-06 MED ORDER — MORPHINE SULFATE 0.5 MG/ML IJ SOLN
INTRAMUSCULAR | Status: AC
  Filled 2012-02-06: qty 10

## 2012-02-06 MED ORDER — LACTATED RINGERS IV SOLN
INTRAVENOUS | Status: DC
  Administered 2012-02-06: 19:00:00 via INTRAUTERINE

## 2012-02-06 MED ORDER — MEPERIDINE HCL 25 MG/ML IJ SOLN
6.2500 mg | INTRAMUSCULAR | Status: DC | PRN
  Administered 2012-02-07: 6.25 mg via INTRAVENOUS

## 2012-02-06 MED ORDER — PENICILLIN G POTASSIUM 5000000 UNITS IJ SOLR
2.5000 10*6.[IU] | INTRAVENOUS | Status: DC
  Administered 2012-02-06 (×2): 2.5 10*6.[IU] via INTRAVENOUS
  Filled 2012-02-06 (×7): qty 2.5

## 2012-02-06 MED ORDER — SCOPOLAMINE 1 MG/3DAYS TD PT72
1.0000 | MEDICATED_PATCH | Freq: Once | TRANSDERMAL | Status: DC
  Administered 2012-02-07: 1.5 mg via TRANSDERMAL

## 2012-02-06 MED ORDER — PENICILLIN G POTASSIUM 5000000 UNITS IJ SOLR
5.0000 10*6.[IU] | Freq: Once | INTRAVENOUS | Status: AC
  Administered 2012-02-06: 5 10*6.[IU] via INTRAVENOUS
  Filled 2012-02-06: qty 5

## 2012-02-06 MED ORDER — DIPHENHYDRAMINE HCL 50 MG/ML IJ SOLN: 12.5000 mg | INTRAMUSCULAR | Status: DC | PRN

## 2012-02-06 MED ORDER — OXYTOCIN 40 UNITS IN LACTATED RINGERS INFUSION - SIMPLE MED: 62.5000 mL/h | INTRAVENOUS | Status: DC

## 2012-02-06 MED ORDER — EPHEDRINE 5 MG/ML INJ: 10.0000 mg | INTRAVENOUS | Status: DC | PRN

## 2012-02-06 MED ORDER — CEFAZOLIN SODIUM-DEXTROSE 2-3 GM-% IV SOLR
INTRAVENOUS | Status: DC | PRN
  Administered 2012-02-06: 2 g via INTRAVENOUS

## 2012-02-06 MED ORDER — FENTANYL CITRATE 0.05 MG/ML IJ SOLN: 25.0000 ug | INTRAMUSCULAR | Status: DC | PRN

## 2012-02-06 SURGICAL SUPPLY — 35 items
CLOTH BEACON ORANGE TIMEOUT ST (SAFETY) ×2 IMPLANT
DRAPE LG THREE QUARTER DISP (DRAPES) IMPLANT
DRESSING TELFA 8X3 (GAUZE/BANDAGES/DRESSINGS) IMPLANT
DRSG OPSITE POSTOP 4X10 (GAUZE/BANDAGES/DRESSINGS) ×2 IMPLANT
DURAPREP 26ML APPLICATOR (WOUND CARE) ×2 IMPLANT
ELECT REM PT RETURN 9FT ADLT (ELECTROSURGICAL) ×2
ELECTRODE REM PT RTRN 9FT ADLT (ELECTROSURGICAL) ×1 IMPLANT
EXTRACTOR VACUUM M CUP 4 TUBE (SUCTIONS) IMPLANT
GAUZE SPONGE 4X4 12PLY STRL LF (GAUZE/BANDAGES/DRESSINGS) IMPLANT
GLOVE BIO SURGEON STRL SZ7 (GLOVE) ×2 IMPLANT
GLOVE BIOGEL PI IND STRL 7.0 (GLOVE) ×3 IMPLANT
GLOVE BIOGEL PI INDICATOR 7.0 (GLOVE) ×3
GOWN STRL REIN XL XLG (GOWN DISPOSABLE) ×6 IMPLANT
KIT ABG SYR 3ML LUER SLIP (SYRINGE) IMPLANT
NEEDLE HYPO 22GX1.5 SAFETY (NEEDLE) ×2 IMPLANT
NEEDLE HYPO 25X5/8 SAFETYGLIDE (NEEDLE) ×2 IMPLANT
NS IRRIG 1000ML POUR BTL (IV SOLUTION) ×2 IMPLANT
PACK C SECTION WH (CUSTOM PROCEDURE TRAY) ×2 IMPLANT
PAD ABD 7.5X8 STRL (GAUZE/BANDAGES/DRESSINGS) ×2 IMPLANT
PAD OB MATERNITY 4.3X12.25 (PERSONAL CARE ITEMS) ×2 IMPLANT
RTRCTR C-SECT PINK 25CM LRG (MISCELLANEOUS) ×2 IMPLANT
SLEEVE SCD COMPRESS KNEE LRG (MISCELLANEOUS) IMPLANT
SLEEVE SCD COMPRESS KNEE MED (MISCELLANEOUS) IMPLANT
STAPLER VISISTAT 35W (STAPLE) IMPLANT
SUT PDS AB 0 CT1 27 (SUTURE) IMPLANT
SUT PDS AB 0 CTX 36 PDP370T (SUTURE) IMPLANT
SUT PDS AB 0 CTX 60 (SUTURE) ×2 IMPLANT
SUT VIC AB 0 CT1 36 (SUTURE) ×4 IMPLANT
SUT VIC AB 0 CTX 36 (SUTURE) ×2
SUT VIC AB 0 CTX36XBRD ANBCTRL (SUTURE) ×2 IMPLANT
SUT VIC AB 4-0 KS 27 (SUTURE) ×2 IMPLANT
SYR CONTROL 10ML LL (SYRINGE) ×2 IMPLANT
TOWEL OR 17X24 6PK STRL BLUE (TOWEL DISPOSABLE) ×6 IMPLANT
TRAY FOLEY CATH 14FR (SET/KITS/TRAYS/PACK) IMPLANT
WATER STERILE IRR 1000ML POUR (IV SOLUTION) IMPLANT

## 2012-02-06 NOTE — Progress Notes (Signed)
Pulse- 85 Patient reports pain & pressure in vagina and back and occasional contractions; also reports green mucous d/c with itching. States that since she has started keflex she has developed a rash and feels like her UTI has been worse

## 2012-02-06 NOTE — Progress Notes (Signed)
Faculty Practice OB/GYN Attending (late entry)  Subjective:  Called to evaluate patient with deep FHR decelerations. Intrauterine neonatal resuscitation efforts are underway including amnioinfusion and one dose of terbutaline.   Objective:  Blood pressure 128/65, pulse 92, temperature 99.1 F (37.3 C), temperature source Oral, resp. rate 18, height 5' 6.5" (1.689 m), weight 253 lb (114.76 kg), last menstrual period 05/08/2011, SpO2 98.00%. FHT  Baseline 150 bpm, moderate variability, no accelerations,  Variable decelerations to nadir of 90 lasting about 1.5 minutes, every 3-4 minutes associated with contractions Toco: q 3-4 minutes Cervix: 8/100/0; cervix mostly on her right, very floppy and reducible with pushing  Assessment & Plan:  Attempted pushing while reducing the cervix during a few contractions, lips did reduce but was present after each contraction.  It was also noted to become edematous after a few pushes.  Patient was told to stop pushing, FHR tracing observed and did not have the deep variables as before, baseline in 150s with moderate variability, no accelerations, mild variable decelerations.  Patient was turned to her left side and will rest for now as long as the FHR remains stable. Will reevaluate soon and start pushing again when she is fully dilated.  Hopeful for SVD.   Jaynie Collins, MD, FACOG Attending Obstetrician & Gynecologist Faculty Practice, [facility] of [place]

## 2012-02-06 NOTE — Progress Notes (Signed)
Tina Richardson is a 42 y.o. [phone] at [redacted]w[redacted]d by ultrasound admitted for induction of labor due to Early labor, Gestational diabetes with LGA  And Polyhydramnios, and hypertension. Seen in clinic with Dr Penne Lash. .  Subjective: Comfortable with epidural   Objective: BP 132/73  Pulse 91  Temp 99.1 F (37.3 C) (Oral)  Resp 18  Ht 5' 6.5" (1.689 m)  Wt 114.76 kg (253 lb)  BMI 40.22 kg/m2  SpO2 97%  LMP 05/08/2011      FHT:  FHR: 150 bpm, variability: moderate,  accelerations:  Abscent,  decelerations:  Present variability UC:   regular, every 2 minutes SVE:   Dilation: 5 Effacement (%): 90 Station: -1 Exam by:: Montez Morita, RNC  Labs: Lab Results  Component Value Date   WBC 8.7 02/06/2012   HGB 12.6 02/06/2012   HCT 38.1 02/06/2012   MCV 87.0 02/06/2012   PLT 226 02/06/2012    Assessment / Plan: Induction of labor due to gestational hypertension,  progressing well on pitocin  AROM bloody fluid Cervix 6-7cm/90/0 IUPC and FSE placed.  Labor: Progressing normally Preeclampsia:   Fetal Wellbeing:  Category II Pain Control:  Epidural I/D:  n/a Anticipated MOD:  NSVD  Mescal Flinchbaugh 02/06/2012, 7:20 PM

## 2012-02-06 NOTE — Progress Notes (Signed)
Patient ID: Tina Richardson, female   DOB: Jul 08, 1970, 42 y.o.   MRN: [id number]  FHR decels continue  Amnioinfusion ordered, Terbutaline ordered to rest baby  Baby's head comes down with contractions, even though cervix is not dilated. Discussed with Dr Macon Large, will reduce cervix and try to push

## 2012-02-06 NOTE — Anesthesia Procedure Notes (Signed)
Epidural Patient location during procedure: OB Start time: 02/06/2012 5:44 PM  Staffing Performed by: anesthesiologist   Preanesthetic Checklist Completed: patient identified, site marked, surgical consent, pre-op evaluation, timeout performed, IV checked, risks and benefits discussed and monitors and equipment checked  Epidural Patient position: sitting Prep: site prepped and draped and DuraPrep Patient monitoring: continuous pulse ox and blood pressure Approach: midline Injection technique: LOR air  Needle:  Needle type: Tuohy  Needle gauge: 17 G Needle length: 9 cm and 9 Needle insertion depth: 6 cm Catheter type: closed end flexible Catheter size: 19 Gauge Catheter at skin depth: 11 cm Test dose: negative  Assessment Events: blood not aspirated, injection not painful, no injection resistance, negative IV test and no paresthesia  Additional Notes Discussed risk of headache, infection, bleeding, nerve injury and failed or incomplete block.  Patient voices understanding and wishes to proceed.  Epidural placed easily on first attempt.  Patient tolerated procedure well with no apparent complications.  Jasmine December, MD Reason for block:procedure for pain

## 2012-02-06 NOTE — H&P (Signed)
Tina Richardson is a 42 y.o. female presenting for induction of labor due to variable decelerations at her clinic this AM. Maternal Medical History:  Reason for admission: Reason for admission: nausea.  D cells at her clinic  Contractions: Onset was more than 2 days ago.   Frequency: irregular.   Duration is approximately 2 minutes.   Perceived severity is mild.    Fetal activity: Perceived fetal activity is normal.   Last perceived fetal movement was within the past hour.    Prenatal complications: Hypertension and polyhydramnios.   GDM-poorly controlled  Prenatal Complications - Diabetes: gestational. Diabetes is managed by diet.      OB History    Grav Para Term Preterm Abortions TAB SAB Ect Mult Living   8 7 7       7      Past Medical History  Diagnosis Date  . Hypertension   . Anxiety   . Multiparity   . High-risk pregnancy   . Gestational diabetes     diet controlled per patient   Past Surgical History  Procedure Date  . No past surgeries    Family History: family history includes Asthma in her father; COPD in her father; Diabetes in her father and mother; and Hypertension in her father.  There is no history of Other. Social History:  reports that she has never smoked. She has never used smokeless tobacco. She reports that she does not drink alcohol or use illicit drugs.   Prenatal Transfer Tool  Maternal Diabetes: Yes:  Diabetes Type:  Diet controlled Genetic Screening: Declined Maternal Ultrasounds/Referrals: Declined Fetal Ultrasounds or other Referrals:  None Maternal Substance Abuse:  No Significant Maternal Medications:  None Significant Maternal Lab Results:  None Other Comments:  None  Review of Systems  Constitutional: Negative.  Negative for fever and chills.  HENT: Negative.  Negative for tinnitus.   Eyes: Negative.  Negative for blurred vision.  Respiratory: Negative.   Cardiovascular: Negative.   Gastrointestinal: Positive for nausea and  constipation.  Genitourinary:       Light green discharge  Musculoskeletal: Negative.   Skin: Positive for rash (on both inner thighs).  Neurological: Negative.  Negative for headaches.  Endo/Heme/Allergies: Negative.   Psychiatric/Behavioral: Negative.       Blood pressure 130/86, pulse 88, temperature 98.5 F (36.9 C), temperature source Oral, resp. rate 18, height 5' 6.5" (1.689 m), weight 253 lb (114.76 kg), last menstrual period 05/08/2011. Maternal Exam:  Uterine Assessment: Contraction strength is mild.  Contraction duration is 2 minutes. Contraction frequency is irregular.   Introitus: Normal vulva. Normal vagina.  Ferning test: not done.  Nitrazine test: not done. Amniotic fluid character: not assessed.  Pelvis: adequate for delivery.      Physical Exam  Constitutional: She is oriented to person, place, and time. She appears well-developed and well-nourished.  Eyes: Pupils are equal, round, and reactive to light.  Neck: Normal range of motion. Neck supple.  Cardiovascular: Normal rate, regular rhythm, normal heart sounds and intact distal pulses.  Exam reveals no gallop and no friction rub.   No murmur heard. Respiratory: Effort normal and breath sounds normal.  GI: Bowel sounds are normal. There is no tenderness.  Genitourinary: Vagina normal.  Musculoskeletal: Normal range of motion.  Neurological: She is alert and oriented to person, place, and time. She has normal reflexes.  Skin: Skin is warm and dry. Rash (bilateral inner thigh white raised rash) noted.  Psychiatric: She has a normal mood and affect.  Prenatal labs: ABO, Rh: O/POS/-- (08/07 1136) Antibody: NEG (08/07 1136) Rubella: >500.0 (08/07 1136) RPR: NON REAC (08/07 1136)  HBsAg: NEGATIVE (08/07 1136)  HIV: NON REACTIVE (08/07 1136)  GBS: Positive (01/09 0000)   Assessment/Plan: A: High risk pregnancy due to GDM, obesity, AMA multigravida P: Admit for induction of labor.   Henningsgaard,  Elige Radon 02/06/2012, 12:37 PM    Attestation of Attending Supervision of Physician Assistant Student: Evaluation and management procedures were performed by the PA Student under my supervision.  I have seen and examined the patient, reviewed the the student's note and chart, and I agree with management and plan.  Jaynie Collins, MD, FACOG Attending Obstetrician & Gynecologist Faculty Practice, [facility] of [place]

## 2012-02-06 NOTE — Progress Notes (Signed)
Tina Richardson is a 42 y.o. [phone] at [redacted]w[redacted]d admitted for active labor.  Subjective: Patient was fully dilated, +2 around 2130 and has been pushing since then. No descent noted, and she exhibits excellent pushing effort.  EFW about 9 pounds.  Objective: BP 118/67  Pulse 100  Temp 99.8 F (37.7 C) (Oral)  Resp 20  Ht 5' 6.5" (1.689 m)  Wt 253 lb (114.76 kg)  BMI 40.22 kg/m2  SpO2 98%  LMP 05/08/2011   Total I/O In: -  Out: 75 [Urine:75]  FHT:  FHR: 150 bpm, variability: moderate,  accelerations:  Present,  decelerations:  Present variable UC:   regular, every 4-5 minutes SVE:   Dilation: 10 Effacement (%): 100 Station: +2 Exam by:: dr anyawu  Labs: Lab Results  Component Value Date   WBC 8.7 02/06/2012   HGB 12.6 02/06/2012   HCT 38.1 02/06/2012   MCV 87.0 02/06/2012   PLT 226 02/06/2012    Assessment / Plan: Arrest of descent after about two hours of pushing.  Patient was offered trial of vacuum assistance but informed of risks of cephalohematoma, shoulder dystocia.  Also discussed risks of cesarean section.  Patient opted for cesarean section. The risks of cesarean section discussed with the patient included but were not limited to: bleeding which may require transfusion or reoperation; infection which may require antibiotics; injury to bowel, bladder, ureters or other surrounding organs; injury to the fetus; need for additional procedures including hysterectomy in the event of a life-threatening hemorrhage; placental abnormalities wth subsequent pregnancies, incisional problems, thromboembolic phenomenon and other postoperative/anesthesia complications. The patient concurred with the proposed plan, giving informed written consent for the procedure. Preoperative prophylactic antibiotics and SCDs ordered. To OR when ready.  She has a high postpartum hemorrhage risk, pRBCs have been typed and crossmatched for her and will be in the OR.  Jaynie Collins, MD, FACOG  Attending  Obstetrician & Gynecologist  Faculty Practice, [facility] of [place]

## 2012-02-06 NOTE — Anesthesia Preprocedure Evaluation (Addendum)
Anesthesia Evaluation  Patient identified by MRN, date of birth, ID band Patient awake    Reviewed: Allergy & Precautions, H&P , NPO status , Patient's Chart, lab work & pertinent test results, reviewed documented beta blocker date and time   History of Anesthesia Complications Negative for: history of anesthetic complications  Airway Mallampati: III TM Distance: >3 FB Neck ROM: full    Dental No notable dental hx. (+) Teeth Intact   Pulmonary neg pulmonary ROS,  breath sounds clear to auscultation  Pulmonary exam normal       Cardiovascular hypertension, negative cardio ROS  Rhythm:regular Rate:Normal     Neuro/Psych Anxiety negative neurological ROS     GI/Hepatic negative GI ROS, Neg liver ROS,   Endo/Other  diabetes, Well Controlled, GestationalMorbid obesity  Renal/GU negative Renal ROS  negative genitourinary   Musculoskeletal   Abdominal (+) + obese,   Peds  Hematology negative hematology ROS (+)   Anesthesia Other Findings   Reproductive/Obstetrics (+) Pregnancy (G8P7) Grand Multiparity                          Anesthesia Physical Anesthesia Plan  ASA: III and emergent  Anesthesia Plan: Epidural   Post-op Pain Management:    Induction:   Airway Management Planned: Natural Airway  Additional Equipment:   Intra-op Plan:   Post-operative Plan:   Informed Consent: I have reviewed the patients History and Physical, chart, labs and discussed the procedure including the risks, benefits and alternatives for the proposed anesthesia with the patient or authorized representative who has indicated his/her understanding and acceptance.   Dental advisory given  Plan Discussed with: Anesthesiologist, CRNA and Surgeon  Anesthesia Plan Comments:        Anesthesia Quick Evaluation

## 2012-02-06 NOTE — MAU Note (Signed)
Pt sent from clinic is for direct admit.  L&D unable to take at this time, due to reason for admit, brought into MAU to be monitored.

## 2012-02-06 NOTE — Progress Notes (Addendum)
   Patient ID: Tina Richardson, female   DOB: 06/14/1970, 42 y.o.   MRN: [id number]  Doing well  Cervix 5/90/-1/vertex  FHR reactive, with variable decels, some late in timing.  RN has been changing positions but patient is difficult to move. O2 applied UCs every  2 minutes  Will continue to observe

## 2012-02-06 NOTE — MAU Note (Signed)
Patient is sent to directly admitted to l/d unit for non-reactive NST. She is MAU for monitoring until a room is available. She denies vaginal bleeding. Reports good fetal movement.

## 2012-02-06 NOTE — Progress Notes (Signed)
Pt did not bring CBG log.  Fasting per pt are all below 90.  Postprandials are all below 120.  CBG today =  Per Dr. Claudean Severance, delivery at 39 weeks.  Pt has a reactive FHT today however there were 2 variables.  Pt agrees for induction today.  Pt has bag with her.  Pt has had 2 NSVD with last 2 deliveries no shoulder dystocia.  Largest 8 pounds 11 ounces.

## 2012-02-07 ENCOUNTER — Encounter (HOSPITAL_COMMUNITY): Payer: Self-pay | Admitting: *Deleted

## 2012-02-07 ENCOUNTER — Encounter: Payer: Self-pay | Admitting: *Deleted

## 2012-02-07 LAB — CBC
Hemoglobin: 9.7 g/dL — ABNORMAL LOW (ref 12.0–15.0)
MCHC: 32.8 g/dL (ref 30.0–36.0)
Platelets: 209 10*3/uL (ref 150–400)
RDW: 15.6 % — ABNORMAL HIGH (ref 11.5–15.5)

## 2012-02-07 LAB — GLUCOSE, CAPILLARY
Glucose-Capillary: 119 mg/dL — ABNORMAL HIGH (ref 70–99)
Glucose-Capillary: 99 mg/dL (ref 70–99)

## 2012-02-07 MED ORDER — SCOPOLAMINE 1 MG/3DAYS TD PT72
MEDICATED_PATCH | TRANSDERMAL | Status: AC
  Administered 2012-02-07: 1.5 mg via TRANSDERMAL
  Filled 2012-02-07: qty 1

## 2012-02-07 MED ORDER — SIMETHICONE 80 MG PO CHEW
80.0000 mg | CHEWABLE_TABLET | ORAL | Status: DC | PRN
  Administered 2012-02-07 – 2012-02-08 (×3): 80 mg via ORAL

## 2012-02-07 MED ORDER — 0.9 % SODIUM CHLORIDE (POUR BTL) OPTIME
TOPICAL | Status: DC | PRN
  Administered 2012-02-07: 1000 mL

## 2012-02-07 MED ORDER — NALBUPHINE HCL 10 MG/ML IJ SOLN
5.0000 mg | INTRAMUSCULAR | Status: DC | PRN
  Filled 2012-02-07: qty 1

## 2012-02-07 MED ORDER — ONDANSETRON HCL 4 MG/2ML IJ SOLN
INTRAMUSCULAR | Status: DC | PRN
  Administered 2012-02-07: 4 mg via INTRAVENOUS

## 2012-02-07 MED ORDER — CEPHALEXIN 500 MG PO CAPS
500.0000 mg | ORAL_CAPSULE | Freq: Four times a day (QID) | ORAL | Status: DC
  Administered 2012-02-07 – 2012-02-10 (×13): 500 mg via ORAL
  Filled 2012-02-07 (×18): qty 1

## 2012-02-07 MED ORDER — IBUPROFEN 600 MG PO TABS
600.0000 mg | ORAL_TABLET | Freq: Four times a day (QID) | ORAL | Status: DC | PRN
  Filled 2012-02-07 (×9): qty 1

## 2012-02-07 MED ORDER — LACTATED RINGERS IV SOLN
INTRAVENOUS | Status: DC
  Administered 2012-02-07: 16:00:00 via INTRAVENOUS

## 2012-02-07 MED ORDER — MORPHINE SULFATE (PF) 0.5 MG/ML IJ SOLN
INTRAMUSCULAR | Status: DC | PRN
  Administered 2012-02-07: 4 mg via EPIDURAL

## 2012-02-07 MED ORDER — OXYTOCIN 10 UNIT/ML IJ SOLN
40.0000 [IU] | INTRAMUSCULAR | Status: DC | PRN
  Administered 2012-02-07: 40 [IU] via INTRAVENOUS

## 2012-02-07 MED ORDER — PRENATAL MULTIVITAMIN CH
1.0000 | ORAL_TABLET | Freq: Every day | ORAL | Status: DC
  Administered 2012-02-09 – 2012-02-10 (×2): 1 via ORAL
  Filled 2012-02-07 (×2): qty 1

## 2012-02-07 MED ORDER — DIPHENHYDRAMINE HCL 25 MG PO CAPS: 25.0000 mg | ORAL_CAPSULE | Freq: Four times a day (QID) | ORAL | Status: DC | PRN

## 2012-02-07 MED ORDER — ONDANSETRON HCL 4 MG/2ML IJ SOLN: 4.0000 mg | Freq: Three times a day (TID) | INTRAMUSCULAR | Status: DC | PRN

## 2012-02-07 MED ORDER — MENTHOL 3 MG MT LOZG
1.0000 | LOZENGE | OROMUCOSAL | Status: DC | PRN
  Administered 2012-02-09 (×2): 3 mg via ORAL
  Filled 2012-02-07: qty 9

## 2012-02-07 MED ORDER — MORPHINE SULFATE (PF) 0.5 MG/ML IJ SOLN
INTRAMUSCULAR | Status: DC | PRN
  Administered 2012-02-07: 1 mg via INTRAVENOUS

## 2012-02-07 MED ORDER — WITCH HAZEL-GLYCERIN EX PADS: 1.0000 "application " | MEDICATED_PAD | CUTANEOUS | Status: DC | PRN

## 2012-02-07 MED ORDER — BUPIVACAINE HCL (PF) 0.5 % IJ SOLN
INTRAMUSCULAR | Status: DC | PRN
  Administered 2012-02-07: 30 mL

## 2012-02-07 MED ORDER — DIPHENHYDRAMINE HCL 25 MG PO CAPS
25.0000 mg | ORAL_CAPSULE | ORAL | Status: DC | PRN
  Administered 2012-02-08: 25 mg via ORAL
  Filled 2012-02-07: qty 1

## 2012-02-07 MED ORDER — METOCLOPRAMIDE HCL 5 MG/ML IJ SOLN
10.0000 mg | Freq: Three times a day (TID) | INTRAMUSCULAR | Status: DC | PRN
  Administered 2012-02-07: 10 mg via INTRAVENOUS
  Filled 2012-02-07 (×2): qty 2

## 2012-02-07 MED ORDER — NALOXONE HCL 1 MG/ML IJ SOLN
1.0000 ug/kg/h | INTRAVENOUS | Status: DC | PRN
  Filled 2012-02-07: qty 2

## 2012-02-07 MED ORDER — MAGNESIUM HYDROXIDE 400 MG/5ML PO SUSP: 30.0000 mL | ORAL | Status: DC | PRN

## 2012-02-07 MED ORDER — TETANUS-DIPHTH-ACELL PERTUSSIS 5-2.5-18.5 LF-MCG/0.5 IM SUSP: 0.5000 mL | Freq: Once | INTRAMUSCULAR | Status: DC

## 2012-02-07 MED ORDER — OXYCODONE-ACETAMINOPHEN 5-325 MG PO TABS
1.0000 | ORAL_TABLET | ORAL | Status: DC | PRN
  Administered 2012-02-08 (×2): 1 via ORAL
  Administered 2012-02-08: 2 via ORAL
  Administered 2012-02-09 (×2): 1 via ORAL
  Filled 2012-02-07 (×2): qty 1
  Filled 2012-02-07: qty 2
  Filled 2012-02-07 (×2): qty 1

## 2012-02-07 MED ORDER — SENNOSIDES-DOCUSATE SODIUM 8.6-50 MG PO TABS
2.0000 | ORAL_TABLET | Freq: Every day | ORAL | Status: DC
  Administered 2012-02-07 – 2012-02-09 (×3): 2 via ORAL

## 2012-02-07 MED ORDER — KETOROLAC TROMETHAMINE 30 MG/ML IJ SOLN
INTRAMUSCULAR | Status: AC
  Administered 2012-02-07: 30 mg via INTRAMUSCULAR
  Filled 2012-02-07: qty 1

## 2012-02-07 MED ORDER — SODIUM CHLORIDE 0.9 % IJ SOLN: 3.0000 mL | INTRAMUSCULAR | Status: DC | PRN

## 2012-02-07 MED ORDER — ONDANSETRON HCL 4 MG/2ML IJ SOLN: 4.0000 mg | INTRAMUSCULAR | Status: DC | PRN

## 2012-02-07 MED ORDER — OXYTOCIN 40 UNITS IN LACTATED RINGERS INFUSION - SIMPLE MED: 62.5000 mL/h | INTRAVENOUS | Status: AC

## 2012-02-07 MED ORDER — MEPERIDINE HCL 25 MG/ML IJ SOLN
INTRAMUSCULAR | Status: AC
  Administered 2012-02-07: 6.25 mg via INTRAVENOUS
  Filled 2012-02-07: qty 1

## 2012-02-07 MED ORDER — LACTATED RINGERS IV SOLN
INTRAVENOUS | Status: DC | PRN
  Administered 2012-02-07: via INTRAVENOUS

## 2012-02-07 MED ORDER — DIBUCAINE 1 % RE OINT: 1.0000 "application " | TOPICAL_OINTMENT | RECTAL | Status: DC | PRN

## 2012-02-07 MED ORDER — SODIUM CHLORIDE 0.9 % IJ SOLN: 3.0000 mL | Freq: Two times a day (BID) | INTRAMUSCULAR | Status: DC

## 2012-02-07 MED ORDER — IBUPROFEN 600 MG PO TABS
600.0000 mg | ORAL_TABLET | Freq: Four times a day (QID) | ORAL | Status: DC
  Administered 2012-02-07 – 2012-02-10 (×12): 600 mg via ORAL
  Filled 2012-02-07 (×3): qty 1

## 2012-02-07 MED ORDER — DIPHENHYDRAMINE HCL 50 MG/ML IJ SOLN: 25.0000 mg | INTRAMUSCULAR | Status: DC | PRN

## 2012-02-07 MED ORDER — ONDANSETRON HCL 4 MG PO TABS: 4.0000 mg | ORAL_TABLET | ORAL | Status: DC | PRN

## 2012-02-07 MED ORDER — NALOXONE HCL 0.4 MG/ML IJ SOLN: 0.4000 mg | INTRAMUSCULAR | Status: DC | PRN

## 2012-02-07 MED ORDER — ZOLPIDEM TARTRATE 5 MG PO TABS: 5.0000 mg | ORAL_TABLET | Freq: Every evening | ORAL | Status: DC | PRN

## 2012-02-07 MED ORDER — LANOLIN HYDROUS EX OINT: 1.0000 "application " | TOPICAL_OINTMENT | CUTANEOUS | Status: DC | PRN

## 2012-02-07 MED ORDER — SODIUM CHLORIDE 0.9 % IV SOLN
250.0000 mL | INTRAVENOUS | Status: DC
  Administered 2012-02-07: 250 mL via INTRAVENOUS

## 2012-02-07 MED ORDER — DIPHENHYDRAMINE HCL 50 MG/ML IJ SOLN: 12.5000 mg | INTRAMUSCULAR | Status: DC | PRN

## 2012-02-07 NOTE — Progress Notes (Signed)
Pt O2 saturation 87-89 on room air.  Contacted M. Foster MD order received for 2 L O2.  Pt placed on 2L [place].  Pt O2 saturation increased to 95.  Dahlia Byes Boschen

## 2012-02-07 NOTE — Addendum Note (Signed)
Addendum  created 02/07/12 1808 by Caiden Arteaga R Ewing Fandino, CRNA   Modules edited:PRL Based Order Sets    

## 2012-02-07 NOTE — Anesthesia Postprocedure Evaluation (Signed)
  Anesthesia Post-op Note  Patient: Tina Richardson  Procedure(s) Performed: Procedure(s) (LRB) with comments: CESAREAN SECTION (N/A) - Primary Cesarean Section Delivery Baby Boy @ 0000, Apgars 8/9   Patient Location: PACU  Anesthesia Type:Epidural   Level of Consciousness: awake, alert  and oriented  Airway and Oxygen Therapy: Patient Spontanous Breathing  Post-op Pain: mild  Post-op Assessment: Post-op Vital signs reviewed, Patient's Cardiovascular Status Stable, Respiratory Function Stable, Patent Airway, No signs of Nausea or vomiting, Pain level controlled, No headache and No backache  Post-op Vital Signs: Reviewed and stable  Complications: No apparent anesthesia complications

## 2012-02-07 NOTE — Progress Notes (Signed)
Notified Dr. Alonna Buckler of Pts status. Pt has been extremely sleepy during the day today.  Pt has had nausea and vomitting. Taking in little po's.  Pts foley is still in as well as fluids still running.  Just now at 1600 able to stand pt at bedside.  Pt tolerated fairly.  Reported off to oncoming nurse.  Pt has no pain and bleeding is small.  See order management for new orders.

## 2012-02-07 NOTE — Anesthesia Postprocedure Evaluation (Signed)
  Anesthesia Post-op Note  Patient: Tina Richardson  Procedure(s) Performed: Procedure(s) (LRB) with comments: CESAREAN SECTION (N/A) - Primary Cesarean Section Delivery Baby Boy @ 0000, Apgars 8/9   Patient Location: Mother/Baby  Anesthesia Type:Regional  Level of Consciousness: awake, oriented and patient cooperative although sleepy   Airway and Oxygen Therapy: Patient Spontanous Breathing and Patient connected to nasal cannula oxygen  SaO2 96% on 2 liters [place]  Post-op Pain: none  Post-op Assessment: Patient's Cardiovascular Status Stable, Respiratory Function Stable, No headache, No backache, No residual numbness and No residual motor weakness  Post-op Vital Signs: stable  Complications: No apparent anesthesia complications

## 2012-02-07 NOTE — Addendum Note (Signed)
Addendum  created 02/07/12 1808 by Orlie Pollen, CRNA   Modules edited:PRL Based Order Sets

## 2012-02-07 NOTE — Progress Notes (Signed)
Ur chart review completed.  

## 2012-02-07 NOTE — Op Note (Signed)
[name] PROCEDURE DATE: 02/06/2012 - 02/07/2012  PREOPERATIVE DIAGNOSIS: Intrauterine pregnancy at  [redacted]w[redacted]d weeks gestation;  failure to progress: arrest of descent; repetitive deep variable decelerations  POSTOPERATIVE DIAGNOSIS: The same  PROCEDURE: Primary Low Transverse Cesarean Section  SURGEON:  Dr. Jaynie Collins  ANESTHESIOLOGIST: Tyrone Apple. Malen Gauze, MD  INDICATIONS: Tina Richardson is a 42 y.o. [phone] at [redacted]w[redacted]d here for cesarean section secondary to the indications listed under preoperative diagnosis; please see preoperative note for further details.  The risks of cesarean section were discussed with the patient including but were not limited to: bleeding which may require transfusion or reoperation; infection which may require antibiotics; injury to bowel, bladder, ureters or other surrounding organs; injury to the fetus; need for additional procedures including hysterectomy in the event of a life-threatening hemorrhage; placental abnormalities wth subsequent pregnancies, incisional problems, thromboembolic phenomenon and other postoperative/anesthesia complications.   The patient concurred with the proposed plan, giving informed written consent for the procedure.    FINDINGS:  Viable female infant in cephalic presentation.  Apgars 8 and 9.  Body cord x 1 and arm cord x 1.  Meconium stained amniotic fluid.  Intact placenta, three vessel cord.  Normal uterus, fallopian tubes and ovaries bilaterally. Cord blood collected for the cord blood bank.  ANESTHESIA: Epidural INTRAVENOUS FLUIDS: 700 ml ESTIMATED BLOOD LOSS: 1200 ml URINE OUTPUT:  175 ml SPECIMENS: Placenta sent to pathology COMPLICATIONS: None immediate  PROCEDURE IN DETAIL:  The patient preoperatively received intravenous antibiotics and had sequential compression devices applied to her lower extremities.  She was then taken to the operating room where the epidural anesthesia was dosed up to surgical level and was found to  be adequate. She was then placed in a dorsal supine position with a leftward tilt, and prepped and draped in a sterile manner.  A foley catheter was placed into her bladder and attached to constant gravity.  After an adequate timeout was performed, a Pfannenstiel skin incision was made with scalpel and carried through to the underlying layer of fascia. The fascia was incised in the midline, and this incision was extended bilaterally using the Mayo scissors.  Kocher clamps were applied to the superior aspect of the fascial incision and the underlying rectus muscles were dissected off bluntly. A similar process was carried out on the inferior aspect of the fascial incision. The rectus muscles were separated in the midline bluntly and the peritoneum was entered bluntly. Attention was turned to the lower uterine segment where a low transverse hysterotomy was made with a scalpel and extended bilaterally bluntly.  The infant was successfully delivered, the cord was clamped and cut and the infant was handed over to awaiting neonatology team. Uterine massage was then administered, and the placenta delivered intact with a three-vessel cord. The uterus was then cleared of clot and debris.  The hysterotomy was closed with 0 Vicryl in a running locked fashion, and an imbricating layer was also placed with 0 Vicryl. The pelvis was cleared of all clot and debris. Hemostasis was confirmed on all surfaces.  The peritoneum and the muscles were reapproximated using 0 Vicryl interrupted stitches. The fascia was then closed using 0 PDS in a running fashion.  The subcutaneous layer was irrigated, then reapproximated with 2-0 plain gut interrupted stitches, and the skin was closed with a 4-0 Vicryl subcuticular stitch. 30 ml of 0.5% Marcaine was injected subcutaneously around the incision. The patient tolerated the procedure well. Sponge, lap, instrument and needle counts were correct x 2.  She was taken to the recovery room in stable  condition.

## 2012-02-07 NOTE — Addendum Note (Signed)
Addendum  created 02/07/12 0825 by Earmon Phoenix, CRNA   Modules edited:Notes Section

## 2012-02-07 NOTE — Transfer of Care (Signed)
Immediate Anesthesia Transfer of Care Note  Patient: Tina Richardson  Procedure(s) Performed: Procedure(s) (LRB) with comments: CESAREAN SECTION (N/A) - Primary Cesarean Section Delivery Baby Boy @ 0000, Apgars 8/9   Patient Location: PACU  Anesthesia Type:Epidural  Level of Consciousness: awake  Airway & Oxygen Therapy: Patient Spontanous Breathing  Post-op Assessment: Report given to PACU RN and Post -op Vital signs reviewed and stable  Post vital signs: stable  Complications: No apparent anesthesia complications

## 2012-02-08 NOTE — Progress Notes (Signed)
I have seen and examined this patient and I agree with the above. Tina Richardson, LOGE 9:27 AM 02/08/2012

## 2012-02-08 NOTE — Progress Notes (Signed)
Post Partum Day 1 Subjective: Complaints of pain, up ad lib, tolerating po, passing flatus  Objective: Blood pressure 100/68, pulse 96, temperature 98.8 F (37.1 C), temperature source Oral, resp. rate 18, height 5' 6.5" (1.689 m), weight 253 lb (114.76 kg), last menstrual period 05/08/2011, SpO2 96.00%, unknown if currently breastfeeding.  Physical Exam:  General: alert, cooperative and no distress Lochia: appropriate Uterine Fundus: firm Incision: no significant drainage, no dehiscence, no significant erythema DVT Evaluation: No evidence of DVT seen on physical exam.   Basename 02/07/12 0510 02/06/12 1149  HGB 9.7* 12.6  HCT 29.6* 38.1    Assessment/Plan: Plan for discharge tomorrow, Breastfeeding and Contraception Mirena   LOS: 2 days   Gale Hulse 02/08/2012, 7:59 AM

## 2012-02-09 ENCOUNTER — Other Ambulatory Visit: Payer: Medicaid Other

## 2012-02-09 MED ORDER — NYSTATIN 100000 UNIT/GM EX CREA
TOPICAL_CREAM | Freq: Two times a day (BID) | CUTANEOUS | Status: DC
  Administered 2012-02-09 – 2012-02-10 (×3): via TOPICAL
  Filled 2012-02-09: qty 15

## 2012-02-09 NOTE — Progress Notes (Signed)
Post Partum Day 2 Subjective: up ad lib, voiding, tolerating PO and + flatus, no Bowel movement yet, increased edema, rash on bilateral inner thighs  Objective: Blood pressure 135/82, pulse 98, temperature 98.3 F (36.8 C), temperature source Oral, resp. rate 18, height 5' 6.5" (1.689 m), weight 253 lb (114.76 kg), last menstrual period 05/08/2011, SpO2 96.00%, unknown if currently breastfeeding.  Physical Exam:  General: alert, cooperative and no distress Lochia: appropriate Uterine Fundus: firm Incision: no significant drainage, no dehiscence, no significant erythema DVT Evaluation: No evidence of DVT seen on physical exam.   Basename 02/07/12 0510 02/06/12 1149  HGB 9.7* 12.6  HCT 29.6* 38.1    Assessment/Plan: Plan for discharge tomorrow, breast and bottle feeding, Mirena for contraception   LOS: 3 days   Onda Kattner 02/09/2012, 7:44 AM

## 2012-02-09 NOTE — Progress Notes (Signed)
I saw and examined patient, reviewed vitals and labs, and agree with above. Napoleon Form, MD

## 2012-02-10 LAB — TYPE AND SCREEN
ABO/RH(D): O POS
Antibody Screen: NEGATIVE
Unit division: 0
Unit division: 0

## 2012-02-10 MED ORDER — IBUPROFEN 600 MG PO TABS: 600.0000 mg | ORAL_TABLET | Freq: Four times a day (QID) | ORAL | Status: DC | PRN

## 2012-02-10 MED ORDER — OXYCODONE-ACETAMINOPHEN 5-325 MG PO TABS: 1.0000 | ORAL_TABLET | ORAL | Status: DC | PRN

## 2012-02-10 NOTE — Progress Notes (Signed)
Post Partum Day 3 Subjective: no complaints, up ad lib, voiding, tolerating PO and + flatus  Objective: Blood pressure 121/84, pulse 90, temperature 98.7 F (37.1 C), temperature source Oral, resp. rate 18, height 5' 6.5" (1.689 m), weight 253 lb (114.76 kg), last menstrual period 05/08/2011, SpO2 98.00%, unknown if currently breastfeeding.  Physical Exam:  General: alert, cooperative and no distress Lochia: appropriate Uterine Fundus: firm Incision: no significant drainage, no dehiscence, no significant erythema DVT Evaluation: No evidence of DVT seen on physical exam.  No results found for this basename: HGB:2,HCT:2 in the last 72 hours  Assessment/Plan: Discharge home, Breastfeeding and Contraception Mirena   LOS: 4 days   Cagney Steenson 02/10/2012, 8:06 AM

## 2012-02-10 NOTE — Progress Notes (Signed)
Seen and agree Ariona Deschene CNM 

## 2012-02-10 NOTE — Discharge Summary (Signed)
Attestation of Attending Supervision of Advanced Practitioner (CNM/NP): Evaluation and management procedures were performed by the Advanced Practitioner under my supervision and collaboration.  I have reviewed the Advanced Practitioner's note and chart, and I agree with the management and plan.  HARRAWAY-SMITH, Taven Strite 3:34 PM

## 2012-02-10 NOTE — Discharge Summary (Signed)
Obstetric Discharge Summary Reason for Admission: onset of laborKimberly Richardson is a 42 y.o. female presenting for induction of labor due to variable decelerations at her clinic this AM.  Prenatal Procedures: ultrasound Intrapartum Procedures: cesarean: low cervical, transverse Postpartum Procedures: none Complications-Operative and Postpartum: none Hemoglobin  Date Value Range Status  02/07/2012 9.7* 12.0 - 15.0 g/dL Final     REPEATED TO VERIFY     DELTA CHECK NOTED     HCT  Date Value Range Status  02/07/2012 29.6* 36.0 - 46.0 % Final    Physical Exam:  General: alert, cooperative and no distress Lochia: appropriate Uterine Fundus: firm Incision: no significant drainage, no dehiscence, no significant erythema DVT Evaluation: No evidence of DVT seen on physical exam.  Discharge Diagnoses: S/P cesarean section for failure to progress in labor  Discharge Information: Date: 02/10/2012 Activity: pelvic rest Diet: routine Medications: PNV, Ibuprofen, Colace and Percocet Condition: stable Instructions: refer to practice specific booklet Discharge to: home   Newborn Data: Live born female  Birth Weight: 8 lb 6.6 oz (3815 g) APGAR: 8, 9  Home with mother.  Henningsgaard, Elige Radon 02/10/2012, 8:14 AM  Patient seen by me also Agree with note  Wynelle Bourgeois CNM

## 2012-02-11 NOTE — Progress Notes (Signed)
NST reactive on 02/02/12 

## 2012-02-12 ENCOUNTER — Inpatient Hospital Stay (HOSPITAL_COMMUNITY): Admission: RE | Admit: 2012-02-12 | Payer: Medicaid Other | Source: Ambulatory Visit

## 2012-03-10 ENCOUNTER — Other Ambulatory Visit: Payer: Self-pay

## 2012-03-12 ENCOUNTER — Encounter (HOSPITAL_COMMUNITY): Payer: Self-pay

## 2012-03-12 ENCOUNTER — Ambulatory Visit (INDEPENDENT_AMBULATORY_CARE_PROVIDER_SITE_OTHER): Payer: Medicaid Other | Admitting: Obstetrics and Gynecology

## 2012-03-12 ENCOUNTER — Encounter: Payer: Self-pay | Admitting: Obstetrics and Gynecology

## 2012-03-12 ENCOUNTER — Inpatient Hospital Stay (HOSPITAL_COMMUNITY)
Admission: AD | Admit: 2012-03-12 | Discharge: 2012-03-12 | Disposition: A | Discharge status: Home / Self Care | Payer: Medicaid Other | Source: Ambulatory Visit | Attending: Obstetrics & Gynecology | Admitting: Obstetrics & Gynecology

## 2012-03-12 ENCOUNTER — Other Ambulatory Visit: Payer: Self-pay | Admitting: Obstetrics and Gynecology

## 2012-03-12 ENCOUNTER — Other Ambulatory Visit (HOSPITAL_COMMUNITY)
Admission: RE | Admit: 2012-03-12 | Discharge: 2012-03-12 | Disposition: A | Discharge status: Home / Self Care | Payer: Medicaid Other | Source: Ambulatory Visit | Attending: Obstetrics and Gynecology | Admitting: Obstetrics and Gynecology

## 2012-03-12 DIAGNOSIS — O2390 Unspecified genitourinary tract infection in pregnancy, unspecified trimester: Secondary | ICD-10-CM | POA: Insufficient documentation

## 2012-03-12 DIAGNOSIS — N760 Acute vaginitis: Secondary | ICD-10-CM | POA: Insufficient documentation

## 2012-03-12 DIAGNOSIS — Z3049 Encounter for surveillance of other contraceptives: Secondary | ICD-10-CM

## 2012-03-12 DIAGNOSIS — Z1239 Encounter for other screening for malignant neoplasm of breast: Secondary | ICD-10-CM

## 2012-03-12 DIAGNOSIS — O165 Unspecified maternal hypertension, complicating the puerperium: Secondary | ICD-10-CM

## 2012-03-12 DIAGNOSIS — IMO0001 Reserved for inherently not codable concepts without codable children: Secondary | ICD-10-CM | POA: Insufficient documentation

## 2012-03-12 LAB — URINALYSIS, ROUTINE W REFLEX MICROSCOPIC
Nitrite: NEGATIVE
Specific Gravity, Urine: 1.015 (ref 1.005–1.030)
Urobilinogen, UA: 0.2 mg/dL (ref 0.0–1.0)
pH: 5.5 (ref 5.0–8.0)

## 2012-03-12 LAB — COMPREHENSIVE METABOLIC PANEL
Albumin: 3.5 g/dL (ref 3.5–5.2)
Alkaline Phosphatase: 104 U/L (ref 39–117)
BUN: 16 mg/dL (ref 6–23)
Creatinine, Ser: 1.16 mg/dL — ABNORMAL HIGH (ref 0.50–1.10)
GFR calc Af Amer: 66 mL/min — ABNORMAL LOW (ref >90)
Glucose, Bld: 97 mg/dL (ref 70–99)
Potassium: 3.8 mEq/L (ref 3.5–5.1)
Total Protein: 6.8 g/dL (ref 6.0–8.3)

## 2012-03-12 LAB — CBC
HCT: 36.4 % (ref 36.0–46.0)
MCV: 85.8 fL (ref 78.0–100.0)
Platelets: 273 10*3/uL (ref 150–400)
RBC: 4.24 MIL/uL (ref 3.87–5.11)
RDW: 14.2 % (ref 11.5–15.5)
WBC: 6.5 10*3/uL (ref 4.0–10.5)

## 2012-03-12 LAB — PROTEIN / CREATININE RATIO, URINE
Creatinine, Urine: 62.94 mg/dL
Protein Creatinine Ratio: 0.1 (ref 0.00–0.15)
Total Protein, Urine: 6.3 mg/dL

## 2012-03-12 MED ORDER — KETOROLAC TROMETHAMINE 60 MG/2ML IM SOLN
60.0000 mg | Freq: Once | INTRAMUSCULAR | Status: AC
  Administered 2012-03-12: 60 mg via INTRAMUSCULAR
  Filled 2012-03-12: qty 2

## 2012-03-12 MED ORDER — IBUPROFEN 600 MG PO TABS: 600.0000 mg | ORAL_TABLET | Freq: Four times a day (QID) | ORAL | Status: DC | PRN

## 2012-03-12 MED ORDER — LISINOPRIL-HYDROCHLOROTHIAZIDE 10-12.5 MG PO TABS: 1.0000 | ORAL_TABLET | Freq: Every day | ORAL | Status: DC

## 2012-03-12 MED ORDER — HYDRALAZINE HCL 10 MG PO TABS
10.0000 mg | ORAL_TABLET | Freq: Once | ORAL | Status: AC
  Administered 2012-03-12: 10 mg via ORAL
  Filled 2012-03-12 (×2): qty 1

## 2012-03-12 MED ORDER — ACETAMINOPHEN 500 MG PO TABS
1000.0000 mg | ORAL_TABLET | Freq: Once | ORAL | Status: AC
  Administered 2012-03-12: 1000 mg via ORAL
  Filled 2012-03-12: qty 2

## 2012-03-12 MED ORDER — MEDROXYPROGESTERONE ACETATE 150 MG/ML IM SUSP
150.0000 mg | INTRAMUSCULAR | Status: DC
  Administered 2012-03-12: 150 mg via INTRAMUSCULAR

## 2012-03-12 NOTE — MAU Note (Signed)
Patient had a c-section on 02/07/2012. She was sent up from the clinic due to elevated BP. She c/o headache and blurred visual. She denies epigastric pain.

## 2012-03-12 NOTE — MAU Note (Signed)
Delivered c/s on 01/14. Was at the clinic downstairs and they sent her up.  Has a headache, denies problems with BP during preg.  Denies visual changes, HA or epigastric pain.

## 2012-03-12 NOTE — MAU Provider Note (Signed)
History     CSN: [id number]  Arrival date and time: 03/12/12 1356   None     Chief Complaint  Patient presents with  . Hypertension   HPI 42 y.o. [id number] s/p c/s on 02/07/12 sent from clinic after postpartum visit with elevated blood pressures. Patient has had headache off and on since discharge, increasing in frequency and intensity. No vision changes, vision is blurry at times but this is not new. No RUQ pain. No high BP during pregnancy, but pt thinks she had a few high pressures during labor. BP on discharge from hospital were 130s/80s highest. Did have gestational diabetes.    OB History   Grav Para Term Preterm Abortions TAB SAB Ect Mult Living   8 8 8       8       Past Medical History  Diagnosis Date  . Hypertension   . Anxiety   . Multiparity   . High-risk pregnancy   . Gestational diabetes     diet controlled per patient    Past Surgical History  Procedure Laterality Date  . No past surgeries    . Cesarean section  02/06/2012    Procedure: CESAREAN SECTION;  Surgeon: Tereso Newcomer, MD;  Location: [facility];  Service: Obstetrics;  Laterality: N/A;  Primary Cesarean Section Delivery Baby Boy @ 0000, Apgars 8/9     Family History  Problem Relation Age of Onset  . Diabetes Mother   . Hypertension Father   . Diabetes Father   . COPD Father   . Asthma Father   . Other Neg Hx     History  Substance Use Topics  . Smoking status: Never Smoker   . Smokeless tobacco: Never Used  . Alcohol Use: No     Comment: none since conception    Allergies:  Allergies  Allergen Reactions  . Dilaudid (Hydromorphone Hcl) Other (See Comments)    Could not stop shaking    Prescriptions prior to admission  Medication Sig Dispense Refill  . ibuprofen (ADVIL,MOTRIN) 600 MG tablet Take 600 mg by mouth every 6 (six) hours as needed for pain.      . [DISCONTINUED] ibuprofen (ADVIL,MOTRIN) 600 MG tablet Take 1 tablet (600 mg total) by mouth every 6 (six) hours as needed.   30 tablet  1    Review of Systems  Constitutional: Negative for fever and chills.  HENT: Negative for congestion and sore throat.   Eyes: Positive for blurred vision. Negative for double vision and photophobia.  Respiratory: Negative for cough, shortness of breath and wheezing.   Cardiovascular: Negative for chest pain and palpitations.  Gastrointestinal: Negative for nausea, vomiting, abdominal pain and diarrhea.  Genitourinary: Negative for dysuria.  Neurological: Positive for dizziness (lightheadedness), weakness (generalized malaise/weakness/fatigue) and headaches.   Physical Exam   Blood pressure 151/116, pulse 65, temperature 99 F (37.2 C), temperature source Oral, resp. rate 18, not currently breastfeeding.  Physical Exam  Constitutional: She is oriented to person, place, and time.  Obese, no distress  HENT:  Head: Normocephalic and atraumatic.  Eyes: Conjunctivae and EOM are normal.  Neck: Normal range of motion. Neck supple.  Cardiovascular: Normal rate, regular rhythm and normal heart sounds.   Respiratory: Effort normal and breath sounds normal. No respiratory distress.  GI: Soft. Bowel sounds are normal. There is no tenderness. There is no rebound and no guarding.  Musculoskeletal: Normal range of motion. She exhibits no edema and no tenderness.  Neurological: She is alert and  oriented to person, place, and time. She has normal reflexes.  Skin: Skin is warm and dry.  Psychiatric: She has a normal mood and affect.   Filed Vitals:   03/12/12 1546 03/12/12 1601 03/12/12 1750 03/12/12 1828  BP: 177/99 151/116 182/100 155/104  Pulse: 63 65 73 69  Temp:      TempSrc:   Oral   Resp:   18 20   Results for orders placed during the hospital encounter of 03/12/12 (from the past 24 hour(s))  COMPREHENSIVE METABOLIC PANEL     Status: Abnormal   Collection Time    03/12/12  2:25 PM      Result Value Range   Sodium 138  135 - 145 mEq/L   Potassium 3.8  3.5 - 5.1 mEq/L    Chloride 104  96 - 112 mEq/L   CO2 23  19 - 32 mEq/L   Glucose, Bld 97  70 - 99 mg/dL   BUN 16  6 - 23 mg/dL   Creatinine, Ser 4.09 (*) 0.50 - 1.10 mg/dL   Calcium 8.6  8.4 - 81.1 mg/dL   Total Protein 6.8  6.0 - 8.3 g/dL   Albumin 3.5  3.5 - 5.2 g/dL   AST 17  0 - 37 U/L   ALT 17  0 - 35 U/L   Alkaline Phosphatase 104  39 - 117 U/L   Total Bilirubin 0.4  0.3 - 1.2 mg/dL   GFR calc non Af Amer 57 (*) >90 mL/min   GFR calc Af Amer 66 (*) >90 mL/min  CBC     Status: Abnormal   Collection Time    03/12/12  2:25 PM      Result Value Range   WBC 6.5  4.0 - 10.5 K/uL   RBC 4.24  3.87 - 5.11 MIL/uL   Hemoglobin 11.6 (*) 12.0 - 15.0 g/dL   HCT 91.4  78.2 - 95.6 %   MCV 85.8  78.0 - 100.0 fL   MCH 27.4  26.0 - 34.0 pg   MCHC 31.9  30.0 - 36.0 g/dL   RDW 21.3  08.6 - 57.8 %   Platelets 273  150 - 400 K/uL  URINALYSIS, ROUTINE W REFLEX MICROSCOPIC     Status: Abnormal   Collection Time    03/12/12  3:05 PM      Result Value Range   Color, Urine YELLOW  YELLOW   APPearance CLEAR  CLEAR   Specific Gravity, Urine 1.015  1.005 - 1.030   pH 5.5  5.0 - 8.0   Glucose, UA NEGATIVE  NEGATIVE mg/dL   Hgb urine dipstick TRACE (*) NEGATIVE   Bilirubin Urine NEGATIVE  NEGATIVE   Ketones, ur NEGATIVE  NEGATIVE mg/dL   Protein, ur NEGATIVE  NEGATIVE mg/dL   Urobilinogen, UA 0.2  0.0 - 1.0 mg/dL   Nitrite NEGATIVE  NEGATIVE   Leukocytes, UA NEGATIVE  NEGATIVE  PROTEIN / CREATININE RATIO, URINE     Status: None   Collection Time    03/12/12  3:05 PM      Result Value Range   Creatinine, Urine 62.94     Total Protein, Urine 6.3     PROTEIN CREATININE RATIO 0.10  0.00 - 0.15  URINE MICROSCOPIC-ADD ON     Status: Abnormal   Collection Time    03/12/12  3:05 PM      Result Value Range   Squamous Epithelial / LPF FEW (*) RARE   WBC, UA 0-2  <  3 WBC/hpf   RBC / HPF 3-6  <3 RBC/hpf     MAU Course  Procedures  Given 10 mg hydralazine PO in MAU with no change in BP.  Assessment and Plan   42 y.o. [id number] at 1 month PP with HTN, likely chronic -  No proteinuria, LFTs and platelets normal.  -  Lisinopril-HCTZ rx given (10-12.5 mg) - Pt to follow up in clinic in 1-2 weeks for BP check -  Pt encouraged to establish care with PCP for health maintenance - Discharge home  Napoleon Form 03/12/2012, 4:58 PM

## 2012-03-12 NOTE — Progress Notes (Signed)
  Subjective:     Tina Richardson is a 42 y.o. female who presents for a postpartum visit. She is 4 weeks postpartum following a low cervical transverse Cesarean section. I have fully reviewed the prenatal and intrapartum course. The delivery was at 39 gestational weeks. Outcome: primary cesarean section, low transverse incision and secondary to failure to progress and nonreassuring fetal heart tracing. Anesthesia: epidural. Postpartum course has been uncomplicated. Baby's course has been uncomplicated. Baby is feeding by breast initially and then switched to bottle (formula). Bleeding no bleeding. Bowel function is normal. Bladder function is normal. Patient is not sexually active. Contraception method is none. Postpartum depression screening: negative. Patient reports random headaches since discharged from the hospital not relieved by tylenol or ibuprofen. Patient denies any visual disturbances, RUQ/epigastric pain, nausea or emesis.     Review of Systems A comprehensive review of systems was negative.   Objective:    BP 172/107  Pulse 72  Ht 5' 6.5" (1.689 m)  Wt 226 lb 6.4 oz (102.694 kg)  BMI 36 kg/m2  Breastfeeding? No  General:  alert, cooperative and no distress   Breasts:  inspection negative, no nipple discharge or bleeding, no masses or nodularity palpable  Lungs: clear to auscultation bilaterally  Heart:  regular rate and rhythm  Abdomen: soft, non-tender; bowel sounds normal; no masses,  no organomegaly and incision: no erythema, induration or drainage. Healed very well   Vulva:  normal  Vagina: vagina positive for yellowish dicharge  Cervix:  multiparous appearance  Corpus: normal size, contour, position, consistency, mobility, non-tender  Adnexa:  no mass, fullness, tenderness  Rectal Exam: Not performed.        Assessment:     Normal postpartum exam. Pap smear not done at today's visit.   Plan:    1. Contraception: Depo-Provera injections today. Patient advised to  use back up birth control for the next 3 weeks. 2. Pap smear due in August 2014 3. Follow up in: August  for annual exam or as needed.  4. Referral for screening mammogram provided 5. Patient sent to MAU for evaluation of elevated BP in the presence of persistent headaches. 6. Wet prep collected

## 2012-03-13 ENCOUNTER — Telehealth: Payer: Self-pay | Admitting: *Deleted

## 2012-03-13 NOTE — Telephone Encounter (Signed)
Message copied by Jill Side on Tue Mar 13, 2012  9:52 AM ------      Message from: FERRY, [place]      Created: Mon Mar 12, 2012  5:58 PM      Regarding: blood pressure check       Pt seen for PP visit 03/12/12 with HTN. Sent to [facility]. Started on BP medication. Needs to get BP check in clinic in 1-2 weeks. Please call with appt. ------

## 2012-03-13 NOTE — Telephone Encounter (Signed)
Called pt to discuss need for follow up clinic visit to check her BP. Pt stated that she has a very bad H/A- she has not obtained her Rx for BP med as of yet. She states that she is going to pick it up this morning. I advised pt to get it ASAP and to start it right away. Pt took Ibuprofen 600 mg this am with no relief from H/A. I advised her to also take tylenol 2 tabs every 6hrs as well and to alternate with ibuprofen. She should have improvement of sx within 24 hrs once she begins her new BP med. She should return to [facility]U if her sx have not improved within 48 hrs or if her sx become worse. She will need to return to clinic on 03/23/12 @ 0900 for BP check. Pt voiced understanding of all information and instructions given.

## 2012-03-14 NOTE — [facility] Provider Note (Signed)
Attestation of Attending Supervision of Advanced Practitioner (CNM/NP): Evaluation and management procedures were performed by the Advanced Practitioner under my supervision and collaboration.  I have reviewed the Advanced Practitioner's note and chart, and I agree with the management and plan.  HARRAWAY-SMITH, Samani Deal 9:40 AM     

## 2012-03-15 ENCOUNTER — Telehealth: Payer: Self-pay | Admitting: General Practice

## 2012-03-15 DIAGNOSIS — B9689 Other specified bacterial agents as the cause of diseases classified elsewhere: Secondary | ICD-10-CM

## 2012-03-15 MED ORDER — METRONIDAZOLE 500 MG PO TABS: 500.0000 mg | ORAL_TABLET | Freq: Two times a day (BID) | ORAL | Status: DC

## 2012-03-15 NOTE — Telephone Encounter (Signed)
Message copied by Kathee Delton on Thu Mar 15, 2012 11:51 AM ------      Message from: CONSTANT, PEGGY      Created: Thu Mar 15, 2012 11:49 AM       Please inform patient of positive BV and flagyl e-prescribed            Peggy ------

## 2012-03-15 NOTE — Addendum Note (Signed)
Addended by: Catalina Antigua on: 03/15/2012 11:49 AM   Modules accepted: Orders

## 2012-03-15 NOTE — Telephone Encounter (Signed)
Called patient, no answer- left message to call us back at the clinics 

## 2012-03-16 MED ORDER — METRONIDAZOLE 0.75 % VA GEL: 1.0000 | Freq: Every day | VAGINAL | Status: AC

## 2012-03-16 NOTE — Telephone Encounter (Signed)
Called patient and informed her of BV and that flagyl had been called in to her [facility] pharmacy on [address] in [place]. Patient immediately became upset and stated she did not want flagyl because she just took that and it made her feel worse. Told patient that I will switch the medication for something else that will also treat BV and send it to that same pharmacy. Patient was satisfied, verbalized understanding & had no further questions

## 2012-03-27 ENCOUNTER — Encounter: Payer: Self-pay | Admitting: Obstetrics & Gynecology

## 2012-09-11 ENCOUNTER — Ambulatory Visit (HOSPITAL_COMMUNITY): Payer: Medicaid Other | Attending: Obstetrics and Gynecology

## 2012-11-20 ENCOUNTER — Encounter (HOSPITAL_BASED_OUTPATIENT_CLINIC_OR_DEPARTMENT_OTHER): Payer: Self-pay | Admitting: Emergency Medicine

## 2012-11-20 ENCOUNTER — Emergency Department (HOSPITAL_BASED_OUTPATIENT_CLINIC_OR_DEPARTMENT_OTHER)
Admission: EM | Admit: 2012-11-20 | Discharge: 2012-11-21 | Disposition: A | Discharge status: Home / Self Care | Payer: Medicaid Other | Attending: Emergency Medicine | Admitting: Emergency Medicine

## 2012-11-20 ENCOUNTER — Emergency Department (HOSPITAL_BASED_OUTPATIENT_CLINIC_OR_DEPARTMENT_OTHER): Payer: Medicaid Other

## 2012-11-20 DIAGNOSIS — Z641 Problems related to multiparity: Secondary | ICD-10-CM | POA: Insufficient documentation

## 2012-11-20 DIAGNOSIS — R5381 Other malaise: Secondary | ICD-10-CM | POA: Insufficient documentation

## 2012-11-20 DIAGNOSIS — R209 Unspecified disturbances of skin sensation: Secondary | ICD-10-CM | POA: Insufficient documentation

## 2012-11-20 DIAGNOSIS — Z792 Long term (current) use of antibiotics: Secondary | ICD-10-CM | POA: Insufficient documentation

## 2012-11-20 DIAGNOSIS — Z8632 Personal history of gestational diabetes: Secondary | ICD-10-CM | POA: Insufficient documentation

## 2012-11-20 DIAGNOSIS — F411 Generalized anxiety disorder: Secondary | ICD-10-CM | POA: Insufficient documentation

## 2012-11-20 DIAGNOSIS — R51 Headache: Secondary | ICD-10-CM | POA: Insufficient documentation

## 2012-11-20 DIAGNOSIS — Z79899 Other long term (current) drug therapy: Secondary | ICD-10-CM | POA: Insufficient documentation

## 2012-11-20 DIAGNOSIS — I10 Essential (primary) hypertension: Secondary | ICD-10-CM | POA: Insufficient documentation

## 2012-11-20 LAB — URINALYSIS, ROUTINE W REFLEX MICROSCOPIC
Hgb urine dipstick: NEGATIVE
Protein, ur: NEGATIVE mg/dL
Urobilinogen, UA: 0.2 mg/dL (ref 0.0–1.0)

## 2012-11-20 LAB — COMPREHENSIVE METABOLIC PANEL
Alkaline Phosphatase: 88 U/L (ref 39–117)
BUN: 20 mg/dL (ref 6–23)
Calcium: 9.6 mg/dL (ref 8.4–10.5)
Creatinine, Ser: 1.2 mg/dL — ABNORMAL HIGH (ref 0.50–1.10)
GFR calc Af Amer: 63 mL/min — ABNORMAL LOW (ref >90)
Glucose, Bld: 92 mg/dL (ref 70–99)
Total Protein: 7.5 g/dL (ref 6.0–8.3)

## 2012-11-20 LAB — TROPONIN I
Troponin I: <0.3 ng/mL (ref <0.30)
Troponin I: <0.3 ng/mL (ref <0.30)

## 2012-11-20 LAB — CBC WITH DIFFERENTIAL/PLATELET
Eosinophils Absolute: 0.2 10*3/uL (ref 0.0–0.7)
Eosinophils Relative: 2 % (ref 0–5)
Hemoglobin: 12.4 g/dL (ref 12.0–15.0)
Lymphs Abs: 3.8 10*3/uL (ref 0.7–4.0)
MCH: 26.4 pg (ref 26.0–34.0)
MCHC: 32.7 g/dL (ref 30.0–36.0)
MCV: 80.8 fL (ref 78.0–100.0)
Monocytes Absolute: 0.6 10*3/uL (ref 0.1–1.0)
Monocytes Relative: 5 % (ref 3–12)
RBC: 4.69 MIL/uL (ref 3.87–5.11)

## 2012-11-20 MED ORDER — HYDROCHLOROTHIAZIDE 25 MG PO TABS: 25.0000 mg | ORAL_TABLET | Freq: Once | ORAL | Status: DC

## 2012-11-20 MED ORDER — HYDROCODONE-ACETAMINOPHEN 5-325 MG PO TABS
2.0000 | ORAL_TABLET | Freq: Once | ORAL | Status: AC
  Administered 2012-11-20: 2 via ORAL
  Filled 2012-11-20: qty 2

## 2012-11-20 MED ORDER — LISINOPRIL 10 MG PO TABS
10.0000 mg | ORAL_TABLET | Freq: Once | ORAL | Status: AC
  Administered 2012-11-20: 10 mg via ORAL
  Filled 2012-11-20: qty 1

## 2012-11-20 MED ORDER — LISINOPRIL 20 MG PO TABS: 20.0000 mg | ORAL_TABLET | Freq: Once | ORAL | Status: DC

## 2012-11-20 MED ORDER — HYDROCHLOROTHIAZIDE 25 MG PO TABS
50.0000 mg | ORAL_TABLET | Freq: Once | ORAL | Status: AC
  Administered 2012-11-20: 50 mg via ORAL
  Filled 2012-11-20: qty 2

## 2012-11-20 NOTE — ED Provider Notes (Signed)
CSN: [id number]     Arrival date & time 11/20/12  1908 History   First MD Initiated Contact with Patient 11/20/12 1956     Chief Complaint  Patient presents with  . Hypertension  . Headache   (Consider location/radiation/quality/duration/timing/severity/associated sxs/prior Treatment) Patient is a 42 y.o. female presenting with headaches. The history is provided by the patient. No language interpreter was used.  Headache Pain location:  Generalized Quality:  Sharp Radiates to:  Does not radiate Severity currently:  7/10 Severity at highest:  7/10 Onset quality:  Gradual Duration:  2 days Timing:  Constant Progression:  Worsening Chronicity:  New Similar to prior headaches: yes   Relieved by:  Nothing Ineffective treatments:  None tried Associated symptoms: fatigue and numbness   Risk factors: no anger   Pt reports she has numbness and aching in her left hand and left arm.   Pt reports able to move without difficulty.   Pt reports she has been out of her blood pressure medications for several weeks.    Past Medical History  Diagnosis Date  . Hypertension   . Anxiety   . Multiparity   . High-risk pregnancy   . Gestational diabetes     diet controlled per patient   Past Surgical History  Procedure Laterality Date  . No past surgeries    . Cesarean section  02/06/2012    Procedure: CESAREAN SECTION;  Surgeon: Tereso Newcomer, MD;  Location: [facility];  Service: Obstetrics;  Laterality: N/A;  Primary Cesarean Section Delivery Baby Boy @ 0000, Apgars 8/9    Family History  Problem Relation Age of Onset  . Diabetes Mother   . Hypertension Father   . Diabetes Father   . COPD Father   . Asthma Father   . Other Neg Hx    History  Substance Use Topics  . Smoking status: Never Smoker   . Smokeless tobacco: Never Used  . Alcohol Use: No     Comment: none since conception   OB History   Grav Para Term Preterm Abortions TAB SAB Ect Mult Living   8 8 8       8       Review of Systems  Constitutional: Positive for fatigue.  Neurological: Positive for numbness and headaches.  All other systems reviewed and are negative.    Allergies  Dilaudid  Home Medications   Current Outpatient Rx  Name  Route  Sig  Dispense  Refill  . ibuprofen (ADVIL,MOTRIN) 600 MG tablet   Oral   Take 600 mg by mouth every 6 (six) hours as needed for pain.         Marland Kitchen ibuprofen (ADVIL,MOTRIN) 600 MG tablet   Oral   Take 1 tablet (600 mg total) by mouth every 6 (six) hours as needed.   30 tablet   1   . lisinopril-hydrochlorothiazide (ZESTORETIC) 10-12.5 MG per tablet   Oral   Take 1 tablet by mouth daily.   30 tablet   3   . metroNIDAZOLE (FLAGYL) 500 MG tablet   Oral   Take 1 tablet (500 mg total) by mouth 2 (two) times daily.   14 tablet   0    BP 218/115  Pulse 80  Temp(Src) 99.4 F (37.4 C) (Oral)  Resp 16  Ht 5\' 6"  (1.676 m)  Wt 200 lb (90.719 kg)  BMI 32.3 kg/m2  SpO2 100%  LMP 11/02/2012  Breastfeeding? No Physical Exam  Nursing note and vitals reviewed. Constitutional:  She is oriented to person, place, and time. She appears well-developed and well-nourished.  HENT:  Head: Normocephalic.  Right Ear: External ear normal.  Left Ear: External ear normal.  Eyes: Conjunctivae and EOM are normal. Pupils are equal, round, and reactive to light.  Neck: Normal range of motion. Neck supple.  Cardiovascular: Normal rate and normal heart sounds.   Pulmonary/Chest: Effort normal.  Abdominal: Soft. Bowel sounds are normal. She exhibits no distension.  Musculoskeletal: Normal range of motion.  Neurological: She is alert and oriented to person, place, and time. She has normal reflexes.  Skin: Skin is warm.  Psychiatric: She has a normal mood and affect.    ED Course  Procedures (including critical care time) Labs Review Labs Reviewed  CBC WITH DIFFERENTIAL - Abnormal; Notable for the following:    WBC 10.6 (*)    All other components within  normal limits  COMPREHENSIVE METABOLIC PANEL  TROPONIN I   Imaging Review No results found.  EKG Interpretation     Ventricular Rate:  78 PR Interval:  160 QRS Duration: 72 QT Interval:  386 QTC Calculation: 440 R Axis:   43 Text Interpretation:  Normal sinus rhythm with sinus arrhythmia Minimal voltage criteria for LVH, may be normal variant Borderline ECG No old tracing to compare            MDM   1. Hypertension   2. Headache    Ct scan normal.   I will repeat troponin at 11;15.   Pt given rx's for blood pressure medication    Elson Areas, [place] 11/23/12 1413

## 2012-11-20 NOTE — Discharge Instructions (Signed)
Arterial Hypertension °Arterial hypertension (high blood pressure) is a condition of elevated pressure in your blood vessels. Hypertension over a long period of time is a risk factor for strokes, heart attacks, and heart failure. It is also the leading cause of kidney (renal) failure.  °CAUSES  °· In Adults -- Over 90% of all hypertension has no known cause. This is called essential or primary hypertension. In the other 10% of people with hypertension, the increase in blood pressure is caused by another disorder. This is called secondary hypertension. Important causes of secondary hypertension are: °· Heavy alcohol use. °· Obstructive sleep apnea. °· Hyperaldosterosim (Conn's syndrome). °· Steroid use. °· Chronic kidney failure. °· Hyperparathyroidism. °· Medications. °· Renal artery stenosis. °· Pheochromocytoma. °· Cushing's disease. °· Coarctation of the aorta. °· Scleroderma renal crisis. °· Licorice (in excessive amounts). °· Drugs (cocaine, methamphetamine). °Your caregiver can explain any items above that apply to you. °· In Children -- Secondary hypertension is more common and should always be considered. °· Pregnancy -- Few women of childbearing age have high blood pressure. However, up to 10% of them develop hypertension of pregnancy. Generally, this will not harm the woman. It may be a sign of 3 complications of pregnancy: preeclampsia, HELLP syndrome, and eclampsia. Follow up and control with medication is necessary. °SYMPTOMS  °· This condition normally does not produce any noticeable symptoms. It is usually found during a routine exam. °· Malignant hypertension is a late problem of high blood pressure. It may have the following symptoms: °· Headaches. °· Blurred vision. °· End-organ damage (this means your kidneys, heart, lungs, and other organs are being damaged). °· Stressful situations can increase the blood pressure. If a person with normal blood pressure has their blood pressure go up while being  seen by their caregiver, this is often termed "white coat hypertension." Its importance is not known. It may be related with eventually developing hypertension or complications of hypertension. °· Hypertension is often confused with mental tension, stress, and anxiety. °DIAGNOSIS  °The diagnosis is made by 3 separate blood pressure measurements. They are taken at least 1 week apart from each other. If there is organ damage from hypertension, the diagnosis may be made without repeat measurements. °Hypertension is usually identified by having blood pressure readings: °· Above 140/90 mmHg measured in both arms, at 3 separate times, over a couple weeks. °· Over 130/80 mmHg should be considered a risk factor and may require treatment in patients with diabetes. °Blood pressure readings over 120/80 mmHg are called "pre-hypertension" even in non-diabetic patients. °To get a true blood pressure measurement, use the following guidelines. Be aware of the factors that can alter blood pressure readings. °· Take measurements at least 1 hour after caffeine. °· Take measurements 30 minutes after smoking and without any stress. This is another reason to quit smoking  it raises your blood pressure. °· Use a proper cuff size. Ask your caregiver if you are not sure about your cuff size. °· Most home blood pressure cuffs are automatic. They will measure systolic and diastolic pressures. The systolic pressure is the pressure reading at the start of sounds. Diastolic pressure is the pressure at which the sounds disappear. If you are elderly, measure pressures in multiple postures. Try sitting, lying or standing. °· Sit at rest for a minimum of 5 minutes before taking measurements. °· You should not be on any medications like decongestants. These are found in many cold medications. °· Record your blood pressure readings and review   them with your caregiver. °If you have hypertension: °· Your caregiver may do tests to be sure you do not have  secondary hypertension (see "causes" above). °· Your caregiver may also look for signs of metabolic syndrome. This is also called Syndrome X or Insulin Resistance Syndrome. You may have this syndrome if you have type 2 diabetes, abdominal obesity, and abnormal blood lipids in addition to hypertension. °· Your caregiver will take your medical and family history and perform a physical exam. °· Diagnostic tests may include blood tests (for glucose, cholesterol, potassium, and kidney function), a urinalysis, or an EKG. Other tests may also be necessary depending on your condition. °PREVENTION  °There are important lifestyle issues that you can adopt to reduce your chance of developing hypertension: °· Maintain a normal weight. °· Limit the amount of salt (sodium) in your diet. °· Exercise often. °· Limit alcohol intake. °· Get enough potassium in your diet. Discuss specific advice with your caregiver. °· Follow a DASH diet (dietary approaches to stop hypertension). This diet is rich in fruits, vegetables, and low-fat dairy products, and avoids certain fats. °PROGNOSIS  °Essential hypertension cannot be cured. Lifestyle changes and medical treatment can lower blood pressure and reduce complications. The prognosis of secondary hypertension depends on the underlying cause. Many people whose hypertension is controlled with medicine or lifestyle changes can live a normal, healthy life.  °RISKS AND COMPLICATIONS  °While high blood pressure alone is not an illness, it often requires treatment due to its short- and long-term effects on many organs. Hypertension increases your risk for: °· CVAs or strokes (cerebrovascular accident). °· Heart failure due to chronically high blood pressure (hypertensive cardiomyopathy). °· Heart attack (myocardial infarction). °· Damage to the retina (hypertensive retinopathy). °· Kidney failure (hypertensive nephropathy). °Your caregiver can explain list items above that apply to you. Treatment  of hypertension can significantly reduce the risk of complications. °TREATMENT  °· For overweight patients, weight loss and regular exercise are recommended. Physical fitness lowers blood pressure. °· Mild hypertension is usually treated with diet and exercise. A diet rich in fruits and vegetables, fat-free dairy products, and foods low in fat and salt (sodium) can help lower blood pressure. Decreasing salt intake decreases blood pressure in a 1/3 of people. °· Stop smoking if you are a smoker. °The steps above are highly effective in reducing blood pressure. While these actions are easy to suggest, they are difficult to achieve. Most patients with moderate or severe hypertension end up requiring medications to bring their blood pressure down to a normal level. There are several classes of medications for treatment. Blood pressure pills (antihypertensives) will lower blood pressure by their different actions. Lowering the blood pressure by 10 mmHg may decrease the risk of complications by as much as 25%. °The goal of treatment is effective blood pressure control. This will reduce your risk for complications. Your caregiver will help you determine the best treatment for you according to your lifestyle. What is excellent treatment for one person, may not be for you. °HOME CARE INSTRUCTIONS  °· Do not smoke. °· Follow the lifestyle changes outlined in the "Prevention" section. °· If you are on medications, follow the directions carefully. Blood pressure medications must be taken as prescribed. Skipping doses reduces their benefit. It also puts you at risk for problems. °· Follow up with your caregiver, as directed. °· If you are asked to monitor your blood pressure at home, follow the guidelines in the "Diagnosis" section above. °SEEK MEDICAL CARE   IF:   You think you are having medication side effects.  You have recurrent headaches or lightheadedness.  You have swelling in your ankles.  You have trouble with  your vision. SEEK IMMEDIATE MEDICAL CARE IF:   You have sudden onset of chest pain or pressure, difficulty breathing, or other symptoms of a heart attack.  You have a severe headache.  You have symptoms of a stroke (such as sudden weakness, difficulty speaking, difficulty walking). MAKE SURE YOU:   Understand these instructions.  Will watch your condition.  Will get help right away if you are not doing well or get worse. Document Released: 01/10/2005 Document Revised: 04/04/2011 Document Reviewed: 08/10/2006 [facility] Patient Information 2014 [place], [place]. Headache and Allergies The relationship between allergies and headaches is unclear. Many people with allergic or infectious nasal problems also have headaches (migraines or sinus headaches). However, sometimes allergies can cause pressure that feels like a headache, and sometimes headaches can cause allergy-like symptoms. It is not always clear whether your symptoms are caused by allergies or by a headache. CAUSES   Migraine: The cause of a migraine is not always known.  Sinus Headache: The cause of a sinus headache may be a sinus infection. Other conditions that may be related to sinus headaches include:  Hay fever (allergic rhinitis).  Deviation of the nasal septum.  Swelling or clogging of the nasal passages. SYMPTOMS  Migraine headache symptoms (which often last 4 to 72 hours) include:  Intense, throbbing pain on one or both sides of the head.  Nausea.  Vomiting.  Being extra sensitive to light.  Being extra sensitive to sound.  Nervous system reactions that appear similar to an allergic reaction:  Stuffy nose.  Runny nose.  Tearing. Sinus headaches are felt as facial pain or pressure.  DIAGNOSIS  Because there is some overlap in symptoms, sinus and migraine headaches are often misdiagnosed. For example, a person with migraines may also feel facial pressure. Likewise, many people with hay fever may get  migraine headaches rather than sinus headaches. These migraines can be triggered by the histamine release during an allergic reaction. An antihistamine medicine can eliminate this pain. There are standard criteria that help clarify the difference between these headaches and related allergy or allergy-like symptoms. Your caregiver can use these criteria to determine the proper diagnosis and provide you the best care. TREATMENT  Migraine medicine may help people who have persistent migraine headaches even though their hay fever is controlled. For some people, anti-inflammatory treatments do not work to relieve migraines. Medicines called triptans (such as sumatriptan) can be helpful for those people. Document Released: 04/02/2003 Document Revised: 04/04/2011 Document Reviewed: 04/24/2009 [facility] Patient Information 2014 [place], [place].

## 2012-11-20 NOTE — ED Notes (Addendum)
Not feeling well x2 day - HA, pain in left arm and hand.  Took bp at [facility] today and bp was 222/136.  Not taking bp meds for months due to medicaid issues.

## 2012-11-24 NOTE — ED Provider Notes (Signed)
Medical screening examination/treatment/procedure(s) were performed by non-physician practitioner and as supervising physician I was immediately available for consultation/collaboration.  EKG Interpretation     Ventricular Rate:  78 PR Interval:  160 QRS Duration: 72 QT Interval:  386 QTC Calculation: 440 R Axis:   43 Text Interpretation:  Normal sinus rhythm with sinus arrhythmia Minimal voltage criteria for LVH, may be normal variant Borderline ECG No old tracing to compare             Ethelda Chick, MD 11/24/12 1528

## 2012-11-29 ENCOUNTER — Other Ambulatory Visit: Payer: Self-pay

## 2013-01-10 ENCOUNTER — Ambulatory Visit: Payer: Medicaid Other

## 2013-05-20 ENCOUNTER — Emergency Department (HOSPITAL_BASED_OUTPATIENT_CLINIC_OR_DEPARTMENT_OTHER)
Admission: EM | Admit: 2013-05-20 | Discharge: 2013-05-20 | Disposition: A | Discharge status: Home / Self Care | Payer: Medicaid Other | Attending: Emergency Medicine | Admitting: Emergency Medicine

## 2013-05-20 ENCOUNTER — Encounter (HOSPITAL_BASED_OUTPATIENT_CLINIC_OR_DEPARTMENT_OTHER): Payer: Self-pay | Admitting: Emergency Medicine

## 2013-05-20 DIAGNOSIS — I10 Essential (primary) hypertension: Secondary | ICD-10-CM | POA: Insufficient documentation

## 2013-05-20 DIAGNOSIS — R51 Headache: Secondary | ICD-10-CM | POA: Insufficient documentation

## 2013-05-20 DIAGNOSIS — Z8659 Personal history of other mental and behavioral disorders: Secondary | ICD-10-CM | POA: Insufficient documentation

## 2013-05-20 DIAGNOSIS — Z79899 Other long term (current) drug therapy: Secondary | ICD-10-CM | POA: Insufficient documentation

## 2013-05-20 DIAGNOSIS — R519 Headache, unspecified: Secondary | ICD-10-CM

## 2013-05-20 DIAGNOSIS — R42 Dizziness and giddiness: Secondary | ICD-10-CM | POA: Insufficient documentation

## 2013-05-20 HISTORY — DX: Palpitations: R00.2

## 2013-05-20 LAB — BASIC METABOLIC PANEL
BUN: 16 mg/dL (ref 6–23)
CALCIUM: 9.3 mg/dL (ref 8.4–10.5)
CO2: 29 mEq/L (ref 19–32)
Chloride: 103 mEq/L (ref 96–112)
Creatinine, Ser: 1.6 mg/dL — ABNORMAL HIGH (ref 0.50–1.10)
GFR calc non Af Amer: 39 mL/min — ABNORMAL LOW (ref >90)
GFR, EST AFRICAN AMERICAN: 45 mL/min — AB (ref >90)
GLUCOSE: 116 mg/dL — AB (ref 70–99)
Potassium: 3.8 mEq/L (ref 3.7–5.3)
Sodium: 143 mEq/L (ref 137–147)

## 2013-05-20 MED ORDER — ACETAMINOPHEN 325 MG PO TABS
ORAL_TABLET | ORAL | Status: AC
Start: 2013-05-20 — End: 2013-05-20
  Administered 2013-05-20: 650 mg
  Filled 2013-05-20: qty 2

## 2013-05-20 MED ORDER — HYDROCHLOROTHIAZIDE 25 MG PO TABS: 25.0000 mg | ORAL_TABLET | Freq: Every day | ORAL | Status: DC

## 2013-05-20 MED ORDER — HYDROCHLOROTHIAZIDE 25 MG PO TABS
25.0000 mg | ORAL_TABLET | Freq: Once | ORAL | Status: AC
  Administered 2013-05-20: 25 mg via ORAL
  Filled 2013-05-20: qty 1

## 2013-05-20 NOTE — Discharge Instructions (Signed)

## 2013-05-20 NOTE — ED Notes (Signed)
MD at bedside. 

## 2013-05-20 NOTE — ED Notes (Signed)
Patient states she woke up this morning with a headache, blurry vision, and feels like her bp is elevated.  Has been out of her HCTZ for several months, but has taken the Lisinopril.  States she called her PCP and has an appointment this coming Thursday.

## 2013-05-20 NOTE — ED Provider Notes (Signed)
CSN: [id number]     Arrival date & time 05/20/13  1223 History   First MD Initiated Contact with Patient 05/20/13 1303     Chief Complaint  Patient presents with  . Hypertension     (Consider location/radiation/quality/duration/timing/severity/associated sxs/prior Treatment) Patient is a 43 y.o. female presenting with hypertension.  Hypertension   Pt with history of HTN was previously on lisinopril, switched to Bystolic and then to HCTZ due to side effects. She was doing well on HCTZ but ran out about 6 months ago. She was taking her lisinopril 'as needed' but today woke up with moderate frontal headache, mild dizziness. Pt denies any CP, SOB.   Past Medical History  Diagnosis Date  . Hypertension   . Anxiety   . Multiparity   . High-risk pregnancy   . Gestational diabetes     diet controlled per patient  . Heart palpitations    Past Surgical History  Procedure Laterality Date  . No past surgeries    . Cesarean section  02/06/2012    Procedure: CESAREAN SECTION;  Surgeon: Osborne Oman, MD;  Location: [facility];  Service: Obstetrics;  Laterality: N/A;  Primary Cesarean Section Delivery Baby Boy @ 0000, Apgars 8/9    Family History  Problem Relation Age of Onset  . Diabetes Mother   . Hypertension Father   . Diabetes Father   . COPD Father   . Asthma Father   . Other Neg Hx    History  Substance Use Topics  . Smoking status: Never Smoker   . Smokeless tobacco: Never Used  . Alcohol Use: No     Comment: none since conception   OB History   Grav Para Term Preterm Abortions TAB SAB Ect Mult Living   8 8 8       8      Review of Systems All other systems reviewed and are negative except as noted in HPI.     Allergies  Dilaudid  Home Medications   Prior to Admission medications   Medication Sig Start Date End Date Taking? Authorizing Provider  ibuprofen (ADVIL,MOTRIN) 600 MG tablet Take 1 tablet (600 mg total) by mouth every 6 (six) hours as needed. 03/12/12   Yes Martha Clan, MD  lisinopril (PRINIVIL,ZESTRIL) 20 MG tablet Take 1 tablet (20 mg total) by mouth once. 11/20/12  Yes Hollace Kinnier Sofia, PA-C  hydrochlorothiazide (HYDRODIURIL) 25 MG tablet Take 1 tablet (25 mg total) by mouth once. 11/20/12   Fransico Meadow, PA-C  ibuprofen (ADVIL,MOTRIN) 600 MG tablet Take 600 mg by mouth every 6 (six) hours as needed for pain. 02/10/12   Seabron Spates, CNM  lisinopril-hydrochlorothiazide (ZESTORETIC) 10-12.5 MG per tablet Take 1 tablet by mouth daily. 03/12/12   Martha Clan, MD  metroNIDAZOLE (FLAGYL) 500 MG tablet Take 1 tablet (500 mg total) by mouth 2 (two) times daily. 03/15/12   Peggy Constant, MD   BP 163/103  Pulse 81  Temp(Src) 98 F (36.7 C) (Oral)  Resp 16  Ht 5' 6.75" (1.695 m)  Wt 243 lb (110.224 kg)  BMI 38.37 kg/m2  SpO2 100%  LMP 05/09/2013 Physical Exam  Nursing note and vitals reviewed. Constitutional: She is oriented to person, place, and time. She appears well-developed and well-nourished.  HENT:  Head: Normocephalic and atraumatic.  Eyes: EOM are normal. Pupils are equal, round, and reactive to light.  Neck: Normal range of motion. Neck supple.  Cardiovascular: Normal rate, normal heart sounds and intact distal pulses.  Pulmonary/Chest: Effort normal and breath sounds normal.  Abdominal: Bowel sounds are normal. She exhibits no distension. There is no tenderness.  Musculoskeletal: Normal range of motion. She exhibits no edema and no tenderness.  Neurological: She is alert and oriented to person, place, and time. She has normal strength. No cranial nerve deficit or sensory deficit.  Skin: Skin is warm and dry. No rash noted.  Psychiatric: She has a normal mood and affect.    ED Course  Procedures (including critical care time) Labs Review Labs Reviewed  BASIC METABOLIC PANEL - Abnormal; Notable for the following:    Glucose, Bld 116 (*)    Creatinine, Ser 1.60 (*)    GFR calc non Af Amer 39 (*)    GFR calc Af Amer 45  (*)    All other components within normal limits    Imaging Review No results found.  EKG not in MUSE  Date: 05/20/2013  Rate: 77  Rhythm: normal sinus rhythm  QRS Axis: normal  Intervals: normal  ST/T Wave abnormalities: nonspecific T wave changes  Conduction Disutrbances:none  Narrative Interpretation:   Old EKG Reviewed: unchanged    MDM   Final diagnoses:  HTN (hypertension)  Headache    Pt with mild increase in Cr above baseline. No significant acute EKG changes. Pt given HCTZ here and refill for same. Has appointment with PCP in 3 days.     Capri Veals B. Karle Starch, MD 05/20/13 [phone]

## 2013-05-20 NOTE — ED Notes (Signed)
Pt reports she hasn't had BP medication in 3 months but has been taking Lisinopril.  Headache this am and reports headaches in the past when BP was elevated.  Blurred vision x 2 days.

## 2013-11-25 ENCOUNTER — Encounter (HOSPITAL_BASED_OUTPATIENT_CLINIC_OR_DEPARTMENT_OTHER): Payer: Self-pay | Admitting: Emergency Medicine

## 2013-11-28 ENCOUNTER — Emergency Department (HOSPITAL_BASED_OUTPATIENT_CLINIC_OR_DEPARTMENT_OTHER): Payer: Medicaid Other

## 2013-11-28 ENCOUNTER — Emergency Department (HOSPITAL_BASED_OUTPATIENT_CLINIC_OR_DEPARTMENT_OTHER)
Admission: EM | Admit: 2013-11-28 | Discharge: 2013-11-28 | Disposition: A | Discharge status: Home / Self Care | Payer: Medicaid Other | Attending: Emergency Medicine | Admitting: Emergency Medicine

## 2013-11-28 ENCOUNTER — Encounter (HOSPITAL_BASED_OUTPATIENT_CLINIC_OR_DEPARTMENT_OTHER): Payer: Self-pay | Admitting: *Deleted

## 2013-11-28 DIAGNOSIS — R51 Headache: Secondary | ICD-10-CM | POA: Insufficient documentation

## 2013-11-28 DIAGNOSIS — R110 Nausea: Secondary | ICD-10-CM | POA: Insufficient documentation

## 2013-11-28 DIAGNOSIS — R0981 Nasal congestion: Secondary | ICD-10-CM | POA: Insufficient documentation

## 2013-11-28 DIAGNOSIS — Z79899 Other long term (current) drug therapy: Secondary | ICD-10-CM | POA: Insufficient documentation

## 2013-11-28 DIAGNOSIS — R202 Paresthesia of skin: Secondary | ICD-10-CM | POA: Insufficient documentation

## 2013-11-28 DIAGNOSIS — Z8632 Personal history of gestational diabetes: Secondary | ICD-10-CM | POA: Insufficient documentation

## 2013-11-28 DIAGNOSIS — H538 Other visual disturbances: Secondary | ICD-10-CM | POA: Insufficient documentation

## 2013-11-28 DIAGNOSIS — R079 Chest pain, unspecified: Secondary | ICD-10-CM | POA: Insufficient documentation

## 2013-11-28 DIAGNOSIS — Z8659 Personal history of other mental and behavioral disorders: Secondary | ICD-10-CM | POA: Insufficient documentation

## 2013-11-28 DIAGNOSIS — Z3202 Encounter for pregnancy test, result negative: Secondary | ICD-10-CM | POA: Insufficient documentation

## 2013-11-28 DIAGNOSIS — I10 Essential (primary) hypertension: Secondary | ICD-10-CM

## 2013-11-28 DIAGNOSIS — R519 Headache, unspecified: Secondary | ICD-10-CM

## 2013-11-28 LAB — CBC WITH DIFFERENTIAL/PLATELET
BASOS PCT: 0 % (ref 0–1)
Basophils Absolute: 0 10*3/uL (ref 0.0–0.1)
Eosinophils Absolute: 0.2 10*3/uL (ref 0.0–0.7)
Eosinophils Relative: 2 % (ref 0–5)
HCT: 39.5 % (ref 36.0–46.0)
Hemoglobin: 13 g/dL (ref 12.0–15.0)
LYMPHS ABS: 1.9 10*3/uL (ref 0.7–4.0)
Lymphocytes Relative: 27 % (ref 12–46)
MCH: 27.9 pg (ref 26.0–34.0)
MCHC: 32.9 g/dL (ref 30.0–36.0)
MCV: 84.8 fL (ref 78.0–100.0)
Monocytes Absolute: 0.4 10*3/uL (ref 0.1–1.0)
Monocytes Relative: 6 % (ref 3–12)
NEUTROS PCT: 65 % (ref 43–77)
Neutro Abs: 4.5 10*3/uL (ref 1.7–7.7)
PLATELETS: 375 10*3/uL (ref 150–400)
RBC: 4.66 MIL/uL (ref 3.87–5.11)
RDW: 13.4 % (ref 11.5–15.5)
WBC: 7 10*3/uL (ref 4.0–10.5)

## 2013-11-28 LAB — COMPREHENSIVE METABOLIC PANEL
ALBUMIN: 3.4 g/dL — AB (ref 3.5–5.2)
ALK PHOS: 86 U/L (ref 39–117)
ALT: 12 U/L (ref 0–35)
AST: 13 U/L (ref 0–37)
Anion gap: 12 (ref 5–15)
BUN: 13 mg/dL (ref 6–23)
CO2: 25 mEq/L (ref 19–32)
Calcium: 8.8 mg/dL (ref 8.4–10.5)
Chloride: 106 mEq/L (ref 96–112)
Creatinine, Ser: 1.2 mg/dL — ABNORMAL HIGH (ref 0.50–1.10)
GFR calc Af Amer: 63 mL/min — ABNORMAL LOW (ref >90)
GFR calc non Af Amer: 54 mL/min — ABNORMAL LOW (ref >90)
Glucose, Bld: 125 mg/dL — ABNORMAL HIGH (ref 70–99)
POTASSIUM: 3.7 meq/L (ref 3.7–5.3)
SODIUM: 143 meq/L (ref 137–147)
TOTAL PROTEIN: 7 g/dL (ref 6.0–8.3)
Total Bilirubin: 0.4 mg/dL (ref 0.3–1.2)

## 2013-11-28 LAB — PREGNANCY, URINE: Preg Test, Ur: NEGATIVE

## 2013-11-28 LAB — TROPONIN I: Troponin I: <0.3 ng/mL (ref <0.30)

## 2013-11-28 MED ORDER — METOCLOPRAMIDE HCL 5 MG/ML IJ SOLN
10.0000 mg | Freq: Once | INTRAMUSCULAR | Status: AC
  Administered 2013-11-28: 10 mg via INTRAVENOUS
  Filled 2013-11-28: qty 2

## 2013-11-28 MED ORDER — DEXAMETHASONE SODIUM PHOSPHATE 10 MG/ML IJ SOLN: 10.0000 mg | Freq: Once | INTRAMUSCULAR | Status: DC

## 2013-11-28 MED ORDER — SODIUM CHLORIDE 0.9 % IV BOLUS (SEPSIS)
500.0000 mL | Freq: Once | INTRAVENOUS | Status: AC
  Administered 2013-11-28: 500 mL via INTRAVENOUS

## 2013-11-28 MED ORDER — HYDROCHLOROTHIAZIDE 25 MG PO TABS: 25.0000 mg | ORAL_TABLET | Freq: Every day | ORAL | Status: DC

## 2013-11-28 MED ORDER — SODIUM CHLORIDE 0.9 % IV BOLUS (SEPSIS): 1000.0000 mL | INTRAVENOUS | Status: DC

## 2013-11-28 MED ORDER — KETOROLAC TROMETHAMINE 30 MG/ML IJ SOLN
30.0000 mg | Freq: Once | INTRAMUSCULAR | Status: AC
  Administered 2013-11-28: 30 mg via INTRAVENOUS
  Filled 2013-11-28: qty 1

## 2013-11-28 MED ORDER — DEXAMETHASONE 4 MG PO TABS
8.0000 mg | ORAL_TABLET | Freq: Once | ORAL | Status: AC
  Administered 2013-11-28: 8 mg via ORAL
  Filled 2013-11-28: qty 2

## 2013-11-28 MED ORDER — DIPHENHYDRAMINE HCL 50 MG/ML IJ SOLN
25.0000 mg | Freq: Once | INTRAMUSCULAR | Status: AC
  Administered 2013-11-28: 25 mg via INTRAVENOUS
  Filled 2013-11-28: qty 1

## 2013-11-28 MED ORDER — HYDRALAZINE HCL 20 MG/ML IJ SOLN
10.0000 mg | Freq: Once | INTRAMUSCULAR | Status: AC
  Administered 2013-11-28: 10 mg via INTRAVENOUS
  Filled 2013-11-28: qty 1

## 2013-11-28 NOTE — ED Notes (Signed)
Pt reports that for several weeks she has had intermittent dizziness, nausea, and headaches. No associated trauma.

## 2013-11-28 NOTE — ED Notes (Signed)
EDP made aware of abnormal vital signs

## 2013-11-28 NOTE — Discharge Instructions (Signed)
°Emergency Department Resource Guide °1) Find a Doctor and Pay Out of Pocket °Although you won't have to find out who is covered by your insurance plan, it is a good idea to ask around and get recommendations. You will then need to call the office and see if the doctor you have chosen will accept you as a new patient and what types of options they offer for patients who are self-pay. Some doctors offer discounts or will set up payment plans for their patients who do not have insurance, but you will need to ask so you aren't surprised when you get to your appointment. ° °2) Contact Your Local Health Department °Not all health departments have doctors that can see patients for sick visits, but many do, so it is worth a call to see if yours does. If you don't know where your local health department is, you can check in your phone book. The CDC also has a tool to help you locate your state's health department, and many state websites also have listings of all of their local health departments. ° °3) Find a Walk-in Clinic °If your illness is not likely to be very severe or complicated, you may want to try a walk in clinic. These are popping up all over the country in pharmacies, drugstores, and shopping centers. They're usually staffed by nurse practitioners or physician assistants that have been trained to treat common illnesses and complaints. They're usually fairly quick and inexpensive. However, if you have serious medical issues or chronic medical problems, these are probably not your best option. ° °No Primary Care Doctor: °- Call [facility] at  832-8000 - they can help you locate a primary care doctor that  accepts your insurance, provides certain services, etc. °- Physician Referral Service- 1-800-533-3463 ° °Chronic Pain Problems: °Organization         Address  Phone   Notes  °[facility]  (336) 297-2271 Patients need to be referred by their primary care doctor.  ° °Medication  Assistance: °Organization         Address  Phone   Notes  °[place] [facility] [address] °[place], [place] [address] (336) 641-8030 --Must be a resident of [place] °-- Must have NO insurance coverage whatsoever (no Medicaid/ Medicare, etc.) °-- The pt. MUST have a primary care doctor that directs their care regularly and follows them in the community °  °[facility]  (866) 331-1348   °[employer]  (888) 892-1162   ° °Agencies that provide inexpensive medical care: °Organization         Address  Phone   Notes  °[facility]  (336) 832-8035   °[facility]    (336) 832-7272   °[facility] [address] °[place], [place] [address] (336) 832-4777   °[facility] [address], °[place] (336) 271-4999   °[facility]    (336) 373-0678   °[facility]    (336) 272-1050   °[facility] ° [address], [place] Phone:  (336) 832-4444, Fax:  (336) 832-4440 Hours of Operation:  9 am - 6 pm, M-F.  Also accepts Medicaid/Medicare and self-pay.  °[facility] ° [address], [place] Phone: (336) 832-3150, Fax: (336) 832-3151. Hours of Operation:  8:30 am - 5:30 pm, M-F.  Also accepts Medicaid and self-pay.  °[facility] [place] [address], [place] Phone: (336) 878-6027   °[facility] [address], [place], [place] (336)723-1848, Ext. 123 Mondays & Thursdays: 7-9 AM.  First 15 patients are seen on a first come, first serve basis. °  ° °Medicaid-accepting [place] Providers: ° °Organization         Address  Phone   Notes  °[facility] [address], [place] (336) 641-2100 Also accepts self-pay patients.  °[facility] [address], [place] ° (336) 856-9996   °[facility] [address], [place]  (336) 288-8857   °[facility] [address] [place] [address], [place] (336) 299-7000   °Veita Bland [address], [place]  ° (336) 373-1557 Only accepts [place] Access Medicaid patients after they have their name applied to their card.  ° °Self-Pay (no insurance) in [place]: ° °Organization         Address  Phone   Notes  °Sickle Cell Patients, [facility] [address], [place] (336) 832-1970   °[facility] [address], [place] (336) 832-4400   °[facility] ° [address],  (336) 992-4800   °[facility]/Dr. Osei-Bonsu ° [address] [place] [address], [place] or [address], [place] (336) 841-8500 Phone number for both [place] and [place] locations is the same.  °[facility] [address], [place] (336) 299-0000   °[facility] [address] [place] [address], [place] or [address] (336) 852-7530 °(336) 878-2260   °[facility] [address], [place] (336) 350-1642, phone; (336) 294-5005, fax Sees patients 1st and 3rd Saturday of every month.  Must not qualify for public or private insurance (i.e. Medicaid, Medicare, [place] Health Choice, Veterans' Benefits) • Household income should be no more than 200% of the poverty level •The clinic cannot treat you if you are pregnant or think you are pregnant • Sexually transmitted diseases are not treated at the clinic.  ° ° °Dental Care: °Organization         Address  Phone  Notes  °[place] [facility] [address], [place] (336) 641-6152 Accepts children up to age 21 who are enrolled in Medicaid or [place] Health Choice; pregnant women with a Medicaid card; and children who have applied for Medicaid or [place] Health Choice, but were declined, whose parents can pay a reduced fee at time of service.  °[place]  [facility] [place]  [address], [place] (336) 641-7733 Accepts children up to age 21 who are enrolled in Medicaid or  Health Choice; pregnant women with a Medicaid card; and children who have applied for Medicaid or  Health Choice, but were declined, whose parents can pay a reduced fee at time of service.  °[facility] ° [address],  (336) 641-4533 Patients are seen by appointment only. Walk-ins are not accepted. [facility] will see patients 18 years of age and older. °Monday - Tuesday (8am-5pm) °Most Wednesdays (8:30-5pm) °$30 per visit, cash only  °[facility] ° [address], [place] (336) 641-4533 Patients are seen by appointment only. Walk-ins are not accepted. [facility] will see patients 18 years of age and older. °One   Wednesday Evening (Monthly: Volunteer Based).  $30 per visit, cash only  °[facility]  (919) 537-3737 for adults; Children under age 4, call Graduate Pediatric Dentistry at (919) [phone]. Children aged 4-14, please call (919) [phone] to request a pediatric application. ° Dental services are provided in all areas of dental care including fillings, crowns and bridges, complete and partial dentures, implants, gum treatment, root canals, and extractions. Preventive care is also provided. Treatment is provided to both adults and children. °Patients are selected via a lottery and there is often a waiting list. °  °[facility] [address], °[place] ° (336) 763-8833 [email or web address] °  °[facility] [address], [place], [place] (336)723-1848, Ext. 123 Second and Fourth Thursday of each month, opens at 6:30 AM; Clinic ends at 9 AM.  Patients are seen on a first-come first-served basis, and a limited number are seen during each clinic.  ° °[facility] ° [address], [place], [place] (336) 723-7904    Eligibility Requirements °You must have lived in [place], [place], or [place] counties for at least the last three months. °  You cannot be eligible for state or federal sponsored healthcare insurance, including Veterans Administration, Medicaid, or Medicare. °  You generally cannot be eligible for healthcare insurance through your employer.  °  How to apply: °Eligibility screenings are held every Tuesday and Wednesday afternoon from 1:00 pm until 4:00 pm. You do not need an appointment for the interview!  °[facility] [address], [place], [place] 336-631-2330   °[facility]  336-342-8273   °[facility]  336-703-3100   °[facility]  336-570-6415   ° °Behavioral Health Resources in the Community: °Intensive Outpatient Programs °Organization         Address  Phone  Notes  °[facility] [address], [place], [place] 336-878-6098   °[facility] [address], [place], [place] 336-832-9800   °[facility] [address], [place], [place] ° 336-882-2125   °[facility] [address],  °[place], [place] 1-800-853-5163 or 336-641-4981   °Substance Abuse Resources °Organization         Address  Phone  Notes  °[facility]  336-882-2125   °[facility]  336-784-9470   °[facility]  336-285-9073   °[facility]  336-845-3988   °Residential & Outpatient Substance Abuse Program  1-800-659-3381   °Psychological Services °Organization         Address  Phone  Notes  °[facility]  336- 832-9600   °[facility]  336- 378-7881   °[facility] [address], [place] 1-800-853-5163 or 336-641-4981   ° °Mobile Crisis Teams °Organization         Address  Phone  Notes  °[facility], Mobile Crisis Care Unit  1-877-626-1772   °Assertive °[facility] ° [address]  [place], [place] 336-834-9664   °Sharon DeEsch [address] °[place] 336-554-5454   ° °Self-Help/Support Groups °Organization         Address  Phone             Notes  °[facility] - variety of support groups  336- 373-1402 Call for more information  °Narcotics Anonymous (NA), [facility] [address], °[place]   2 meetings at this location  ° °  Residential Treatment Programs °Organization         Address  Phone  Notes  °[facility] [address],    °[place]  1-866-801-8205   °[facility] ° [address], [place], [place] 704-293-8524   °[facility] [address], [place] 336-845-3988 Admissions: 8am-3pm M-F  °[facility] [address]    °[place], [place] 336-841-1104   °[facility] [address], [place], [place] 336-379-7146   °[facility] [address]  °[place], [place] 336-285-9073   °[facility] Intensive Outpatient [address], [place], [place] 336-852-3033   °[facility] [address]  °[place], [place] 1-877-615-2722 or 336-784-9470   °[facility] [address] [place], [place] 336-227-7417 Accepts Medicaid  °[facility] [address]  ° [place] 1-800-659-3381 Substance Abuse/Addiction Treatment  ° °[place] Behavioral Health Resources °Organization         Address  Phone  Notes  °[facility]  (888) 581-9988   °Julie Brannon, PhD [address] [place], [place]   (336) 349-5553 or (336) 951-0000   °[facility]   [address] °[place], [place] (336) 349-4454   °[facility] [address], [place], [place] (336) 342-8316 Insurance/Medicaid/sponsorship through [facility]  °[facility] [address]                                    [place], [place] (336) 342-8316 Therapy/tele-psych/case    °[facility] [address]  ° [place], [place] (336) 349-2233    °Dr. Arfeen  (336) 349-4544   °[facility] [place]  [employer] [place] [facility] 1) [address],  °2) [address], [place] °3)  [address], [place] (336) 349-3220 °(336) 342-7768 ° °(336) 342-8140   °[place] [facility] (336) 342-1394 or (336) 342-3537 (After Hours)    ° ° °

## 2013-11-28 NOTE — ED Provider Notes (Signed)
CSN: [id number]     Arrival date & time 11/28/13  0945 History   First MD Initiated Contact with Patient 11/28/13 1102     Chief Complaint  Patient presents with  . Headache  . Blurred Vision  . Nausea     (Consider location/radiation/quality/duration/timing/severity/associated sxs/prior Treatment) Patient is a 43 y.o. female presenting with headaches. The history is provided by the patient.  Headache Pain location:  R temporal and L temporal Quality: pressure. Radiates to:  Does not radiate Severity currently:  7/10 Severity at highest:  10/10 Onset quality:  Gradual Duration:  3 weeks Timing: initially intermittent, now constant. Progression:  Unchanged Chronicity:  New Similar to prior headaches: yes   Context: activity   Relieved by:  Nothing Worsened by:  Nothing tried Ineffective treatments:  NSAIDs Associated symptoms: congestion   Associated symptoms: no abdominal pain, no back pain, no cough, no diarrhea, no dizziness, no pain, no fatigue, no fever, no nausea, no neck pain and no vomiting     Past Medical History  Diagnosis Date  . Hypertension   . Anxiety   . Multiparity   . High-risk pregnancy   . Gestational diabetes     diet controlled per patient  . Heart palpitations    Past Surgical History  Procedure Laterality Date  . No past surgeries    . Cesarean section  02/06/2012    Procedure: CESAREAN SECTION;  Surgeon: Osborne Oman, MD;  Location: [facility];  Service: Obstetrics;  Laterality: N/A;  Primary Cesarean Section Delivery Baby Boy @ 0000, Apgars 8/9    Family History  Problem Relation Age of Onset  . Diabetes Mother   . Hypertension Father   . Diabetes Father   . COPD Father   . Asthma Father   . Other Neg Hx    History  Substance Use Topics  . Smoking status: Never Smoker   . Smokeless tobacco: Never Used  . Alcohol Use: No     Comment: none since conception   OB History    Gravida Para Term Preterm AB TAB SAB Ectopic Multiple  Living   8 8 8       8      Review of Systems  Constitutional: Negative for fever and fatigue.  HENT: Positive for congestion. Negative for drooling.   Eyes: Positive for visual disturbance. Negative for pain.  Respiratory: Negative for cough and shortness of breath.   Cardiovascular: Positive for chest pain.  Gastrointestinal: Negative for nausea, vomiting, abdominal pain and diarrhea.  Genitourinary: Negative for dysuria and hematuria.  Musculoskeletal: Negative for back pain, gait problem and neck pain.  Skin: Negative for color change.  Neurological: Positive for headaches. Negative for dizziness.       Paresthesias in fingers of left hand  Hematological: Negative for adenopathy.  Psychiatric/Behavioral: Negative for behavioral problems.  All other systems reviewed and are negative.     Allergies  Dilaudid  Home Medications   Prior to Admission medications   Medication Sig Start Date End Date Taking? Authorizing Provider  hydrochlorothiazide (HYDRODIURIL) 25 MG tablet Take 1 tablet (25 mg total) by mouth daily. 05/20/13   Charles B. Karle Starch, MD  ibuprofen (ADVIL,MOTRIN) 600 MG tablet Take 600 mg by mouth every 6 (six) hours as needed for pain. 02/10/12   Seabron Spates, CNM  ibuprofen (ADVIL,MOTRIN) 600 MG tablet Take 1 tablet (600 mg total) by mouth every 6 (six) hours as needed. 03/12/12   Martha Clan, MD   BP  154/107 mmHg  Pulse 83  Temp(Src) 99.3 F (37.4 C) (Oral)  Resp 12  Ht 5' 6.5" (1.689 m)  SpO2 100%  LMP 11/21/2013 Physical Exam  Constitutional: She is oriented to person, place, and time. She appears well-developed and well-nourished.  HENT:  Head: Normocephalic and atraumatic.  Mouth/Throat: Oropharynx is clear and moist. No oropharyngeal exudate.  20/15 vision in R eye, 20/20 vision in L eye.   Eyes: Conjunctivae and EOM are normal. Pupils are equal, round, and reactive to light.  Neck: Normal range of motion. Neck supple.  Cardiovascular: Normal  rate, regular rhythm, normal heart sounds and intact distal pulses.  Exam reveals no gallop and no friction rub.   No murmur heard. Pulmonary/Chest: Effort normal and breath sounds normal. No respiratory distress. She has no wheezes.  Abdominal: Soft. Bowel sounds are normal. There is no tenderness. There is no rebound and no guarding.  Musculoskeletal: Normal range of motion. She exhibits no edema or tenderness.  Neurological: She is alert and oriented to person, place, and time.  alert, oriented x3 speech: normal in context and clarity memory: intact grossly cranial nerves II-XII: intact motor strength: full proximally and distally no involuntary movements or tremors sensation: intact to light touch diffusely  cerebellar: finger-to-nose and heel-to-shin intact gait: normal gait   Skin: Skin is warm and dry.  Psychiatric: She has a normal mood and affect. Her behavior is normal.  Nursing note and vitals reviewed.   ED Course  Procedures (including critical care time) Labs Review Labs Reviewed  COMPREHENSIVE METABOLIC PANEL - Abnormal; Notable for the following:    Glucose, Bld 125 (*)    Creatinine, Ser 1.20 (*)    Albumin 3.4 (*)    GFR calc non Af Amer 54 (*)    GFR calc Af Amer 63 (*)    All other components within normal limits  CBC WITH DIFFERENTIAL  TROPONIN I    Imaging Review Dg Chest 2 View  11/28/2013   CLINICAL DATA:  Intermittent dizziness.  Nausea.  EXAM: CHEST  2 VIEW  COMPARISON:  None.  FINDINGS: The heart size and mediastinal contours are within normal limits. Both lungs are clear. The visualized skeletal structures are unremarkable.  IMPRESSION: No active cardiopulmonary disease.   Electronically Signed   By: [place]   On: 11/28/2013 10:57   Ct Head Wo Contrast  11/28/2013   CLINICAL DATA:  Initial encounter. Intermittent dizziness with headaches.  EXAM: CT HEAD WITHOUT CONTRAST  TECHNIQUE: Contiguous axial images were obtained from the base of the  skull through the vertex without intravenous contrast.  COMPARISON:  None.  FINDINGS: No mass lesion, mass effect, midline shift, hydrocephalus, hemorrhage. No territorial ischemia or acute infarction. Visible paranasal sinuses and mastoid air cells are within normal limits.  IMPRESSION: Negative CT head.   Electronically Signed   By: Dereck Ligas M.D.   On: 11/28/2013 11:10     EKG Interpretation   Date/Time:  Thursday November 28 2013 09:54:01 EST Ventricular Rate:  78 PR Interval:  160 QRS Duration: 74 QT Interval:  396 QTC Calculation: 451 R Axis:   65 Text Interpretation:  Normal sinus rhythm with sinus arrhythmia Possible  Left atrial enlargement Borderline ECG No significant change since last  tracing Confirmed by Smrithi Pigford  MD, Galaxy Borden ([id number]) on 11/28/2013 10:00:26 AM      MDM   Final diagnoses:  Hypertension  Blurry vision    11:19 AM 43 y.o. female who presents with waxing  waning headache over the last few weeks. She denies any head injuries or fever. She is supposed to be taking blood pressure medicine but has not been taking it for the last 6 months due to financial reasons. She also notes some gradually worsening blurry vision over the last few weeks. She is hypertensive here. Other vital signs unremarkable. We'll get screening labs and imaging. Will treat headache with a migraine cocktail. She has a normal neurologic exam. She also has normal vision on my exam.  12:50 PM: symptoms completely resolved with treatment. Imaging and labs noncontributory. I urged the importance of the patient taking her blood pressure medicine as she was hypertensive here today. Her blood pressure has titrated down well with treatment of her headache and some hydralazine. I have discussed the diagnosis/risks/treatment options with the patient and believe the pt to be eligible for discharge home to follow-up with her pcp for further BP mgmt. We also discussed returning to the ED immediately if new  or worsening sx occur. We discussed the sx which are most concerning (e.g., return of HA, fever, worsening vision) that necessitate immediate return. Medications administered to the patient during their visit and any new prescriptions provided to the patient are listed below.  Medications given during this visit Medications  sodium chloride 0.9 % bolus 500 mL (0 mLs Intravenous Stopped 11/28/13 1059)  hydrALAZINE (APRESOLINE) injection 10 mg (10 mg Intravenous Given 11/28/13 1034)  metoCLOPramide (REGLAN) injection 10 mg (10 mg Intravenous Given 11/28/13 1127)  diphenhydrAMINE (BENADRYL) injection 25 mg (25 mg Intravenous Given 11/28/13 1127)  sodium chloride 0.9 % bolus 500 mL (500 mLs Intravenous New Bag/Given 11/28/13 1127)  dexamethasone (DECADRON) tablet 8 mg (8 mg Oral Given 11/28/13 1127)  ketorolac (TORADOL) 30 MG/ML injection 30 mg (30 mg Intravenous Given 11/28/13 1127)    New Prescriptions   HYDROCHLOROTHIAZIDE (HYDRODIURIL) 25 MG TABLET    Take 1 tablet (25 mg total) by mouth daily.     Pamella Pert, MD 11/28/13 1251

## 2013-11-28 NOTE — ED Notes (Signed)
MD at bedside. 

## 2013-11-28 NOTE — ED Notes (Signed)
Pt reports that she doesn't need a 30 day supply of HTN medication. Reports she quit taking her BP medication 6 months ago. Sts "I dont need it." Informed pt that HTN medications are to be taken everyday

## 2013-12-16 ENCOUNTER — Encounter (HOSPITAL_BASED_OUTPATIENT_CLINIC_OR_DEPARTMENT_OTHER): Payer: Self-pay | Admitting: *Deleted

## 2013-12-16 ENCOUNTER — Emergency Department (HOSPITAL_BASED_OUTPATIENT_CLINIC_OR_DEPARTMENT_OTHER)
Admission: EM | Admit: 2013-12-16 | Discharge: 2013-12-16 | Disposition: A | Discharge status: Home / Self Care | Payer: Medicaid Other | Attending: Emergency Medicine | Admitting: Emergency Medicine

## 2013-12-16 DIAGNOSIS — Z79899 Other long term (current) drug therapy: Secondary | ICD-10-CM | POA: Insufficient documentation

## 2013-12-16 DIAGNOSIS — Z3202 Encounter for pregnancy test, result negative: Secondary | ICD-10-CM | POA: Insufficient documentation

## 2013-12-16 DIAGNOSIS — I10 Essential (primary) hypertension: Secondary | ICD-10-CM

## 2013-12-16 DIAGNOSIS — Z8632 Personal history of gestational diabetes: Secondary | ICD-10-CM | POA: Insufficient documentation

## 2013-12-16 DIAGNOSIS — Z8659 Personal history of other mental and behavioral disorders: Secondary | ICD-10-CM | POA: Insufficient documentation

## 2013-12-16 DIAGNOSIS — R5383 Other fatigue: Secondary | ICD-10-CM | POA: Insufficient documentation

## 2013-12-16 LAB — URINALYSIS, ROUTINE W REFLEX MICROSCOPIC
Bilirubin Urine: NEGATIVE
Glucose, UA: NEGATIVE mg/dL
Ketones, ur: NEGATIVE mg/dL
Nitrite: NEGATIVE
PH: 5.5 (ref 5.0–8.0)
PROTEIN: NEGATIVE mg/dL
Specific Gravity, Urine: 1.018 (ref 1.005–1.030)
Urobilinogen, UA: 1 mg/dL (ref 0.0–1.0)

## 2013-12-16 LAB — URINE MICROSCOPIC-ADD ON

## 2013-12-16 LAB — PREGNANCY, URINE: PREG TEST UR: NEGATIVE

## 2013-12-16 MED ORDER — ENALAPRIL-HYDROCHLOROTHIAZIDE 5-12.5 MG PO TABS: 1.0000 | ORAL_TABLET | Freq: Every day | ORAL | Status: DC

## 2013-12-16 MED ORDER — ACETAMINOPHEN 500 MG PO TABS
1000.0000 mg | ORAL_TABLET | Freq: Once | ORAL | Status: AC
  Administered 2013-12-16: 1000 mg via ORAL
  Filled 2013-12-16: qty 2

## 2013-12-16 NOTE — ED Notes (Signed)
Pt c/o increased BP x 2 days also c/o h/a

## 2013-12-16 NOTE — ED Provider Notes (Signed)
CSN: [id number]     Arrival date & time 12/16/13  1549 History  This chart was scribed for Orlie Dakin, MD by Rayfield Citizen, ED Scribe. This patient was seen in room MH08/MH08 and the patient's care was started at 4:30 PM.    Chief Complaint  Patient presents with  . Hypertension   The history is provided by the patient and a parent. No language interpreter was used.     HPI Comments: Tina Richardson is a 43 y.o. female who presents to the Emergency Department complaining of increased blood pressure for 2 days, with associated gradual onset, dull headache beginning this morning between 0700 and 1200 and worsening with time. She also reports general fatgiue. Patient had lab work and CT scan of her brain performed here 11/28/2013, when she presented with headache. She was started on HCTZ on 11/28/2013. Last dose was 9 PM yesterday  Patient reports she had been taking HCTZ intermittently (prescribed to be taken daily) since 2014 but did not feel it was effective, as she was still experiencing symptoms of high blood pressure (headaches, general ill feeling). She forgot to take her dose on 12/13/13 and did not take it for the next two days. She took her BP last night due to headache (recalled numbers 189/112) and took a dose of her HCTZ. She took her BP again this morning (recalled numbers 189/116). She has not taken a dose of HCTZ today. No other treatments or medications tried PTA. She was previously seeing Dr. Vista Lawman but he no longer accepts her insurance; she is looking for a PCP at this time.   She has poor vision generally but reports that her vision has been blurrier for the past few months. She had gestational diabetes. She denies any other medical concerns. Patient denies smoking, reports occasional EtOH use, denies the use of illegal drugs.. Nothing makes symptoms better or worse. No other associated symptoms. She took no medications were no treatment for her headache  Past Medical  History  Diagnosis Date  . Hypertension   . Anxiety   . Multiparity   . High-risk pregnancy   . Gestational diabetes     diet controlled per patient  . Heart palpitations    Past Surgical History  Procedure Laterality Date  . No past surgeries    . Cesarean section  02/06/2012    Procedure: CESAREAN SECTION;  Surgeon: Osborne Oman, MD;  Location: [facility];  Service: Obstetrics;  Laterality: N/A;  Primary Cesarean Section Delivery Baby Boy @ 0000, Apgars 8/9    Family History  Problem Relation Age of Onset  . Diabetes Mother   . Hypertension Father   . Diabetes Father   . COPD Father   . Asthma Father   . Other Neg Hx    History  Substance Use Topics  . Smoking status: Never Smoker   . Smokeless tobacco: Never Used  . Alcohol Use: No     Comment: none since conception   OB History    Gravida Para Term Preterm AB TAB SAB Ectopic Multiple Living   8 8 8       8      Review of Systems  Constitutional: Positive for fatigue.  Respiratory: Negative.   Cardiovascular: Negative.   Gastrointestinal: Negative.   Musculoskeletal: Negative.   Skin: Negative.   Neurological: Positive for headaches.  Psychiatric/Behavioral: Negative.   All other systems reviewed and are negative.   Allergies  Dilaudid  Home Medications   Prior to  Admission medications   Medication Sig Start Date End Date Taking? Authorizing Provider  nebivolol (BYSTOLIC) 5 MG tablet Take 5 mg by mouth daily.   Yes Historical Provider, MD  hydrochlorothiazide (HYDRODIURIL) 25 MG tablet Take 1 tablet (25 mg total) by mouth daily. 05/20/13   Charles B. Karle Starch, MD  hydrochlorothiazide (HYDRODIURIL) 25 MG tablet Take 1 tablet (25 mg total) by mouth daily. 11/28/13   Pamella Pert, MD  ibuprofen (ADVIL,MOTRIN) 600 MG tablet Take 600 mg by mouth every 6 (six) hours as needed for pain. 02/10/12   Seabron Spates, CNM  ibuprofen (ADVIL,MOTRIN) 600 MG tablet Take 1 tablet (600 mg total) by mouth every 6 (six)  hours as needed. 03/12/12   Martha Clan, MD   BP 204/109 mmHg  Pulse 75  Temp(Src) 98.6 F (37 C)  Resp 18  Ht 5\' 6"  (1.676 m)  Wt 220 lb (99.791 kg)  BMI 35.53 kg/m2  SpO2 96%  LMP 11/21/2013 Physical Exam  Constitutional: She is oriented to person, place, and time. She appears well-developed and well-nourished.  HENT:  Head: Normocephalic and atraumatic.  Right Ear: External ear normal.  Left Ear: External ear normal.  Eyes: Conjunctivae are normal. Pupils are equal, round, and reactive to light.  Fundi not well visualized  Neck: Neck supple. No tracheal deviation present. No thyromegaly present.  No bruit  Cardiovascular: Normal rate and regular rhythm.   No murmur heard. Pulmonary/Chest: Effort normal and breath sounds normal.  Abdominal: Soft. Bowel sounds are normal. She exhibits no distension. There is no tenderness.  Musculoskeletal: Normal range of motion. She exhibits no edema or tenderness.  Neurological: She is alert and oriented to person, place, and time. She has normal reflexes. Coordination normal.  Gait normal Romberg normal pronator drift normal DTR symmetric bilaterally knee jerk ankle jerk biceps disorder bilaterally finger-nose normal  Skin: Skin is warm and dry. No rash noted.  Psychiatric: She has a normal mood and affect.  Nursing note and vitals reviewed.   ED Course  Procedures   DIAGNOSTIC STUDIES: Oxygen Saturation is 96% on RA, adequate by my interpretation.    COORDINATION OF CARE: 4:40 PM Discussed treatment plan with pt at bedside and pt agreed to plan.   Labs Review Labs Reviewed  URINALYSIS, ROUTINE W REFLEX MICROSCOPIC - Abnormal; Notable for the following:    APPearance CLOUDY (*)    Hgb urine dipstick SMALL (*)    Leukocytes, UA SMALL (*)    All other components within normal limits  URINE MICROSCOPIC-ADD ON - Abnormal; Notable for the following:    Squamous Epithelial / LPF FEW (*)    Bacteria, UA MANY (*)    All other  components within normal limits  PREGNANCY, URINE    Imaging Review No results found.   EKG Interpretation None      MDM  No signs of hypertensive emergency. No signs of hypertensive encephalopathy plan Tylenol for pain. Prescription enalapril-HCTZ. Referral resource guide. Blood pressure recheck one week Diagnosis #1 hypertension #2 nonspecific headache Final diagnoses:  None     I personally performed the services described in this documentation, which was scribed in my presence. The recorded information has been reviewed and considered.     Orlie Dakin, MD 12/16/13 [phone]

## 2013-12-16 NOTE — ED Notes (Signed)
PT discharged to home with family. NAD. 

## 2013-12-16 NOTE — Discharge Instructions (Signed)
Hypertension Start taking the medication prescribed tonight for blood pressure. Take Tylenol as directed for headaches. Call any of the numbers on the resource guide to get a primary care physician. Call tomorrow. Your blood pressure should be rechecked in one week. Go to an urgent care center if you're unable to get a primary care physician before next week Hypertension, commonly called high blood pressure, is when the force of blood pumping through your arteries is too strong. Your arteries are the blood vessels that carry blood from your heart throughout your body. A blood pressure reading consists of a higher number over a lower number, such as 110/72. The higher number (systolic) is the pressure inside your arteries when your heart pumps. The lower number (diastolic) is the pressure inside your arteries when your heart relaxes. Ideally you want your blood pressure below 120/80. Hypertension forces your heart to work harder to pump blood. Your arteries may become narrow or stiff. Having hypertension puts you at risk for heart disease, stroke, and other problems.  RISK FACTORS Some risk factors for high blood pressure are controllable. Others are not.  Risk factors you cannot control include:   Race. You may be at higher risk if you are African American.  Age. Risk increases with age.  Gender. Men are at higher risk than women before age 57 years. After age 61, women are at higher risk than men. Risk factors you can control include:  Not getting enough exercise or physical activity.  Being overweight.  Getting too much fat, sugar, calories, or salt in your diet.  Drinking too much alcohol. SIGNS AND SYMPTOMS Hypertension does not usually cause signs or symptoms. Extremely high blood pressure (hypertensive crisis) may cause headache, anxiety, shortness of breath, and nosebleed. DIAGNOSIS  To check if you have hypertension, your health care provider will measure your blood pressure while  you are seated, with your arm held at the level of your heart. It should be measured at least twice using the same arm. Certain conditions can cause a difference in blood pressure between your right and left arms. A blood pressure reading that is higher than normal on one occasion does not mean that you need treatment. If one blood pressure reading is high, ask your health care provider about having it checked again. TREATMENT  Treating high blood pressure includes making lifestyle changes and possibly taking medicine. Living a healthy lifestyle can help lower high blood pressure. You may need to change some of your habits. Lifestyle changes may include:  Following the DASH diet. This diet is high in fruits, vegetables, and whole grains. It is low in salt, red meat, and added sugars.  Getting at least 2 hours of brisk physical activity every week.  Losing weight if necessary.  Not smoking.  Limiting alcoholic beverages.  Learning ways to reduce stress. If lifestyle changes are not enough to get your blood pressure under control, your health care provider may prescribe medicine. You may need to take more than one. Work closely with your health care provider to understand the risks and benefits. HOME CARE INSTRUCTIONS  Have your blood pressure rechecked as directed by your health care provider.   Take medicines only as directed by your health care provider. Follow the directions carefully. Blood pressure medicines must be taken as prescribed. The medicine does not work as well when you skip doses. Skipping doses also puts you at risk for problems.   Do not smoke.   Monitor your blood pressure at  home as directed by your health care provider. SEEK MEDICAL CARE IF:   You think you are having a reaction to medicines taken.  You have recurrent headaches or feel dizzy.  You have swelling in your ankles.  You have trouble with your vision. SEEK IMMEDIATE MEDICAL CARE IF:  You  develop a severe headache or confusion.  You have unusual weakness, numbness, or feel faint.  You have severe chest or abdominal pain.  You vomit repeatedly.  You have trouble breathing. MAKE SURE YOU:   Understand these instructions.  Will watch your condition.  Will get help right away if you are not doing well or get worse. Document Released: 01/10/2005 Document Revised: 05/27/2013 Document Reviewed: 11/02/2012 [facility] Patient Information 2015 [place], [place]. This information is not intended to replace advice given to you by your health care provider. Make sure you discuss any questions you have with your health care provider.  Emergency Department Resource Guide 1) Find a Doctor and Pay Out of Pocket Although you won't have to find out who is covered by your insurance plan, it is a good idea to ask around and get recommendations. You will then need to call the office and see if the doctor you have chosen will accept you as a new patient and what types of options they offer for patients who are self-pay. Some doctors offer discounts or will set up payment plans for their patients who do not have insurance, but you will need to ask so you aren't surprised when you get to your appointment.  2) Contact Your Local Health Department Not all health departments have doctors that can see patients for sick visits, but many do, so it is worth a call to see if yours does. If you don't know where your local health department is, you can check in your phone book. The CDC also has a tool to help you locate your state's health department, and many state websites also have listings of all of their local health departments.  3) Find a [facility] If your illness is not likely to be very severe or complicated, you may want to try a walk in clinic. These are popping up all over the country in pharmacies, drugstores, and shopping centers. They're usually staffed by nurse practitioners or physician  assistants that have been trained to treat common illnesses and complaints. They're usually fairly quick and inexpensive. However, if you have serious medical issues or chronic medical problems, these are probably not your best option.  No Primary Care Doctor: - Call Health Connect at  [phone] - they can help you locate a primary care doctor that  accepts your insurance, provides certain services, etc. - Physician Referral Service- [phone]  Chronic Pain Problems: Organization         Address  Phone   Notes  [facility]  [phone] Patients need to be referred by their primary care doctor.   Medication Assistance: Organization         Address  Phone   Notes  [facility] Medication [facility] [address] [place], [place] [address] [phone] --Must be a resident of [facility] -- Must have NO insurance coverage whatsoever (no Medicaid/ Medicare, etc.) -- The pt. MUST have a primary care doctor that directs their care regularly and follows them in the community   MedAssist  [phone]   [employer]  [phone]    Agencies that provide inexpensive medical care:  Organization         Address  Phone   Notes  [place]  [phone]   [facility]    [phone]   [facility] [place], [place] [address] [phone]   [place] [address], [place] [phone]   Planned Parenthood    [phone]   [facility]    [phone]   [place] and [place] [address], [place] Phone:  [phone], Fax:  [phone] Hours of Operation:  9 am - 6 pm, M-F.  Also accepts Medicaid/Medicare and self-pay.  [facility] for [place] [address], [address], [place] Phone: [phone], Fax: [phone]. Hours of Operation:  8:30 am - 5:30 pm, M-F.  Also accepts  Medicaid and self-pay.  [facility] [place] [address] [place] Phone: [phone]   [place], [place], [place] [phone], Ext. [phone] Mondays & Thursdays: 7-9 AM.  First 15 patients are seen on a first come, first serve basis.    [place] Providers:  Organization         Address  Phone   Notes  [facility] [address], [place] [phone] Also accepts self-pay patients.  [facility] [address], [place]  [phone]   [place], [address], [place] [phone]   [facility] [address], [place] [phone]   Lucianne Lei [address], [place]   [phone] Only accepts [place] Access [place] patients after they have their name applied to their card.   Self-Pay (no insurance) in [facility]:  Organization         Address  Phone   Notes  Sickle Cell Patients, [facility] [place] [phone]   [facility] [place] [phone]   [facility] [place]  [place], [place], [place] [phone]   [facility]/Dr. Osei-Bonsu  [address] [place] or [address], [place] [phone] Phone number for both [place] and [place] locations is the same.  [facility] [address] [place] [phone]   [facility] [address], [place] or [address] [phone]   [facility] [address], [place] [phone], phone; [phone], fax Sees patients 1st and 3rd Saturday of every month.  Must not qualify for public or private insurance (i.e. Medicaid, Medicare, [place] Health Choice, Veterans' Benefits)  Household  income should be no more than 200% of the poverty level The clinic cannot treat you if you are pregnant or think you are pregnant  Sexually transmitted diseases are not treated at the clinic.    Dental Care: Organization         Address  Phone  Notes  [facility] Department of [facility] [place] [phone] Accepts children up to age 23 who are enrolled in [place] or [place]; pregnant women with a Medicaid card; and children who have applied for Medicaid or [place] Health Choice, but were declined,  whose parents can pay a reduced fee at time of service.  [facility] Department of [facility]  [address], [place] [phone] Accepts children up to age 24 who are enrolled in [place] or [place]; pregnant women with a Medicaid card; and children who have applied for Medicaid or [place] Health Choice, but were declined, whose parents can pay a reduced fee at time of service.  [place] Adult Dental Access PROGRAM  [place] [phone] Patients are seen by appointment only. Walk-ins are not accepted. [place] will see patients 71 years of age and older. Monday - Tuesday (8am-5pm) Most Wednesdays (8:30-5pm) $30 per visit, cash only  [facility] Adult Dental Access PROGRAM  [address], [facility] [phone] Patients are seen by appointment only. Walk-ins are not accepted. [place] will see patients 7 years of age and older. One Wednesday Evening (Monthly: Volunteer Based).  $30 per visit, cash only  [place]  [phone] for adults; Children under age 22, call Graduate Pediatric Dentistry at [phone]. Children aged 85-14, please call [phone] to request a pediatric application.  Dental services are provided in all areas of dental care including fillings, crowns and bridges, complete and partial dentures, implants, gum  treatment, root canals, and extractions. Preventive care is also provided. Treatment is provided to both adults and children. Patients are selected via a lottery and there is often a waiting list.   [facility] [address] [place]  [phone] [email or web address]   [facility] [address] [place], [place] [phone], Ext. [phone] Second and Fourth Thursday of each month, opens at 6:30 AM; Clinic ends at 9 AM.  Patients are seen on a first-come first-served basis, and a limited number are seen during each clinic.   [facility]  [address] Hillard Danker [place], [place] [phone]   Eligibility Requirements You must have lived in [place], [place], or [place] counties for at least the last three months.   You cannot be eligible for state or federal sponsored [employer], including [employer], [place], or [employer].   You generally cannot be eligible for healthcare insurance through your employer.    How to apply: Eligibility screenings are held every Tuesday and Wednesday afternoon from 1:00 pm until 4:00 pm. You do not need an appointment for the interview!  [facility] [address] [place], [place]   [place]  [place] Department  [place]  [phone]    Behavioral Health Resources in the Community: Intensive Outpatient Programs Organization         Address  Phone  Notes  [place]. [address] [place], [place] [phone]   [facility] Outpatient [address], [place], [place]   ADS: Alcohol & Drug Svcs [address] [place], [place]   [place] [address],  [place], [place] or [phone]   Substance Abuse Resources Organization         Address  Phone  Notes  Alcohol and  Drug Services  [phone]   [place]  [phone]   [facility] [place]   Chinita Pester  [phone]   Residential & Outpatient Substance Abuse Program  [phone]   Psychological Services Organization  Address  Phone  Notes  [place]  [place]  [phone]   [place] [address], [place] or [phone]    Mobile Crisis Teams Organization         Address  Phone  Notes  [facility], Mobile Crisis Care Unit  [phone]   [facility]  [address] [place], [place]   [place] Levels [address] [place] [phone]    Self-Help/Support Groups Organization         Address  Phone             Notes  [place]. of [place] - variety of support groups  [place] Call for more information  Narcotics Anonymous (NA), [facility] [address], [employer] [place]  2 meetings at this location   Special educational needs teacher         Address  Phone  Notes  [facility] [place],    [place]  [phone]   [facility]  [address] [place] [address], [place], [place]   [place], [place] [phone] Admissions: 8am-3pm M-F  Incentives Substance [place] [address],    [place], [place] [phone]   [facility] [address] [place], [place], [place]   [facility] [place] [address]  [place], [place]   [facility] Programs - Intensive Outpatient [address], [place], [place]   [facility] [address].,  [place], [place] [phone] or [phone]   [facility] (RTS) [address] [place], [place] Accepts Medicaid  Fellowship  [place] [address],  [place] [phone] Substance Abuse/Addiction Treatment   [facility] Organization         Address  Phone  Notes  [facility]  [phone]   Domenic Schwab, PhD [address] [place], [place]   [phone] or [phone]   [place], [place] [phone]   [facility] [address] [place], [place] [phone] Insurance/Medicaid/sponsorship through [facility] and Families [address], [address]                                    [place], [place] [phone] [place] [address] [place], [place] [phone]    Dr. Adele Schilder  [phone]   [facility] Dept. 1) [address], [place] 2) [place] 3)  [place] [address], [place] [phone]  [phone]   [place] [phone] or [phone] (After Hours)

## 2014-03-28 ENCOUNTER — Emergency Department (HOSPITAL_BASED_OUTPATIENT_CLINIC_OR_DEPARTMENT_OTHER)
Admission: EM | Admit: 2014-03-28 | Discharge: 2014-03-28 | Disposition: A | Discharge status: Home / Self Care | Payer: Medicaid Other | Attending: Emergency Medicine | Admitting: Emergency Medicine

## 2014-03-28 ENCOUNTER — Emergency Department (HOSPITAL_BASED_OUTPATIENT_CLINIC_OR_DEPARTMENT_OTHER): Payer: Medicaid Other

## 2014-03-28 ENCOUNTER — Encounter (HOSPITAL_BASED_OUTPATIENT_CLINIC_OR_DEPARTMENT_OTHER): Payer: Self-pay | Admitting: *Deleted

## 2014-03-28 DIAGNOSIS — Z79899 Other long term (current) drug therapy: Secondary | ICD-10-CM | POA: Insufficient documentation

## 2014-03-28 DIAGNOSIS — M791 Myalgia: Secondary | ICD-10-CM | POA: Insufficient documentation

## 2014-03-28 DIAGNOSIS — F419 Anxiety disorder, unspecified: Secondary | ICD-10-CM | POA: Insufficient documentation

## 2014-03-28 DIAGNOSIS — J069 Acute upper respiratory infection, unspecified: Secondary | ICD-10-CM | POA: Insufficient documentation

## 2014-03-28 DIAGNOSIS — R5383 Other fatigue: Secondary | ICD-10-CM | POA: Insufficient documentation

## 2014-03-28 DIAGNOSIS — I10 Essential (primary) hypertension: Secondary | ICD-10-CM | POA: Insufficient documentation

## 2014-03-28 DIAGNOSIS — Z3202 Encounter for pregnancy test, result negative: Secondary | ICD-10-CM | POA: Insufficient documentation

## 2014-03-28 DIAGNOSIS — Z8632 Personal history of gestational diabetes: Secondary | ICD-10-CM | POA: Insufficient documentation

## 2014-03-28 DIAGNOSIS — N390 Urinary tract infection, site not specified: Secondary | ICD-10-CM | POA: Insufficient documentation

## 2014-03-28 LAB — URINALYSIS, ROUTINE W REFLEX MICROSCOPIC
BILIRUBIN URINE: NEGATIVE
Glucose, UA: NEGATIVE mg/dL
KETONES UR: 15 mg/dL — AB
Nitrite: POSITIVE — AB
PH: 5.5 (ref 5.0–8.0)
Protein, ur: 100 mg/dL — AB
Specific Gravity, Urine: 1.021 (ref 1.005–1.030)
Urobilinogen, UA: 1 mg/dL (ref 0.0–1.0)

## 2014-03-28 LAB — BASIC METABOLIC PANEL
Anion gap: 5 (ref 5–15)
BUN: 15 mg/dL (ref 6–23)
CHLORIDE: 102 mmol/L (ref 96–112)
CO2: 25 mmol/L (ref 19–32)
Calcium: 8.5 mg/dL (ref 8.4–10.5)
Creatinine, Ser: 1.23 mg/dL — ABNORMAL HIGH (ref 0.50–1.10)
GFR calc Af Amer: 61 mL/min — ABNORMAL LOW (ref >90)
GFR calc non Af Amer: 53 mL/min — ABNORMAL LOW (ref >90)
GLUCOSE: 122 mg/dL — AB (ref 70–99)
POTASSIUM: 3.5 mmol/L (ref 3.5–5.1)
SODIUM: 132 mmol/L — AB (ref 135–145)

## 2014-03-28 LAB — PREGNANCY, URINE: Preg Test, Ur: NEGATIVE

## 2014-03-28 LAB — CBC WITH DIFFERENTIAL/PLATELET
BASOS ABS: 0 10*3/uL (ref 0.0–0.1)
Basophils Relative: 0 % (ref 0–1)
EOS PCT: 0 % (ref 0–5)
Eosinophils Absolute: 0 10*3/uL (ref 0.0–0.7)
HCT: 40.8 % (ref 36.0–46.0)
Hemoglobin: 13.4 g/dL (ref 12.0–15.0)
LYMPHS PCT: 30 % (ref 12–46)
Lymphs Abs: 2.5 10*3/uL (ref 0.7–4.0)
MCH: 27.1 pg (ref 26.0–34.0)
MCHC: 32.8 g/dL (ref 30.0–36.0)
MCV: 82.4 fL (ref 78.0–100.0)
Monocytes Absolute: 1 10*3/uL (ref 0.1–1.0)
Monocytes Relative: 13 % — ABNORMAL HIGH (ref 3–12)
NEUTROS ABS: 4.7 10*3/uL (ref 1.7–7.7)
Neutrophils Relative %: 57 % (ref 43–77)
PLATELETS: 269 10*3/uL (ref 150–400)
RBC: 4.95 MIL/uL (ref 3.87–5.11)
RDW: 13.8 % (ref 11.5–15.5)
WBC: 8.2 10*3/uL (ref 4.0–10.5)

## 2014-03-28 LAB — URINE MICROSCOPIC-ADD ON

## 2014-03-28 MED ORDER — CEPHALEXIN 500 MG PO CAPS: 500.0000 mg | ORAL_CAPSULE | Freq: Two times a day (BID) | ORAL | Status: DC

## 2014-03-28 MED ORDER — DEXTROSE 5 % IV SOLN
1.0000 g | Freq: Once | INTRAVENOUS | Status: AC
  Administered 2014-03-28: 1 g via INTRAVENOUS

## 2014-03-28 MED ORDER — SODIUM CHLORIDE 0.9 % IV BOLUS (SEPSIS)
1000.0000 mL | Freq: Once | INTRAVENOUS | Status: AC
  Administered 2014-03-28: 1000 mL via INTRAVENOUS

## 2014-03-28 MED ORDER — CEFTRIAXONE SODIUM 1 G IJ SOLR
INTRAMUSCULAR | Status: AC
  Filled 2014-03-28: qty 10

## 2014-03-28 MED ORDER — ACETAMINOPHEN 325 MG PO TABS
975.0000 mg | ORAL_TABLET | Freq: Once | ORAL | Status: AC
  Administered 2014-03-28: 975 mg via ORAL
  Filled 2014-03-28: qty 3

## 2014-03-28 MED ORDER — ACETAMINOPHEN 160 MG/5ML PO SOLN
975.0000 mg | Freq: Once | ORAL | Status: DC
  Filled 2014-03-28: qty 40.6

## 2014-03-28 NOTE — ED Provider Notes (Signed)
CSN: [id number]     Arrival date & time 03/28/14  1910 History  This chart was scribe for No att. providers found by Judithann Sauger, ED Scribe. The patient was seen in room MHOTF/OTF and the patient's care was started at 8:30 PM.    Chief Complaint  Patient presents with  . URI   The history is provided by the patient. No language interpreter was used.   HPI Comments: Tina Richardson is a 44 y.o. female who presents to the Emergency Department complaining of gradually worsening URI symptoms onset 1 week. She reports associated fever, sore throat, rhinorrhea, nasal congestion, diarrhea onset 2 days ago. She also reports night sweats, decreased appetite and intermittent HA. She states that she is currently dizzy and has cotton mouth. She adds that she had a dark yellow urine which burned when she gave her sample. She reports that her son had a cold.    Past Medical History  Diagnosis Date  . Hypertension   . Anxiety   . Multiparity   . High-risk pregnancy   . Gestational diabetes     diet controlled per patient  . Heart palpitations    Past Surgical History  Procedure Laterality Date  . No past surgeries    . Cesarean section  02/06/2012    Procedure: CESAREAN SECTION;  Surgeon: Osborne Oman, MD;  Location: [facility];  Service: Obstetrics;  Laterality: N/A;  Primary Cesarean Section Delivery Baby Boy @ 0000, Apgars 8/9    Family History  Problem Relation Age of Onset  . Diabetes Mother   . Hypertension Father   . Diabetes Father   . COPD Father   . Asthma Father   . Other Neg Hx    History  Substance Use Topics  . Smoking status: Never Smoker   . Smokeless tobacco: Never Used  . Alcohol Use: Yes     Comment: occaisionally   OB History    Gravida Para Term Preterm AB TAB SAB Ectopic Multiple Living   8 8 8       8      Review of Systems  Constitutional: Positive for fever and fatigue. Negative for chills and diaphoresis.  HENT: Positive for congestion, postnasal  drip, rhinorrhea and sore throat (None now). Negative for sneezing.   Eyes: Negative.   Respiratory: Positive for cough. Negative for chest tightness and shortness of breath.   Cardiovascular: Negative for chest pain and leg swelling.  Gastrointestinal: Negative for nausea, vomiting, abdominal pain, diarrhea and blood in stool.  Genitourinary: Positive for dysuria. Negative for frequency, hematuria, flank pain and difficulty urinating.  Musculoskeletal: Positive for myalgias. Negative for back pain and arthralgias.  Skin: Negative for rash.  Neurological: Positive for headaches. Negative for dizziness, speech difficulty, weakness and numbness.      Allergies  Dilaudid  Home Medications   Prior to Admission medications   Medication Sig Start Date End Date Taking? Authorizing Provider  cephALEXin (KEFLEX) 500 MG capsule Take 1 capsule (500 mg total) by mouth 2 (two) times daily. 03/28/14   Malvin Johns, MD  Enalapril-Hydrochlorothiazide 5-12.5 MG per tablet Take 1 tablet by mouth daily. 12/16/13   Orlie Dakin, MD  hydrochlorothiazide (HYDRODIURIL) 25 MG tablet Take 1 tablet (25 mg total) by mouth daily. 05/20/13   Charles B. Karle Starch, MD  hydrochlorothiazide (HYDRODIURIL) 25 MG tablet Take 1 tablet (25 mg total) by mouth daily. 11/28/13   Pamella Pert, MD  ibuprofen (ADVIL,MOTRIN) 600 MG tablet Take 600 mg by mouth  every 6 (six) hours as needed for pain. 02/10/12   Seabron Spates, CNM  ibuprofen (ADVIL,MOTRIN) 600 MG tablet Take 1 tablet (600 mg total) by mouth every 6 (six) hours as needed. 03/12/12   Martha Clan, MD  nebivolol (BYSTOLIC) 5 MG tablet Take 5 mg by mouth daily.    Historical Provider, MD   BP 139/97 mmHg  Pulse 88  Temp(Src) 98.6 F (37 C) (Oral)  Resp 18  Ht 5' 6.5" (1.689 m)  Wt 200 lb (90.719 kg)  BMI 31.80 kg/m2  SpO2 97%  LMP 03/20/2014 Physical Exam  Constitutional: She is oriented to person, place, and time. She appears well-developed and  well-nourished.  HENT:  Head: Normocephalic and atraumatic.  Mouth/Throat: Oropharynx is clear and moist.  Eyes: Pupils are equal, round, and reactive to light.  Neck: Normal range of motion. Neck supple.  No meningismus  Cardiovascular: Normal rate, regular rhythm and normal heart sounds.   Pulmonary/Chest: Effort normal and breath sounds normal. No respiratory distress. She has no wheezes. She has no rales. She exhibits no tenderness.  Abdominal: Soft. Bowel sounds are normal. There is no tenderness. There is no rebound and no guarding.  Musculoskeletal: Normal range of motion. She exhibits no edema.  Lymphadenopathy:    She has no cervical adenopathy.  Neurological: She is alert and oriented to person, place, and time.  Skin: Skin is warm and dry. No rash noted.  Psychiatric: She has a normal mood and affect.    ED Course  Procedures (including critical care time) DIAGNOSTIC STUDIES: Oxygen Saturation is 98% on RA, normal by my interpretation.    COORDINATION OF CARE: 8:38 PM- Pt advised of plan for treatment and pt agrees.    Labs Review Labs Reviewed  URINALYSIS, ROUTINE W REFLEX MICROSCOPIC - Abnormal; Notable for the following:    APPearance CLOUDY (*)    Hgb urine dipstick LARGE (*)    Ketones, ur 15 (*)    Protein, ur 100 (*)    Nitrite POSITIVE (*)    Leukocytes, UA MODERATE (*)    All other components within normal limits  BASIC METABOLIC PANEL - Abnormal; Notable for the following:    Sodium 132 (*)    Glucose, Bld 122 (*)    Creatinine, Ser 1.23 (*)    GFR calc non Af Amer 53 (*)    GFR calc Af Amer 61 (*)    All other components within normal limits  CBC WITH DIFFERENTIAL/PLATELET - Abnormal; Notable for the following:    Monocytes Relative 13 (*)    All other components within normal limits  URINE MICROSCOPIC-ADD ON - Abnormal; Notable for the following:    Bacteria, UA MANY (*)    All other components within normal limits  URINE CULTURE  PREGNANCY,  URINE    Imaging Review Dg Chest 2 View  03/28/2014   CLINICAL DATA:  Cough. Upper respiratory infection. Chills and diaphoresis.  EXAM: CHEST  2 VIEW  COMPARISON:  11/28/2013  FINDINGS: The heart size and mediastinal contours are within normal limits. Both lungs are clear. The visualized skeletal structures are unremarkable.  IMPRESSION: No active cardiopulmonary disease.   Electronically Signed   By: Earle Gell M.D.   On: 03/28/2014 20:40     EKG Interpretation None      MDM   Final diagnoses:  URI (upper respiratory infection)  UTI (lower urinary tract infection)    Patient presents with URI symptoms and some burning on urination. She  is well-appearing with some mild tachycardia. She was given IV fluids. She's also febrile here. She has evidence of a UTI. She was given a dose of Rocephin in the ED. Her urine was sent for culture. She has no vomiting. She's feeling much better after IV fluids and is ready to go home. She was discharged on Keflex. Return precautions were given. Her creatinine is elevated but it seems to be at baseline. She did not seem aware that she's had an elevated creatinine in the past and I encouraged her to follow-up with her primary care physician regarding this.  I personally performed the services described in this documentation, which was scribed in my presence.  The recorded information has been reviewed and considered.    Malvin Johns, MD 03/29/14 [id number]

## 2014-03-28 NOTE — ED Notes (Signed)
Patient transported to X-ray via stretcher per tech. 

## 2014-03-28 NOTE — ED Notes (Signed)
C/o URI sx, lists cough, congestion, HA, sore throat, hot sweats & cold chills, (sore throat & cough resolved), also reports sweat smells and urine is dark and smells. No meds PTA. Onset last weekend. Alert, NAD, calm, interactive, resps e/u, speaking in clear complete sentences.

## 2014-03-28 NOTE — Discharge Instructions (Signed)
Urinary Tract Infection Urinary tract infections (UTIs) can develop anywhere along your urinary tract. Your urinary tract is your body's drainage system for removing wastes and extra water. Your urinary tract includes two kidneys, two ureters, a bladder, and a urethra. Your kidneys are a pair of bean-shaped organs. Each kidney is about the size of your fist. They are located below your ribs, one on each side of your spine. CAUSES Infections are caused by microbes, which are microscopic organisms, including fungi, viruses, and bacteria. These organisms are so small that they can only be seen through a microscope. Bacteria are the microbes that most commonly cause UTIs. SYMPTOMS  Symptoms of UTIs may vary by age and gender of the patient and by the location of the infection. Symptoms in young women typically include a frequent and intense urge to urinate and a painful, burning feeling in the bladder or urethra during urination. Older women and men are more likely to be tired, shaky, and weak and have muscle aches and abdominal pain. A fever may mean the infection is in your kidneys. Other symptoms of a kidney infection include pain in your back or sides below the ribs, nausea, and vomiting. DIAGNOSIS To diagnose a UTI, your caregiver will ask you about your symptoms. Your caregiver also will ask to provide a urine sample. The urine sample will be tested for bacteria and white blood cells. White blood cells are made by your body to help fight infection. TREATMENT  Typically, UTIs can be treated with medication. Because most UTIs are caused by a bacterial infection, they usually can be treated with the use of antibiotics. The choice of antibiotic and length of treatment depend on your symptoms and the type of bacteria causing your infection. HOME CARE INSTRUCTIONS  If you were prescribed antibiotics, take them exactly as your caregiver instructs you. Finish the medication even if you feel better after you  have only taken some of the medication.  Drink enough water and fluids to keep your urine clear or pale yellow.  Avoid caffeine, tea, and carbonated beverages. They tend to irritate your bladder.  Empty your bladder often. Avoid holding urine for long periods of time.  Empty your bladder before and after sexual intercourse.  After a bowel movement, women should cleanse from front to back. Use each tissue only once. SEEK MEDICAL CARE IF:   You have back pain.  You develop a fever.  Your symptoms do not begin to resolve within 3 days. SEEK IMMEDIATE MEDICAL CARE IF:   You have severe back pain or lower abdominal pain.  You develop chills.  You have nausea or vomiting.  You have continued burning or discomfort with urination. MAKE SURE YOU:   Understand these instructions.  Will watch your condition.  Will get help right away if you are not doing well or get worse. Document Released: 10/20/2004 Document Revised: 07/12/2011 Document Reviewed: 02/18/2011 [facility] Patient Information 2015 [place], [place]. This information is not intended to replace advice given to you by your health care provider. Make sure you discuss any questions you have with your health care provider.  Upper Respiratory Infection, Adult An upper respiratory infection (URI) is also known as the common cold. It is often caused by a type of germ (virus). Colds are easily spread (contagious). You can pass it to others by kissing, coughing, sneezing, or drinking out of the same glass. Usually, you get better in 1 or 2 weeks.  HOME CARE   Only take medicine as  told by your doctor.  Use a warm mist humidifier or breathe in steam from a hot shower.  Drink enough water and fluids to keep your pee (urine) clear or pale yellow.  Get plenty of rest.  Return to work when your temperature is back to normal or as told by your doctor. You may use a face mask and wash your hands to stop your cold from spreading. GET  HELP RIGHT AWAY IF:   After the first few days, you feel you are getting worse.  You have questions about your medicine.  You have chills, shortness of breath, or brown or red spit (mucus).  You have yellow or brown snot (nasal discharge) or pain in the face, especially when you bend forward.  You have a fever, puffy (swollen) neck, pain when you swallow, or white spots in the back of your throat.  You have a bad headache, ear pain, sinus pain, or chest pain.  You have a high-pitched whistling sound when you breathe in and out (wheezing).  You have a lasting cough or cough up blood.  You have sore muscles or a stiff neck. MAKE SURE YOU:   Understand these instructions.  Will watch your condition.  Will get help right away if you are not doing well or get worse. Document Released: 06/29/2007 Document Revised: 04/04/2011 Document Reviewed: 04/17/2013 [facility] Patient Information 2015 [place], [place]. This information is not intended to replace advice given to you by your health care provider. Make sure you discuss any questions you have with your health care provider.

## 2014-03-31 LAB — URINE CULTURE: Colony Count: >=100000

## 2014-04-01 ENCOUNTER — Telehealth (HOSPITAL_BASED_OUTPATIENT_CLINIC_OR_DEPARTMENT_OTHER): Payer: Self-pay | Admitting: Emergency Medicine

## 2014-04-01 NOTE — Telephone Encounter (Signed)
Post ED Visit - Positive Culture Follow-up  Culture report reviewed by antimicrobial stewardship pharmacist: []  Wes Warren, Pharm.D., BCPS [x]  Heide Guile, Pharm.D., BCPS []  Alycia Rossetti, Pharm.D., BCPS []  [place], Pharm.D., BCPS, AAHIVP []  Legrand Como, Pharm.D., BCPS, AAHIVP []  Isac Sarna, Pharm.D., BCPS   Positive urine culture E/ Coli Treated with cephalexin, organism sensitive to the same and no further patient follow-up is required at this time.  Hazle Nordmann 04/01/2014, 2:38 PM

## 2014-06-18 DIAGNOSIS — F32A Depression, unspecified: Secondary | ICD-10-CM | POA: Insufficient documentation

## 2015-06-08 ENCOUNTER — Emergency Department (HOSPITAL_COMMUNITY): Payer: Medicaid Other

## 2015-06-08 ENCOUNTER — Emergency Department (HOSPITAL_COMMUNITY)
Admission: EM | Admit: 2015-06-08 | Discharge: 2015-06-08 | Disposition: A | Discharge status: Home / Self Care | Payer: Medicaid Other | Attending: Emergency Medicine | Admitting: Emergency Medicine

## 2015-06-08 ENCOUNTER — Encounter (HOSPITAL_COMMUNITY): Payer: Self-pay | Admitting: Emergency Medicine

## 2015-06-08 DIAGNOSIS — I10 Essential (primary) hypertension: Secondary | ICD-10-CM | POA: Diagnosis not present

## 2015-06-08 DIAGNOSIS — R05 Cough: Secondary | ICD-10-CM | POA: Diagnosis not present

## 2015-06-08 DIAGNOSIS — J029 Acute pharyngitis, unspecified: Secondary | ICD-10-CM | POA: Diagnosis not present

## 2015-06-08 DIAGNOSIS — R0789 Other chest pain: Secondary | ICD-10-CM | POA: Insufficient documentation

## 2015-06-08 DIAGNOSIS — R079 Chest pain, unspecified: Secondary | ICD-10-CM | POA: Diagnosis present

## 2015-06-08 DIAGNOSIS — Z658 Other specified problems related to psychosocial circumstances: Secondary | ICD-10-CM

## 2015-06-08 LAB — CBC
HCT: 36.8 % (ref 36.0–46.0)
HEMOGLOBIN: 11.9 g/dL — AB (ref 12.0–15.0)
MCH: 26.7 pg (ref 26.0–34.0)
MCHC: 32.3 g/dL (ref 30.0–36.0)
MCV: 82.7 fL (ref 78.0–100.0)
Platelets: 328 10*3/uL (ref 150–400)
RBC: 4.45 MIL/uL (ref 3.87–5.11)
RDW: 13.6 % (ref 11.5–15.5)
WBC: 8.5 10*3/uL (ref 4.0–10.5)

## 2015-06-08 LAB — BASIC METABOLIC PANEL
ANION GAP: 8 (ref 5–15)
BUN: 23 mg/dL — ABNORMAL HIGH (ref 6–20)
CALCIUM: 8.9 mg/dL (ref 8.9–10.3)
CO2: 27 mmol/L (ref 22–32)
Chloride: 102 mmol/L (ref 101–111)
Creatinine, Ser: 1.55 mg/dL — ABNORMAL HIGH (ref 0.44–1.00)
GFR calc Af Amer: 46 mL/min — ABNORMAL LOW (ref >60)
GFR, EST NON AFRICAN AMERICAN: 39 mL/min — AB (ref >60)
GLUCOSE: 139 mg/dL — AB (ref 65–99)
Potassium: 2.9 mmol/L — ABNORMAL LOW (ref 3.5–5.1)
SODIUM: 137 mmol/L (ref 135–145)

## 2015-06-08 LAB — I-STAT TROPONIN, ED: TROPONIN I, POC: 0.01 ng/mL (ref 0.00–0.08)

## 2015-06-08 NOTE — Congregational Nurse Program (Signed)
Congregational Nurse Program Note  Date of Encounter: 06/08/2015  Past Medical History: Past Medical History  Diagnosis Date  . Hypertension   . Anxiety   . Multiparity   . High-risk pregnancy   . Gestational diabetes     diet controlled per patient  . Heart palpitations     Encounter Details:     CNP Questionnaire - 06/08/15 2326    Patient Demographics   Is this a new or existing patient? New   Patient is considered a/an Not Applicable   Race African-American/Black   Patient Assistance   Location of Patient [place]   Patient's financial/insurance status Low Income   Uninsured Patient No   Patient referred to apply for the following financial assistance Not Applicable   Food insecurities addressed Referred to food bank or resource   Transportation assistance No   Assistance securing medications No   Educational health offerings Cardiac disease;Acute disease;Spiritual care;Navigating the healthcare system;Chronic disease   Encounter Details   Primary purpose of visit Chronic Illness/Condition Visit;Education/Health Concerns   Was an Emergency Department visit averted? No   Does patient have a medical provider? Yes   Patient referred to Clinic   Was a mental health screening completed? (GAINS tool) No   Does patient have dental issues? No   Does patient have vision issues? No   Does your patient have an abnormal blood pressure today? Yes   Since previous encounter, have you referred patient for abnormal blood pressure that resulted in a new diagnosis or medication change? No   Does your patient have an abnormal blood glucose today? No   Since previous encounter, have you referred patient for abnormal blood glucose that resulted in a new diagnosis or medication change? No   Was there a life-saving intervention made? No     client attended [facility].  Had blood pressure checked and was 169/94.  She was unable to get an appt with [facility] until July.  She will call tomorrow to see if she can get an appt sooner.  If she cant she is to call me.  Will followup on Thursday.  Client reports she has trouble with eating right as she has children at home and its hard to make ends meet.  Referred her to food bank at [facility].  She will check with the [facility] staff on Tues

## 2015-06-08 NOTE — ED Notes (Signed)
Pt lwbs 

## 2015-06-08 NOTE — ED Notes (Signed)
Pt. reports central chest pain onset 2 days ago , heartburn with sore throat and  productive cough . Denies SOB , no nausea or diaphoresis .

## 2015-06-09 ENCOUNTER — Ambulatory Visit (HOSPITAL_COMMUNITY)
Admission: EM | Admit: 2015-06-09 | Discharge: 2015-06-09 | Disposition: A | Discharge status: Home / Self Care | Payer: Medicaid Other | Attending: Family Medicine | Admitting: Family Medicine

## 2015-06-09 ENCOUNTER — Encounter (HOSPITAL_COMMUNITY): Payer: Self-pay | Admitting: Emergency Medicine

## 2015-06-09 DIAGNOSIS — R05 Cough: Secondary | ICD-10-CM | POA: Diagnosis not present

## 2015-06-09 DIAGNOSIS — R059 Cough, unspecified: Secondary | ICD-10-CM

## 2015-06-09 DIAGNOSIS — R0982 Postnasal drip: Secondary | ICD-10-CM

## 2015-06-09 DIAGNOSIS — R0789 Other chest pain: Secondary | ICD-10-CM | POA: Diagnosis not present

## 2015-06-09 DIAGNOSIS — I10 Essential (primary) hypertension: Secondary | ICD-10-CM

## 2015-06-09 MED ORDER — DICLOFENAC POTASSIUM 50 MG PO TABS: 50.0000 mg | ORAL_TABLET | Freq: Three times a day (TID) | ORAL | Status: DC

## 2015-06-09 NOTE — Discharge Instructions (Signed)
Cough, Adult Your cough is primarily due to drainage from your sinuses. To minimize or stop this drainage he may take Allegra, Zyrtec or Claritin. If you need something stronger he may take Chlor-Trimeton 2 or 4 mg every 4 hours. This may cause drowsiness. In addition, you may also take Delsym or Robitussin-DM.  Coughing is a reflex that clears your throat and your airways. Coughing helps to heal and protect your lungs. It is normal to cough occasionally, but a cough that happens with other symptoms or lasts a long time may be a sign of a condition that needs treatment. A cough may last only 2-3 weeks (acute), or it may last longer than 8 weeks (chronic). CAUSES Coughing is commonly caused by:  Breathing in substances that irritate your lungs.  A viral or bacterial respiratory infection.  Allergies.  Asthma.  Postnasal drip.  Smoking.  Acid backing up from the stomach into the esophagus (gastroesophageal reflux).  Certain medicines.  Chronic lung problems, including COPD (or rarely, lung cancer).  Other medical conditions such as heart failure. HOME CARE INSTRUCTIONS  Pay attention to any changes in your symptoms. Take these actions to help with your discomfort:  Take medicines only as told by your health care provider.  If you were prescribed an antibiotic medicine, take it as told by your health care provider. Do not stop taking the antibiotic even if you start to feel better.  Talk with your health care provider before you take a cough suppressant medicine.  Drink enough fluid to keep your urine clear or pale yellow.  If the air is dry, use a cold steam vaporizer or humidifier in your bedroom or your home to help loosen secretions.  Avoid anything that causes you to cough at work or at home.  If your cough is worse at night, try sleeping in a semi-upright position.  Avoid cigarette smoke. If you smoke, quit smoking. If you need help quitting, ask your health care  provider.  Avoid caffeine.  Avoid alcohol.  Rest as needed. SEEK MEDICAL CARE IF:   You have new symptoms.  You cough up pus.  Your cough does not get better after 2-3 weeks, or your cough gets worse.  You cannot control your cough with suppressant medicines and you are losing sleep.  You develop pain that is getting worse or pain that is not controlled with pain medicines.  You have a fever.  You have unexplained weight loss.  You have night sweats. SEEK IMMEDIATE MEDICAL CARE IF:  You cough up blood.  You have difficulty breathing.  Your heartbeat is very fast.   This information is not intended to replace advice given to you by your health care provider. Make sure you discuss any questions you have with your health care provider.   Document Released: 07/09/2010 Document Revised: 10/01/2014 Document Reviewed: 03/19/2014 Elsevier Interactive Patient Education 2016 Elsevier Inc[employer]  Chest Wall Pain Ice and Cataflam for chest wall pain Chest wall pain is pain in or around the bones and muscles of your chest. Sometimes, an injury causes this pain. Sometimes, the cause may not be known. This pain may take several weeks or longer to get better. HOME CARE Pay attention to any changes in your symptoms. Take these actions to help with your pain:  Rest as told by your doctor.  Avoid activities that cause pain. Try not to use your chest, belly (abdominal), or side muscles to lift heavy things.  If directed, apply ice to the  painful area:  Put ice in a plastic bag.  Place a towel between your skin and the bag.  Leave the ice on for 20 minutes, 2-3 times per day.  Take over-the-counter and prescription medicines only as told by your doctor.  Do not use tobacco products, including cigarettes, chewing tobacco, and e-cigarettes. If you need help quitting, ask your doctor.  Keep all follow-up visits as told by your doctor. This is important. GET HELP IF:  You have a  fever.  Your chest pain gets worse.  You have new symptoms. GET HELP RIGHT AWAY IF:  You feel sick to your stomach (nauseous) or you throw up (vomit).  You feel sweaty or light-headed.  You have a cough with phlegm (sputum) or you cough up blood.  You are short of breath.   This information is not intended to replace advice given to you by your health care provider. Make sure you discuss any questions you have with your health care provider.   Document Released: 06/29/2007 Document Revised: 10/01/2014 Document Reviewed: 04/07/2014 Elsevier Interactive Patient Education 2016 [employer].  Hypertension Hypertension, commonly called high blood pressure, is when the force of blood pumping through your arteries is too strong. Your arteries are the blood vessels that carry blood from your heart throughout your body. A blood pressure reading consists of a higher number over a lower number, such as 110/72. The higher number (systolic) is the pressure inside your arteries when your heart pumps. The lower number (diastolic) is the pressure inside your arteries when your heart relaxes. Ideally you want your blood pressure below 120/80. Hypertension forces your heart to work harder to pump blood. Your arteries may become narrow or stiff. Having untreated or uncontrolled hypertension can cause heart attack, stroke, kidney disease, and other problems. RISK FACTORS Some risk factors for high blood pressure are controllable. Others are not.  Risk factors you cannot control include:   Race. You may be at higher risk if you are African American.  Age. Risk increases with age.  Gender. Men are at higher risk than women before age 23 years. After age 25, women are at higher risk than men. Risk factors you can control include:  Not getting enough exercise or physical activity.  Being overweight.  Getting too much fat, sugar, calories, or salt in your diet.  Drinking too much alcohol. SIGNS AND  SYMPTOMS Hypertension does not usually cause signs or symptoms. Extremely high blood pressure (hypertensive crisis) may cause headache, anxiety, shortness of breath, and nosebleed. DIAGNOSIS To check if you have hypertension, your health care provider will measure your blood pressure while you are seated, with your arm held at the level of your heart. It should be measured at least twice using the same arm. Certain conditions can cause a difference in blood pressure between your right and left arms. A blood pressure reading that is higher than normal on one occasion does not mean that you need treatment. If it is not clear whether you have high blood pressure, you may be asked to return on a different day to have your blood pressure checked again. Or, you may be asked to monitor your blood pressure at home for 1 or more weeks. TREATMENT Treating high blood pressure includes making lifestyle changes and possibly taking medicine. Living a healthy lifestyle can help lower high blood pressure. You may need to change some of your habits. Lifestyle changes may include:  Following the DASH diet. This diet is high in fruits,  vegetables, and whole grains. It is low in salt, red meat, and added sugars.  Keep your sodium intake below 2,300 mg per day.  Getting at least 30-45 minutes of aerobic exercise at least 4 times per week.  Losing weight if necessary.  Not smoking.  Limiting alcoholic beverages.  Learning ways to reduce stress. Your health care provider may prescribe medicine if lifestyle changes are not enough to get your blood pressure under control, and if one of the following is true:  You are 36-67 years of age and your systolic blood pressure is above 140.  You are 39 years of age or older, and your systolic blood pressure is above 150.  Your diastolic blood pressure is above 90.  You have diabetes, and your systolic blood pressure is over [id number] or your diastolic blood pressure is over  90.  You have kidney disease and your blood pressure is above 140/90.  You have heart disease and your blood pressure is above 140/90. Your personal target blood pressure may vary depending on your medical conditions, your age, and other factors. HOME CARE INSTRUCTIONS  Have your blood pressure rechecked as directed by your health care provider.   Take medicines only as directed by your health care provider. Follow the directions carefully. Blood pressure medicines must be taken as prescribed. The medicine does not work as well when you skip doses. Skipping doses also puts you at risk for problems.  Do not smoke.   Monitor your blood pressure at home as directed by your health care provider. SEEK MEDICAL CARE IF:   You think you are having a reaction to medicines taken.  You have recurrent headaches or feel dizzy.  You have swelling in your ankles.  You have trouble with your vision. SEEK IMMEDIATE MEDICAL CARE IF:  You develop a severe headache or confusion.  You have unusual weakness, numbness, or feel faint.  You have severe chest or abdominal pain.  You vomit repeatedly.  You have trouble breathing. MAKE SURE YOU:   Understand these instructions.  Will watch your condition.  Will get help right away if you are not doing well or get worse.   This information is not intended to replace advice given to you by your health care provider. Make sure you discuss any questions you have with your health care provider.   Document Released: 01/10/2005 Document Revised: 05/27/2014 Document Reviewed: 11/02/2012 Elsevier Interactive Patient Education 2016 [place] Your High Blood Pressure Blood pressure is a measurement of how forceful your blood is pressing against the walls of the arteries. Arteries are muscular tubes within the circulatory system. Blood pressure does not stay the same. Blood pressure rises when you are active, excited, or nervous; and it  lowers during sleep and relaxation. If the numbers measuring your blood pressure stay above normal most of the time, you are at risk for health problems. High blood pressure (hypertension) is a long-term (chronic) condition in which blood pressure is elevated. A blood pressure reading is recorded as two numbers, such as 120 over 80 (or 120/80). The first, higher number is called the systolic pressure. It is a measure of the pressure in your arteries as the heart beats. The second, lower number is called the diastolic pressure. It is a measure of the pressure in your arteries as the heart relaxes between beats.  Keeping your blood pressure in a normal range is important to your overall health and prevention of health problems, such as heart  disease and stroke. When your blood pressure is uncontrolled, your heart has to work harder than normal. High blood pressure is a very common condition in adults because blood pressure tends to rise with age. Men and women are equally likely to have hypertension but at different times in life. Before age 83, men are more likely to have hypertension. After 46 years of age, women are more likely to have it. Hypertension is especially common in African Americans. This condition often has no signs or symptoms. The cause of the condition is usually not known. Your caregiver can help you come up with a plan to keep your blood pressure in a normal, healthy range. BLOOD PRESSURE STAGES Blood pressure is classified into four stages: normal, prehypertension, stage 1, and stage 2. Your blood pressure reading will be used to determine what type of treatment, if any, is necessary. Appropriate treatment options are tied to these four stages:  Normal  Systolic pressure (mm Hg): below 120.  Diastolic pressure (mm Hg): below 80. Prehypertension  Systolic pressure (mm Hg): 120 to 139.  Diastolic pressure (mm Hg): 80 to 89. Stage1  Systolic pressure (mm Hg): 140 to  159.  Diastolic pressure (mm Hg): 90 to 99. Stage2  Systolic pressure (mm Hg): 160 or above.  Diastolic pressure (mm Hg): 100 or above. RISKS RELATED TO HIGH BLOOD PRESSURE Managing your blood pressure is an important responsibility. Uncontrolled high blood pressure can lead to:  A heart attack.  A stroke.  A weakened blood vessel (aneurysm).  Heart failure.  Kidney damage.  Eye damage.  Metabolic syndrome.  Memory and concentration problems. HOW TO MANAGE YOUR BLOOD PRESSURE Blood pressure can be managed effectively with lifestyle changes and medicines (if needed). Your caregiver will help you come up with a plan to bring your blood pressure within a normal range. Your plan should include the following: Education  Read all information provided by your caregivers about how to control blood pressure.  Educate yourself on the latest guidelines and treatment recommendations. New research is always being done to further define the risks and treatments for high blood pressure. Lifestylechanges  Control your weight.  Avoid smoking.  Stay physically active.  Reduce the amount of salt in your diet.  Reduce stress.  Control any chronic conditions, such as high cholesterol or diabetes.  Reduce your alcohol intake. Medicines  Several medicines (antihypertensive medicines) are available, if needed, to bring blood pressure within a normal range. Communication  Review all the medicines you take with your caregiver because there may be side effects or interactions.  Talk with your caregiver about your diet, exercise habits, and other lifestyle factors that may be contributing to high blood pressure.  See your caregiver regularly. Your caregiver can help you create and adjust your plan for managing high blood pressure. RECOMMENDATIONS FOR TREATMENT AND FOLLOW-UP  The following recommendations are based on current guidelines for managing high blood pressure in nonpregnant  adults. Use these recommendations to identify the proper follow-up period or treatment option based on your blood pressure reading. You can discuss these options with your caregiver.  Systolic pressure of 123456 to [id number] or diastolic pressure of 80 to 89: Follow up with your caregiver as directed.  Systolic pressure of [id number] to 0000000 or diastolic pressure of 90 to 100: Follow up with your caregiver within 2 months.  Systolic pressure above 0000000 or diastolic pressure above [id number]: Follow up with your caregiver within 1 month.  Systolic pressure above [id number] or diastolic  pressure above 110: Consider antihypertensive therapy; follow up with your caregiver within 1 week.  Systolic pressure above [id number] or diastolic pressure above 123456: Begin antihypertensive therapy; follow up with your caregiver within 1 week.   This information is not intended to replace advice given to you by your health care provider. Make sure you discuss any questions you have with your health care provider.   Document Released: 10/05/2011 Document Reviewed: 10/05/2011 Elsevier Interactive Patient Education [employer].

## 2015-06-09 NOTE — ED Notes (Signed)
Patient and child are being seen in the same treatment room, same provider

## 2015-06-09 NOTE — ED Notes (Signed)
Patient has multiple complaints.   Patient complains of cough for 3 days.  Patient has chest and throat soreness with cough.  Patient also reports it feels like something "stuck in there"  Patient is clearing throat often during nurse interview.   Patient wants amlodipine changed.  Patient thinks this is making feet swell.  Patient has been on this medicine for 3-4 months.   Patient also requesting bcp.   Mentioned to patient that these two complaints most likely to be addressed at pcp.  Patient reports calling pcp and unable to see her prior to July-patient did not make an appt.

## 2015-06-09 NOTE — ED Provider Notes (Signed)
CSN: [id number]     Arrival date & time 06/09/15  1301 History   First MD Initiated Contact with Patient 06/09/15 1324     Chief Complaint  Patient presents with  . Chest Pain  . Cough   (Consider location/radiation/quality/duration/timing/severity/associated sxs/prior Treatment) HPI Comments: 45 year old female presents to the urgent care requesting changes in her blood pressure medication as well as requesting birth control medicines. She states that the amlodipine is causing her to have some lower extremity edema.  Patient is also complaining of a cough. The cough started 2 days ago. In the pa 36 hours she has had right upper anterior chest pain associated with a cough. the chest pain occurs primarily with cough and is sharp, occasionally radiates to the right posterior shoulder. Denies fever, chills, shortness of breath, chest heaviness tightness, fullness or pressure.    Past Medical History  Diagnosis Date  . Hypertension   . Anxiety   . Multiparity   . High-risk pregnancy   . Gestational diabetes     diet controlled per patient  . Heart palpitations    Past Surgical History  Procedure Laterality Date  . No past surgeries    . Cesarean section  02/06/2012    Procedure: CESAREAN SECTION;  Surgeon: Osborne Oman, MD;  Location: [facility];  Service: Obstetrics;  Laterality: N/A;  Primary Cesarean Section Delivery Baby Boy @ 0000, Apgars 8/9    Family History  Problem Relation Age of Onset  . Diabetes Mother   . Hypertension Father   . Diabetes Father   . COPD Father   . Asthma Father   . Other Neg Hx    Social History  Substance Use Topics  . Smoking status: Never Smoker   . Smokeless tobacco: Never Used  . Alcohol Use: Yes   OB History    Gravida Para Term Preterm AB TAB SAB Ectopic Multiple Living   8 8 8       8      Review of Systems  Constitutional: Negative for fever, activity change and fatigue.  HENT: Positive for sore throat. Negative for congestion, ear  pain and rhinorrhea.   Eyes: Negative.   Respiratory: Positive for cough. Negative for chest tightness and shortness of breath.   Cardiovascular: Positive for chest pain and leg swelling.  Gastrointestinal: Negative.   Genitourinary: Negative.   Musculoskeletal: Negative for back pain and arthralgias.  Skin: Negative.   Neurological: Negative.   All other systems reviewed and are negative.   Allergies  Dilaudid  Home Medications   Prior to Admission medications   Medication Sig Start Date End Date Taking? Authorizing Provider  Enalapril-Hydrochlorothiazide 5-12.5 MG per tablet Take 1 tablet by mouth daily. 12/16/13  Yes Orlie Dakin, MD  hydrochlorothiazide (HYDRODIURIL) 25 MG tablet Take 1 tablet (25 mg total) by mouth daily. 05/20/13  Yes Calvert Cantor, MD  diclofenac (CATAFLAM) 50 MG tablet Take 1 tablet (50 mg total) by mouth 3 (three) times daily. One tablet TID with food prn pain. 06/09/15   Janne Napoleon, NP  hydrochlorothiazide (HYDRODIURIL) 25 MG tablet Take 1 tablet (25 mg total) by mouth daily. 11/28/13   Pamella Pert, MD  ibuprofen (ADVIL,MOTRIN) 600 MG tablet Take 600 mg by mouth every 6 (six) hours as needed for pain. 02/10/12   Seabron Spates, CNM  ibuprofen (ADVIL,MOTRIN) 600 MG tablet Take 1 tablet (600 mg total) by mouth every 6 (six) hours as needed. 03/12/12   Martha Clan, MD  nebivolol (BYSTOLIC)  5 MG tablet Take 5 mg by mouth daily.    Historical Provider, MD   Meds Ordered and Administered this Visit  Medications - No data to display  BP 120/91 mmHg  Pulse 90  Temp(Src) 98.6 F (37 C) (Oral)  Resp 16  SpO2 100%  LMP 05/15/2015 No data found.   Physical Exam  Constitutional: She is oriented to person, place, and time. She appears well-developed and well-nourished. No distress.  HENT:  Mouth/Throat: No oropharyngeal exudate.  Bilateral TMs are normal. Oropharynx with minor erythema, moderate amount of clear PND. No exudates.  Eyes: Conjunctivae and  EOM are normal.  Neck: Normal range of motion. Neck supple.  Cardiovascular: Normal rate, regular rhythm and normal heart sounds.   Pulmonary/Chest: Effort normal and breath sounds normal. No respiratory distress. She has no wheezes. She has no rales. She exhibits no tenderness.  Lymphadenopathy:    She has no cervical adenopathy.  Neurological: She is alert and oriented to person, place, and time. She exhibits normal muscle tone.  Skin: Skin is warm and dry.  Nursing note and vitals reviewed.   ED Course  Procedures (including critical care time)  Labs Review Labs Reviewed - No data to display  Imaging Review Dg Chest 2 View  06/08/2015  CLINICAL DATA:  45 year old female with chest pain EXAM: CHEST  2 VIEW COMPARISON:  Radiograph dated 03/28/2014 FINDINGS: The heart size and mediastinal contours are within normal limits. Both lungs are clear. The visualized skeletal structures are unremarkable. IMPRESSION: No active cardiopulmonary disease. Electronically Signed   By: Anner Crete M.D.   On: 06/08/2015 21:51     Visual Acuity Review  Right Eye Distance:   Left Eye Distance:   Bilateral Distance:    Right Eye Near:   Left Eye Near:    Bilateral Near:         MDM   1. Cough   2. PND (post-nasal drip)   3. Essential hypertension   4. Right-sided chest wall pain    The chest x-ray result above was obtained when she was in the emergency department last evening for cough and chest pain associated with the cough. She decided that she did not what to wait any longer and left emergency department after her chest x-ray had been completed. Your cough is primarily due to drainage from your sinuses. To minimize or stop this drainage he may take Allegra, Zyrtec or Claritin. If you need something stronger he may take Chlor-Trimeton 2 or 4 mg every 4 hours. This may cause drowsiness. In addition, you may also take Delsym or Robitussin-DM. Ice and Cataflam for chest wall  pain Follow-up PCP as soon as possible in regards to birth control pills and medication changes for hypertension.   Janne Napoleon, NP 06/09/15 1358

## 2015-06-11 DIAGNOSIS — Z719 Counseling, unspecified: Secondary | ICD-10-CM

## 2015-06-11 DIAGNOSIS — Z658 Other specified problems related to psychosocial circumstances: Secondary | ICD-10-CM

## 2015-06-15 ENCOUNTER — Encounter: Payer: Self-pay | Admitting: Internal Medicine

## 2015-06-15 ENCOUNTER — Ambulatory Visit: Payer: Medicaid Other | Attending: Internal Medicine | Admitting: Internal Medicine

## 2015-06-15 ENCOUNTER — Telehealth: Payer: Self-pay | Admitting: Internal Medicine

## 2015-06-15 VITALS — BP 150/104 | HR 87 | Temp 98.1°F | Resp 16 | Ht 66.5 in | Wt 239.4 lb

## 2015-06-15 DIAGNOSIS — I10 Essential (primary) hypertension: Secondary | ICD-10-CM

## 2015-06-15 DIAGNOSIS — E785 Hyperlipidemia, unspecified: Secondary | ICD-10-CM

## 2015-06-15 LAB — BASIC METABOLIC PANEL WITH GFR
BUN: 18 mg/dL (ref 7–25)
CALCIUM: 8.7 mg/dL (ref 8.6–10.2)
CO2: 27 mmol/L (ref 20–31)
CREATININE: 1.26 mg/dL — AB (ref 0.50–1.10)
Chloride: 101 mmol/L (ref 98–110)
GFR, EST AFRICAN AMERICAN: 59 mL/min — AB (ref >=60)
GFR, Est Non African American: 52 mL/min — ABNORMAL LOW (ref >=60)
GLUCOSE: 98 mg/dL (ref 65–99)
POTASSIUM: 3.7 mmol/L (ref 3.5–5.3)
Sodium: 138 mmol/L (ref 135–146)

## 2015-06-15 LAB — TSH: TSH: 2.78 mIU/L

## 2015-06-15 MED ORDER — METOPROLOL TARTRATE 50 MG PO TABS: 50.0000 mg | ORAL_TABLET | Freq: Two times a day (BID) | ORAL | Status: DC

## 2015-06-15 MED FILL — METOPROLOL TARTRATE 50 MG T: 50 | 30 days supply | Qty: 60 | Fill #0

## 2015-06-15 NOTE — Progress Notes (Signed)
Pt here for HFU for HTN. 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 Pt requests something for birth control and also request something different for her BP other than amlodipine. Pt states she has not taken her amlodipine since Thursday.

## 2015-06-15 NOTE — Progress Notes (Signed)
Tina Richardson, is a 45 y.o. female  [id number]  [id number]  DOB - 09-Mar-1970  CC:  Chief Complaint  Patient presents with  . Hospitalization Follow-up       HPI: Tina Richardson is a 44 y.o. female here today to establish medical care, new to [facility] from [facility]. Pt was in the ED 5/16 requesting OCP and new htn bp. Per pt, she has been on Norvasc 10mg  for several months, but it causes significant lower extremity swelling. Last time she took was last Thursday when she ran out of it.  She forgot to take her HCTZ 25 this am.  Drinks etoh rarely.  Denies smoking, has 8 children (youngest is 62years old now), thus wants OCP. Currently sexually active.  She is not certain if she is pregnant currently.  Per pt, watching her diet more, trying to do low salt.  +sedentary lifestyle.  Patient has No headache, No chest pain, No abdominal pain - No Nausea, No new weakness tingling or numbness, No Cough - SOB.  No visual changes.  Allergies  Allergen Reactions  . Dilaudid [Hydromorphone Hcl] Other (See Comments)    Could not stop shaking  . Norvasc [Amlodipine Besylate] Swelling    Of legs   Past Medical History  Diagnosis Date  . Hypertension   . Anxiety   . Multiparity   . High-risk pregnancy   . Gestational diabetes     diet controlled per patient  . Heart palpitations    Current Outpatient Prescriptions on File Prior to Visit  Medication Sig Dispense Refill  . hydrochlorothiazide (HYDRODIURIL) 25 MG tablet Take 1 tablet (25 mg total) by mouth daily. 30 tablet 2  . diclofenac (CATAFLAM) 50 MG tablet Take 1 tablet (50 mg total) by mouth 3 (three) times daily. One tablet TID with food prn pain. (Patient not taking: Reported on 06/15/2015) 21 tablet 0  . Enalapril-Hydrochlorothiazide 5-12.5 MG per tablet Take 1 tablet by mouth daily. (Patient not taking: Reported on 06/15/2015) 30 tablet 0  . ibuprofen (ADVIL,MOTRIN) 600 MG tablet Take 600 mg by mouth every 6 (six)  hours as needed for pain. Reported on 06/15/2015    . ibuprofen (ADVIL,MOTRIN) 600 MG tablet Take 1 tablet (600 mg total) by mouth every 6 (six) hours as needed. (Patient not taking: Reported on 06/15/2015) 30 tablet 1  . [DISCONTINUED] lisinopril (PRINIVIL,ZESTRIL) 20 MG tablet Take 1 tablet (20 mg total) by mouth once. 30 tablet 2  . [DISCONTINUED] lisinopril-hydrochlorothiazide (ZESTORETIC) 10-12.5 MG per tablet Take 1 tablet by mouth daily. 30 tablet 3   No current facility-administered medications on file prior to visit.   Family History  Problem Relation Age of Onset  . Diabetes Mother   . Hypertension Father   . Diabetes Father   . COPD Father   . Asthma Father   . Other Neg Hx    Social History   Social History  . Marital Status: Single    Spouse Name: N/A  . Number of Children: N/A  . Years of Education: N/A   Occupational History  . Not on file.   Social History Main Topics  . Smoking status: Never Smoker   . Smokeless tobacco: Never Used  . Alcohol Use: Yes     Comment: occasionally  . Drug Use: No  . Sexual Activity: Yes    Birth Control/ Protection: None   Other Topics Concern  . Not on file   Social History Narrative    Review of Systems:  Constitutional: Negative for fever, chills, diaphoresis, activity change, appetite change and fatigue. HENT: Negative for ear pain, nosebleeds, congestion, facial swelling, rhinorrhea, neck pain, neck stiffness and ear discharge.  Eyes: Negative for pain, discharge, redness, itching and visual disturbance. Respiratory: Negative for cough, choking, chest tightness, shortness of breath, wheezing and stridor.  Cardiovascular: Negative for chest pain, palpitations and leg swelling. Gastrointestinal: Negative for abdominal distention. Genitourinary: Negative for dysuria, urgency, frequency, hematuria, flank pain, decreased urine volume, difficulty urinating and dyspareunia.  Musculoskeletal: Negative for back pain, joint  swelling, arthralgia and gait problem. Neurological: Negative for dizziness, tremors, seizures, syncope, facial asymmetry, speech difficulty, weakness, light-headedness, numbness and headaches.  Hematological: Negative for adenopathy. Does not bruise/bleed easily. Psychiatric/Behavioral: Negative for hallucinations, behavioral problems, confusion, dysphoric mood, decreased concentration and agitation.    Objective:   Filed Vitals:   06/15/15 0918 06/15/15 0936  BP: 157/102 150/104  Pulse: 87   Temp: 98.1 F (36.7 C)   Resp: 16     Filed Weights   06/15/15 0918  Weight: 239 lb 6.4 oz (108.591 kg)    BP Readings from Last 3 Encounters:  06/15/15 150/104  06/09/15 120/91  06/08/15 158/95    Physical Exam: Constitutional: Patient appears well-developed and well-nourished. No distress. AAOx3, obese, pleasant HENT: Normocephalic, atraumatic, External right and left ear normal. Oropharynx is clear and moist.  Eyes: Conjunctivae and EOM are normal. PERRL, no scleral icterus. Neck: Normal ROM. Neck supple. No JVD. No tracheal deviation. No thyromegaly. CVS: RRR, S1/S2 +, no murmurs, no gallops, no carotid bruit.  Pulmonary: Effort and breath sounds normal, no stridor, rhonchi, wheezes, rales.  Abdominal: Soft. BS +, no distension, tenderness, rebound or guarding.  Musculoskeletal: Normal range of motion. No edema and no tenderness.  LE: bilat/ no c/c/e, pulses 2+ bilateral. Lymphadenopathy: No lymphadenopathy noted, cervical Neuro: Alert. Normal reflexes, muscle tone coordination. No cranial nerve deficit grossly. Skin: Skin is warm and dry. No rash noted. Not diaphoretic. No erythema. No pallor. Psychiatric: Normal mood and affect. Behavior, judgment, thought content normal.  Lab Results  Component Value Date   WBC 8.5 06/08/2015   HGB 11.9* 06/08/2015   HCT 36.8 06/08/2015   MCV 82.7 06/08/2015   PLT 328 06/08/2015   Lab Results  Component Value Date   CREATININE 1.55*  06/08/2015   BUN 23* 06/08/2015   NA 137 06/08/2015   K 2.9* 06/08/2015   CL 102 06/08/2015   CO2 27 06/08/2015    Lab Results  Component Value Date   HGBA1C 5.6 12/25/2011   Lipid Panel  No results found for: CHOL, TRIG, HDL, CHOLHDL, VLDL, LDLCALC     Depression screen [facility] 2/9 06/15/2015  Decreased Interest 0  Down, Depressed, Hopeless 0  PHQ - 2 Score 0    Assessment and plan:   1.  essential HTN, uncontrolled - no ha/visual changes,/no signs of htn emergency currently. - Uncontrolled, off norvasc since last week, per pt Norvasc causes bad le swelling, allergy added to list - pt has been on acei in past w/o issues. - BASIC METABOLIC PANEL WITH GFR - Hemoglobin A1c - Microalbumin/Creatinine Ratio, Urine - trial hctz 25 qday and metoprolol 50bid  - f/u in couple of wks on bp - LOW SALT/dash diet discussed at length today.  2. Morbid obesity, unspecified obesity type ([place]) - TSH - Hemoglobin A1c - Pregnancy, urine  3. Dyslipidemia Per pt recent labs at [facility] health, was instructed to modify lifestyle  4. OCP request -  dw pt several options, she wants to do ocp - will chk urine pregnancy today, if neg will put in rx for ocp.  5. Morbid obesity bmi 38 dw pt at length re: diet /excerise. Will chk a1c, if prediabetes, will start metformin 500bid.  Return in about 2 weeks (around 06/29/2015) for papsmear. Lendell Caprice chk.  The patient was given clear instructions to go to ER or return to medical center if symptoms don't improve, worsen or new problems develop. The patient verbalized understanding. The patient was told to call to get lab results if they haven't heard anything in the next week.      Maren Reamer, MD, [place], [place]   06/15/2015, 9:59 AM

## 2015-06-15 NOTE — Telephone Encounter (Signed)
Called patient to notify her that Dr.Langeland is waiting for her urine pregnancy results before she prescribes her birth control. Patient did not answer. Left a voicemail stating for patient to return call at [phone].

## 2015-06-15 NOTE — Patient Instructions (Signed)
DASH Eating Plan DASH stands for "Dietary Approaches to Stop Hypertension." The DASH eating plan is a healthy eating plan that has been shown to reduce high blood pressure (hypertension). Additional health benefits may include reducing the risk of type 2 diabetes mellitus, heart disease, and stroke. The DASH eating plan may also help with weight loss. WHAT DO I NEED TO KNOW ABOUT THE DASH EATING PLAN? For the DASH eating plan, you will follow these general guidelines:  Choose foods with a percent daily value for sodium of less than 5% (as listed on the food label).  Use salt-free seasonings or herbs instead of table salt or sea salt.  Check with your health care provider or pharmacist before using salt substitutes.  Eat lower-sodium products, often labeled as "lower sodium" or "no salt added."  Eat fresh foods.  Eat more vegetables, fruits, and low-fat dairy products.  Choose whole grains. Look for the word "whole" as the first word in the ingredient list.  Choose fish and skinless chicken or turkey more often than red meat. Limit fish, poultry, and meat to 6 oz (170 g) each day.  Limit sweets, desserts, sugars, and sugary drinks.  Choose heart-healthy fats.  Limit cheese to 1 oz (28 g) per day.  Eat more home-cooked food and less restaurant, buffet, and fast food.  Limit fried foods.  Cook foods using methods other than frying.  Limit canned vegetables. If you do use them, rinse them well to decrease the sodium.  When eating at a restaurant, ask that your food be prepared with less salt, or no salt if possible. WHAT FOODS CAN I EAT? Seek help from a dietitian for individual calorie needs. Grains Whole grain or whole wheat bread. Brown rice. Whole grain or whole wheat pasta. Quinoa, bulgur, and whole grain cereals. Low-sodium cereals. Corn or whole wheat flour tortillas. Whole grain cornbread. Whole grain crackers. Low-sodium crackers. Vegetables Fresh or frozen vegetables  (raw, steamed, roasted, or grilled). Low-sodium or reduced-sodium tomato and vegetable juices. Low-sodium or reduced-sodium tomato sauce and paste. Low-sodium or reduced-sodium canned vegetables.  Fruits All fresh, canned (in natural juice), or frozen fruits. Meat and Other Protein Products Ground beef (85% or leaner), grass-fed beef, or beef trimmed of fat. Skinless chicken or turkey. Ground chicken or turkey. Pork trimmed of fat. All fish and seafood. Eggs. Dried beans, peas, or lentils. Unsalted nuts and seeds. Unsalted canned beans. Dairy Low-fat dairy products, such as skim or 1% milk, 2% or reduced-fat cheeses, low-fat ricotta or cottage cheese, or plain low-fat yogurt. Low-sodium or reduced-sodium cheeses. Fats and Oils Tub margarines without trans fats. Light or reduced-fat mayonnaise and salad dressings (reduced sodium). Avocado. Safflower, olive, or canola oils. Natural peanut or almond butter. Other Unsalted popcorn and pretzels. The items listed above may not be a complete list of recommended foods or beverages. Contact your dietitian for more options. WHAT FOODS ARE NOT RECOMMENDED? Grains White bread. White pasta. White rice. Refined cornbread. Bagels and croissants. Crackers that contain trans fat. Vegetables Creamed or fried vegetables. Vegetables in a cheese sauce. Regular canned vegetables. Regular canned tomato sauce and paste. Regular tomato and vegetable juices. Fruits Dried fruits. Canned fruit in light or heavy syrup. Fruit juice. Meat and Other Protein Products Fatty cuts of meat. Ribs, chicken wings, bacon, sausage, bologna, salami, chitterlings, fatback, hot dogs, bratwurst, and packaged luncheon meats. Salted nuts and seeds. Canned beans with salt. Dairy Whole or 2% milk, cream, half-and-half, and cream cheese. Whole-fat or sweetened yogurt. Full-fat   cheeses or blue cheese. Nondairy creamers and whipped toppings. Processed cheese, cheese spreads, or cheese  curds. Condiments Onion and garlic salt, seasoned salt, table salt, and sea salt. Canned and packaged gravies. Worcestershire sauce. Tartar sauce. Barbecue sauce. Teriyaki sauce. Soy sauce, including reduced sodium. Steak sauce. Fish sauce. Oyster sauce. Cocktail sauce. Horseradish. Ketchup and mustard. Meat flavorings and tenderizers. Bouillon cubes. Hot sauce. Tabasco sauce. Marinades. Taco seasonings. Relishes. Fats and Oils Butter, stick margarine, lard, shortening, ghee, and bacon fat. Coconut, palm kernel, or palm oils. Regular salad dressings. Other Pickles and olives. Salted popcorn and pretzels. The items listed above may not be a complete list of foods and beverages to avoid. Contact your dietitian for more information. WHERE CAN I FIND MORE INFORMATION? National Heart, Lung, and Blood Institute: travelstabloid.com   This information is not intended to replace advice given to you by your health care provider. Make sure you discuss any questions you have with your health care provider.   Document Released: 12/30/2010 Document Revised: 01/31/2014 Document Reviewed: 11/14/2012 Elsevier Interactive Patient Education 2016 Elsevier Inc. - Low-Sodium Eating Plan Sodium raises blood pressure and causes water to be held in the body. Getting less sodium from food will help lower your blood pressure, reduce any swelling, and protect your heart, liver, and kidneys. We get sodium by adding salt (sodium chloride) to food. Most of our sodium comes from canned, boxed, and frozen foods. Restaurant foods, fast foods, and pizza are also very high in sodium. Even if you take medicine to lower your blood pressure or to reduce fluid in your body, getting less sodium from your food is important. WHAT IS MY PLAN? Most people should limit their sodium intake to 2,300 mg a day. Your health care provider recommends that you limit your sodium intake to __________ a day.  WHAT DO I  NEED TO KNOW ABOUT THIS EATING PLAN? For the low-sodium eating plan, you will follow these general guidelines:  Choose foods with a % Daily Value for sodium of less than 5% (as listed on the food label).   Use salt-free seasonings or herbs instead of table salt or sea salt.   Check with your health care provider or pharmacist before using salt substitutes.   Eat fresh foods.  Eat more vegetables and fruits.  Limit canned vegetables. If you do use them, rinse them well to decrease the sodium.   Limit cheese to 1 oz (28 g) per day.   Eat lower-sodium products, often labeled as "lower sodium" or "no salt added."  Avoid foods that contain monosodium glutamate (MSG). MSG is sometimes added to [place] food and some canned foods.  Check food labels (Nutrition Facts labels) on foods to learn how much sodium is in one serving.  Eat more home-cooked food and less restaurant, buffet, and fast food.  When eating at a restaurant, ask that your food be prepared with less salt, or no salt if possible.  HOW DO I READ FOOD LABELS FOR SODIUM INFORMATION? The Nutrition Facts label lists the amount of sodium in one serving of the food. If you eat more than one serving, you must multiply the listed amount of sodium by the number of servings. Food labels may also identify foods as:  Sodium free--Less than 5 mg in a serving.  Very low sodium--35 mg or less in a serving.  Low sodium--140 mg or less in a serving.  Light in sodium--50% less sodium in a serving. For example, if a food that usually  has 300 mg of sodium is changed to become light in sodium, it will have 150 mg of sodium.  Reduced sodium--25% less sodium in a serving. For example, if a food that usually has 400 mg of sodium is changed to reduced sodium, it will have 300 mg of sodium. WHAT FOODS CAN I EAT? Grains Low-sodium cereals, including oats, puffed wheat and rice, and shredded wheat cereals. Low-sodium crackers. Unsalted  rice and pasta. Lower-sodium bread.  Vegetables Frozen or fresh vegetables. Low-sodium or reduced-sodium canned vegetables. Low-sodium or reduced-sodium tomato sauce and paste. Low-sodium or reduced-sodium tomato and vegetable juices.  Fruits Fresh, frozen, and canned fruit. Fruit juice.  Meat and Other Protein Products Low-sodium canned tuna and salmon. Fresh or frozen meat, poultry, seafood, and fish. Lamb. Unsalted nuts. Dried beans, peas, and lentils without added salt. Unsalted canned beans. Homemade soups without salt. Eggs.  Dairy Milk. Soy milk. Ricotta cheese. Low-sodium or reduced-sodium cheeses. Yogurt.  Condiments Fresh and dried herbs and spices. Salt-free seasonings. Onion and garlic powders. Low-sodium varieties of mustard and ketchup. Fresh or refrigerated horseradish. Lemon juice.  Fats and Oils Reduced-sodium salad dressings. Unsalted butter.  Other Unsalted popcorn and pretzels.  The items listed above may not be a complete list of recommended foods or beverages. Contact your dietitian for more options. WHAT FOODS ARE NOT RECOMMENDED? Grains Instant hot cereals. Bread stuffing, pancake, and biscuit mixes. Croutons. Seasoned rice or pasta mixes. Noodle soup cups. Boxed or frozen macaroni and cheese. Self-rising flour. Regular salted crackers. Vegetables Regular canned vegetables. Regular canned tomato sauce and paste. Regular tomato and vegetable juices. Frozen vegetables in sauces. Salted [place] fries. Olives. Angie Fava. Relishes. Sauerkraut. Salsa. Meat and Other Protein Products Salted, canned, smoked, spiced, or pickled meats, seafood, or fish. Bacon, ham, sausage, hot dogs, corned beef, chipped beef, and packaged luncheon meats. Salt pork. Jerky. Pickled herring. Anchovies, regular canned tuna, and sardines. Salted nuts. Dairy Processed cheese and cheese spreads. Cheese curds. Blue cheese and cottage cheese. Buttermilk.  Condiments Onion and garlic salt,  seasoned salt, table salt, and sea salt. Canned and packaged gravies. Worcestershire sauce. Tartar sauce. Barbecue sauce. Teriyaki sauce. Soy sauce, including reduced sodium. Steak sauce. Fish sauce. Oyster sauce. Cocktail sauce. Horseradish that you find on the shelf. Regular ketchup and mustard. Meat flavorings and tenderizers. Bouillon cubes. Hot sauce. Tabasco sauce. Marinades. Taco seasonings. Relishes. Fats and Oils Regular salad dressings. Salted butter. Margarine. Ghee. Bacon fat.  Other Potato and tortilla chips. Corn chips and puffs. Salted popcorn and pretzels. Canned or dried soups. Pizza. Frozen entrees and pot pies.  The items listed above may not be a complete list of foods and beverages to avoid. Contact your dietitian for more information.   This information is not intended to replace advice given to you by your health care provider. Make sure you discuss any questions you have with your health care provider.   Document Released: 07/02/2001 Document Revised: 01/31/2014 Document Reviewed: 11/14/2012 Elsevier Interactive Patient Education 2016 [place] to [employer] Exercising can help you to lose weight. In order to lose weight through exercise, you need to do vigorous-intensity exercise. You can tell that you are exercising with vigorous intensity if you are breathing very hard and fast and cannot hold a conversation while exercising. Moderate-intensity exercise helps to maintain your current weight. You can tell that you are exercising at a moderate level if you have a higher heart rate and faster breathing, but you are still able  to hold a conversation. HOW OFTEN SHOULD I EXERCISE? Choose an activity that you enjoy and set realistic goals. Your health care provider can help you to make an activity plan that works for you. Exercise regularly as directed by your health care provider. This may include:  Doing resistance training twice each week, such  as:  Push-ups.  Sit-ups.  Lifting weights.  Using resistance bands.  Doing a given intensity of exercise for a given amount of time. Choose from these options:  150 minutes of moderate-intensity exercise every week.  75 minutes of vigorous-intensity exercise every week.  A mix of moderate-intensity and vigorous-intensity exercise every week. Children, pregnant women, people who are out of shape, people who are overweight, and older adults may need to consult a health care provider for individual recommendations. If you have any sort of medical condition, be sure to consult your health care provider before starting a new exercise program. WHAT ARE SOME ACTIVITIES THAT CAN HELP ME TO LOSE WEIGHT?   Walking at a rate of at least 4.5 miles an hour.  Jogging or running at a rate of 5 miles per hour.  Biking at a rate of at least 10 miles per hour.  Lap swimming.  Roller-skating or in-line skating.  Cross-country skiing.  Vigorous competitive sports, such as football, basketball, and soccer.  Jumping rope.  Aerobic dancing. HOW CAN I BE MORE ACTIVE IN MY DAY-TO-DAY ACTIVITIES?  Use the stairs instead of the elevator.  Take a walk during your lunch break.  If you drive, park your car farther away from work or school.  If you take public transportation, get off one stop early and walk the rest of the way.  Make all of your phone calls while standing up and walking around.  Get up, stretch, and walk around every 30 minutes throughout the day. WHAT GUIDELINES SHOULD I FOLLOW WHILE EXERCISING?  Do not exercise so much that you hurt yourself, feel dizzy, or get very short of breath.  Consult your health care provider prior to starting a new exercise program.  Wear comfortable clothes and shoes with good support.  Drink plenty of water while you exercise to prevent dehydration or heat stroke. Body water is lost during exercise and must be replaced.  Work out until you  breathe faster and your heart beats faster.   This information is not intended to replace advice given to you by your health care provider. Make sure you discuss any questions you have with your health care provider.   Document Released: 02/12/2010 Document Revised: 01/31/2014 Document Reviewed: 06/13/2013 Elsevier Interactive Patient Education [employer].

## 2015-06-16 ENCOUNTER — Other Ambulatory Visit: Payer: Self-pay | Admitting: Internal Medicine

## 2015-06-16 LAB — MICROALBUMIN / CREATININE URINE RATIO
CREATININE, URINE: 122 mg/dL (ref 20–320)
Microalb Creat Ratio: 16 mcg/mg creat (ref <30)
Microalb, Ur: 2 mg/dL

## 2015-06-16 LAB — HEMOGLOBIN A1C
Hgb A1c MFr Bld: 6.1 % — ABNORMAL HIGH (ref <5.7)
Mean Plasma Glucose: 128 mg/dL

## 2015-06-16 LAB — PREGNANCY, URINE: PREG TEST UR: NEGATIVE

## 2015-06-16 MED ORDER — NORGESTIM-ETH ESTRAD TRIPHASIC 0.18/0.215/0.25 MG-35 MCG PO TABS: 1.0000 | ORAL_TABLET | Freq: Every day | ORAL | Status: DC

## 2015-06-16 MED ORDER — METFORMIN HCL 500 MG PO TABS: 500.0000 mg | ORAL_TABLET | Freq: Two times a day (BID) | ORAL | Status: DC

## 2015-06-16 MED FILL — metFORMIN HCL 500 MG TABS: 500 | 30 days supply | Qty: 60 | Fill #0

## 2015-06-16 MED FILL — TRI-LINYAH TABLET: 0.18/0.215/ | 28 days supply | Qty: 28 | Fill #0

## 2015-06-17 NOTE — Telephone Encounter (Signed)
Called patient. Pt verified name and date of birth. Pt notified that her prescription for birth control was sent to the pharmacy. Pt stated that she checked MyChart and saw the prescription and has already picked it up.

## 2015-06-18 DIAGNOSIS — Z719 Counseling, unspecified: Secondary | ICD-10-CM

## 2015-06-25 ENCOUNTER — Telehealth: Payer: Self-pay | Admitting: *Deleted

## 2015-06-25 NOTE — Telephone Encounter (Signed)
Medical Assistant left message on patient's home and cell voicemail. Voicemail states to give a call back to Nubia with [facility] at [phone].  

## 2015-06-25 NOTE — Telephone Encounter (Signed)
-----   Message from Maren Reamer, MD sent at 06/16/2015  8:45 AM EDT ----- Please call pt w/ results.  Urine pregnancy negative, I wrote rx for ortho tri cyclen birth control pills.  Also her preDM is worse now w/ higher A1c, watch the carbs!.  Thyroid normal, she has ckd stage 3 by labs going back few years now, likely due to the HTN and borderline DM. Need to take care of htn/dm better b/4 kidney function worsens.. I started her on metfomin 500bid, which will cause some gi discomfort initially, which actually will help her w/ her DM and weightloss as well.  Please ask her to pick up both. Thanks.  Dm edu Aim for 30 minutes of exercise most days. Rethink what you drink. Water is great! Aim for 2-3 Carb Choices per meal (30-45 grams) +/- 1 either way.  Aim for 0-15 Carbs per snack if hungry.  Include protein in moderation with your meals and snacks.  Consider reading food labels for Total Carbohydrate and Fat Grams of foods  Consider checking blood glucose (accuchecks/BG) at alternate times per day.  Continue taking medication as directed. Be mindful about how much sugar you are adding to beverages and other foods. Fruit Punch - find one with no sugar  Measure and decrease portions of carbohydrate foods. Make your plate and don't go back for seconds.

## 2015-06-26 ENCOUNTER — Telehealth: Payer: Self-pay | Admitting: Internal Medicine

## 2015-06-26 NOTE — Telephone Encounter (Signed)
Patient is returning call to nurse...  Please call back

## 2015-06-29 ENCOUNTER — Other Ambulatory Visit: Payer: Medicaid Other | Admitting: Internal Medicine

## 2015-07-02 NOTE — Telephone Encounter (Signed)
Clld pt - LMOVMTC re lab results. 

## 2015-07-06 ENCOUNTER — Other Ambulatory Visit: Payer: Medicaid Other | Admitting: Internal Medicine

## 2015-07-09 DIAGNOSIS — Z719 Counseling, unspecified: Secondary | ICD-10-CM

## 2015-07-24 DIAGNOSIS — Z719 Counseling, unspecified: Secondary | ICD-10-CM

## 2015-07-31 NOTE — Congregational Nurse Program (Signed)
Congregational Nurse Program Note  Date of Encounter: 07/24/2015  Past Medical History: Past Medical History  Diagnosis Date  . Hypertension   . Anxiety   . Multiparity   . High-risk pregnancy   . Gestational diabetes     diet controlled per patient  . Heart palpitations     Encounter Details:     CNP Questionnaire - 07/24/15 0013    Patient Demographics   Is this a new or existing patient? Existing   Patient is considered a/an Not Applicable   Race African-American/Black   Patient Assistance   Location of Patient [place]   Patient's financial/insurance status Low Income;Medicaid   Uninsured Patient No   Patient referred to apply for the following financial assistance Not Applicable   Food insecurities addressed Provided food supplies   Transportation assistance No   Assistance securing medications No   Educational health offerings Cardiac disease;Acute disease;Spiritual care;Navigating the healthcare system;Chronic disease   Encounter Details   Primary purpose of visit Chronic Illness/Condition Visit;Education/Health Concerns;Navigating the Healthcare System;Family/Caregiver Support   Was an Emergency Department visit averted? No   Does patient have a medical provider? Yes   Patient referred to Not Applicable   Was a mental health screening completed? (GAINS tool) No   Does patient have dental issues? No   Does patient have vision issues? No   Does your patient have an abnormal blood pressure today? No   Since previous encounter, have you referred patient for abnormal blood pressure that resulted in a new diagnosis or medication change? No   Does your patient have an abnormal blood glucose today? No   Since previous encounter, have you referred patient for abnormal blood glucose that resulted in a new diagnosis or medication change? No   Was there a life-saving intervention made? No      Client graduated from steps to success today. .  She  made a speech to support the class and note what they had accomplished.Client will followup with me

## 2015-11-25 ENCOUNTER — Encounter (HOSPITAL_COMMUNITY): Payer: Self-pay | Admitting: *Deleted

## 2015-11-25 DIAGNOSIS — Z7984 Long term (current) use of oral hypoglycemic drugs: Secondary | ICD-10-CM | POA: Diagnosis not present

## 2015-11-25 DIAGNOSIS — R51 Headache: Secondary | ICD-10-CM | POA: Diagnosis not present

## 2015-11-25 DIAGNOSIS — I10 Essential (primary) hypertension: Secondary | ICD-10-CM | POA: Diagnosis present

## 2015-11-25 LAB — COMPREHENSIVE METABOLIC PANEL
ALBUMIN: 3.6 g/dL (ref 3.5–5.0)
ALK PHOS: 72 U/L (ref 38–126)
ALT: 19 U/L (ref 14–54)
ANION GAP: 9 (ref 5–15)
AST: 21 U/L (ref 15–41)
BUN: 20 mg/dL (ref 6–20)
CALCIUM: 8.8 mg/dL — AB (ref 8.9–10.3)
CHLORIDE: 109 mmol/L (ref 101–111)
CO2: 22 mmol/L (ref 22–32)
Creatinine, Ser: 1.45 mg/dL — ABNORMAL HIGH (ref 0.44–1.00)
GFR calc Af Amer: 49 mL/min — ABNORMAL LOW (ref >60)
GFR calc non Af Amer: 42 mL/min — ABNORMAL LOW (ref >60)
GLUCOSE: 112 mg/dL — AB (ref 65–99)
Potassium: 3.1 mmol/L — ABNORMAL LOW (ref 3.5–5.1)
SODIUM: 140 mmol/L (ref 135–145)
Total Bilirubin: 0.5 mg/dL (ref 0.3–1.2)
Total Protein: 6.4 g/dL — ABNORMAL LOW (ref 6.5–8.1)

## 2015-11-25 LAB — CBC
HEMATOCRIT: 40.3 % (ref 36.0–46.0)
HEMOGLOBIN: 13.3 g/dL (ref 12.0–15.0)
MCH: 26.9 pg (ref 26.0–34.0)
MCHC: 33 g/dL (ref 30.0–36.0)
MCV: 81.4 fL (ref 78.0–100.0)
Platelets: 307 10*3/uL (ref 150–400)
RBC: 4.95 MIL/uL (ref 3.87–5.11)
RDW: 14.3 % (ref 11.5–15.5)
WBC: 10.4 10*3/uL (ref 4.0–10.5)

## 2015-11-25 LAB — CBG MONITORING, ED: GLUCOSE-CAPILLARY: 107 mg/dL — AB (ref 65–99)

## 2015-11-25 NOTE — ED Triage Notes (Signed)
The pt has high bp and for several days she has been absent minded and confused.  She checked her bp today at a familly members advise and it was very high  C/o a headache also

## 2015-11-26 ENCOUNTER — Emergency Department (HOSPITAL_COMMUNITY): Payer: Medicaid Other

## 2015-11-26 ENCOUNTER — Emergency Department (HOSPITAL_COMMUNITY)
Admission: EM | Admit: 2015-11-26 | Discharge: 2015-11-26 | Disposition: A | Discharge status: Home / Self Care | Payer: Medicaid Other | Attending: Emergency Medicine | Admitting: Emergency Medicine

## 2015-11-26 DIAGNOSIS — I10 Essential (primary) hypertension: Secondary | ICD-10-CM

## 2015-11-26 MED ORDER — LABETALOL HCL 5 MG/ML IV SOLN
20.0000 mg | Freq: Once | INTRAVENOUS | Status: AC
  Administered 2015-11-26: 20 mg via INTRAVENOUS
  Filled 2015-11-26: qty 4

## 2015-11-26 MED ORDER — HYDROCHLOROTHIAZIDE 25 MG PO TABS: 25.0000 mg | ORAL_TABLET | Freq: Every day | ORAL | 0 refills | Status: DC

## 2015-11-26 MED ORDER — HYDROCHLOROTHIAZIDE 25 MG PO TABS
25.0000 mg | ORAL_TABLET | Freq: Every day | ORAL | Status: DC
  Administered 2015-11-26: 25 mg via ORAL
  Filled 2015-11-26: qty 1

## 2015-11-26 MED ORDER — ONDANSETRON HCL 4 MG/2ML IJ SOLN
4.0000 mg | Freq: Once | INTRAMUSCULAR | Status: AC
  Administered 2015-11-26: 4 mg via INTRAVENOUS
  Filled 2015-11-26: qty 2

## 2015-11-26 MED ORDER — POTASSIUM CHLORIDE CRYS ER 20 MEQ PO TBCR
40.0000 meq | EXTENDED_RELEASE_TABLET | Freq: Once | ORAL | Status: AC
  Administered 2015-11-26: 40 meq via ORAL
  Filled 2015-11-26: qty 2

## 2015-11-26 NOTE — ED Notes (Signed)
Patient transported to CT 

## 2015-11-26 NOTE — ED Provider Notes (Signed)
[facility] DEPT Provider Note   CSN: [id number] Arrival date & time: 11/25/15  2140   History   Chief Complaint Chief Complaint  Patient presents with  . Hypertension    HPI Tina Richardson is a 45 y.o. female.  HPI   Patient with PMH of anxiety, heart palpitations, high-risk pregnancy, hypertension, multiparity comes in with concern of some mild confusion and hypertension. She was on Metoprolol but her PCP took her off 1 month ago due to her creatinine, she thinks Cr, being elevated. She was not started on another medication. She has also had headaches. She denies CP, SOB, weakness, confusion, change in vision, abdominal pain, dysuria or hematuria.   Past Medical History:  Diagnosis Date  . Anxiety   . Gestational diabetes    diet controlled per patient  . Heart palpitations   . High-risk pregnancy   . Hypertension   . Multiparity     Patient Active Problem List   Diagnosis Date Noted  . Failure to progress in labor 02/10/2012  . Cesarean delivery delivered 02/10/2012  . S/P cesarean section 02/06/2012  . GBS (group B streptococcus) UTI complicating pregnancy [id number]  . LGA (large for gestational age) fetus affecting mother, antepartum 01/23/2012  . Pregnancy, supervision for, high-risk 01/23/2012  . Polyhydramnios, antepartum 01/03/2012  . Gestational Diabetes 10/10/2011  . AMA (advanced maternal age) multigravida 35+ 08/31/2011  . [place] multipara 08/31/2011  . Blood pressure elevated 08/31/2011  . Obesity complicating pregnancy [id number]  . Fibroids 08/31/2011  . BV (bacterial vaginosis) 08/31/2011    Past Surgical History:  Procedure Laterality Date  . CESAREAN SECTION  02/06/2012   Procedure: CESAREAN SECTION;  Surgeon: Osborne Oman, MD;  Location: [facility];  Service: Obstetrics;  Laterality: N/A;  Primary Cesarean Section Delivery Baby Boy @ 0000, Apgars 8/9   . NO PAST SURGERIES      OB History    Gravida Para Term Preterm AB Living   8 8 8      8    SAB TAB Ectopic Multiple Live Births           2       Home Medications    Prior to Admission medications   Medication Sig Start Date End Date Taking? Authorizing Provider  diclofenac (CATAFLAM) 50 MG tablet Take 1 tablet (50 mg total) by mouth 3 (three) times daily. One tablet TID with food prn pain. Patient not taking: Reported on 11/26/2015 06/09/15   Janne Napoleon, NP  Enalapril-Hydrochlorothiazide 5-12.5 MG per tablet Take 1 tablet by mouth daily. Patient not taking: Reported on 11/26/2015 12/16/13   Orlie Dakin, MD  hydrochlorothiazide (HYDRODIURIL) 25 MG tablet Take 1 tablet (25 mg total) by mouth daily. Patient not taking: Reported on 11/26/2015 05/20/13   Calvert Cantor, MD  hydrochlorothiazide (HYDRODIURIL) 25 MG tablet Take 1 tablet (25 mg total) by mouth daily. 11/26/15   Ashford Clouse Carlota Raspberry, PA-C  ibuprofen (ADVIL,MOTRIN) 600 MG tablet Take 1 tablet (600 mg total) by mouth every 6 (six) hours as needed. Patient not taking: Reported on 11/26/2015 03/12/12   Martha Clan, MD  metFORMIN (GLUCOPHAGE) 500 MG tablet Take 1 tablet (500 mg total) by mouth 2 (two) times daily with a meal. Patient not taking: Reported on 11/26/2015 06/16/15   Maren Reamer, MD  metoprolol (LOPRESSOR) 50 MG tablet Take 1 tablet (50 mg total) by mouth 2 (two) times daily. Patient not taking: Reported on 11/26/2015 06/15/15   Maren Reamer, MD  Norgestimate-Ethinyl Estradiol Triphasic 0.18/0.215/0.25  MG-35 MCG tablet Take 1 tablet by mouth daily. Patient not taking: Reported on 11/26/2015 06/16/15   Maren Reamer, MD    Family History Family History  Problem Relation Age of Onset  . Diabetes Mother   . Hypertension Father   . Diabetes Father   . COPD Father   . Asthma Father   . Other Neg Hx     Social History Social History  Substance Use Topics  . Smoking status: Never Smoker  . Smokeless tobacco: Never Used  . Alcohol use Yes     Comment: occasionally     Allergies   Dilaudid  [hydromorphone hcl] and Norvasc [amlodipine besylate]   Review of Systems Review of Systems  Review of Systems All other systems negative except as documented in the HPI. All pertinent positives and negatives as reviewed in the HPI.   Physical Exam Updated Vital Signs BP (!) 194/112   Pulse 89   Temp 98.7 F (37.1 C) (Oral)   Resp 16   Ht 5\' 10"  (1.778 m)   Wt 109 kg   LMP 11/12/2015   SpO2 97%   BMI 34.48 kg/m   Physical Exam  Constitutional: She appears well-developed and well-nourished. No distress.  HENT:  Head: Normocephalic and atraumatic.  Right Ear: Tympanic membrane and ear canal normal.  Left Ear: Tympanic membrane and ear canal normal.  Nose: Nose normal.  Mouth/Throat: Uvula is midline, oropharynx is clear and moist and mucous membranes are normal.  Eyes: Pupils are equal, round, and reactive to light.  Fundoscopic exam:      The right eye shows no hemorrhage and no papilledema.       The left eye shows no hemorrhage and no papilledema.  Neck: Normal range of motion. Neck supple.  Cardiovascular: Normal rate and regular rhythm.   Pulmonary/Chest: Effort normal.  Abdominal: Soft.  No signs of abdominal distention  Musculoskeletal:  No LE swelling  Neurological: She is alert.  Acting at baseline Cranial nerves grossly intact on exam. Pt alert and oriented x 3 Upper and lower extremity strength is symmetrical and physiologic Normal muscular tone No facial droop Coordination intact, no limb ataxia,No pronator drift  Skin: Skin is warm and dry. No rash noted.  Nursing note and vitals reviewed.   ED Treatments / Results  Labs (all labs ordered are listed, but only abnormal results are displayed) Labs Reviewed  COMPREHENSIVE METABOLIC PANEL - Abnormal; Notable for the following:       Result Value   Potassium 3.1 (*)    Glucose, Bld 112 (*)    Creatinine, Ser 1.45 (*)    Calcium 8.8 (*)    Total Protein 6.4 (*)    GFR calc non Af Amer 42 (*)     GFR calc Af Amer 49 (*)    All other components within normal limits  CBG MONITORING, ED - Abnormal; Notable for the following:    Glucose-Capillary 107 (*)    All other components within normal limits  CBC    EKG  EKG Interpretation None       Radiology Ct Head Wo Contrast  Result Date: 11/26/2015 CLINICAL DATA:  45 y/o F; 2 weeks of headaches with difficulty remembering things. EXAM: CT HEAD WITHOUT CONTRAST TECHNIQUE: Contiguous axial images were obtained from the base of the skull through the vertex without intravenous contrast. COMPARISON:  11/28/2013 CT head. FINDINGS: Brain: No evidence of acute infarction, hemorrhage, hydrocephalus, extra-axial collection or mass lesion/mass effect. Vascular: No  hyperdense vessel or unexpected calcification. Skull: Normal. Negative for fracture or focal lesion. Sinuses/Orbits: No acute finding. Other: None. IMPRESSION: No acute intracranial abnormality. Normal CT of head for age. No significant interval change. Electronically Signed   By: Kristine Garbe M.D.   On: 11/26/2015 01:53    Procedures Procedures (including critical care time)  Medications Ordered in ED Medications  potassium chloride SA (K-DUR,KLOR-CON) CR tablet 40 mEq (not administered)  ondansetron (ZOFRAN) injection 4 mg (4 mg Intravenous Given 11/26/15 0142)  labetalol (NORMODYNE,TRANDATE) injection 20 mg (20 mg Intravenous Given 11/26/15 0142)     Initial Impression / Assessment and Plan / ED Course  I have reviewed the triage vital signs and the nursing notes.  Pertinent labs & imaging results that were available during my care of the patient were reviewed by me and considered in my medical decision making (see chart for details).  Clinical Course   Normal Head CT, stable blood work, no sign of end organ damage, she was given 20 mg IV Labetalol in ED and her BP improved to 150/92. Will give first dose of HCTZ in ED. Pt to follow-up and have her blood pressure  rechecked within the next 1-2 days. Case discussed with DR. Campos. Patient noted to be hypertensive in the emergency department.  No signs of hypertensive urgency.  Discussed with patient the need for close follow-up and management by their primary care physician.      Final Clinical Impressions(s) / ED Diagnoses   Final diagnoses:  Hypertension, unspecified type    New Prescriptions New Prescriptions   HYDROCHLOROTHIAZIDE (HYDRODIURIL) 25 MG TABLET    Take 1 tablet (25 mg total) by mouth daily.     Delos Haring, PA-C 11/26/15 0236    Jola Schmidt, MD 11/26/15 [phone]

## 2015-11-26 NOTE — ED Notes (Signed)
Pt c/o hypertension for the past month. Pt was taken off of metoprolol due to creatinine levels. Pt reports she wasn't placed on any additional blood pressure medication. Has been having headaches x 2 weeks with difficulty remembering things at home.

## 2016-06-09 ENCOUNTER — Encounter: Payer: Self-pay | Admitting: Internal Medicine

## 2016-06-11 DIAGNOSIS — E876 Hypokalemia: Secondary | ICD-10-CM | POA: Insufficient documentation

## 2016-06-11 DIAGNOSIS — R519 Headache, unspecified: Secondary | ICD-10-CM | POA: Insufficient documentation

## 2016-06-11 DIAGNOSIS — R9431 Abnormal electrocardiogram [ECG] [EKG]: Secondary | ICD-10-CM | POA: Insufficient documentation

## 2016-06-11 DIAGNOSIS — I10 Essential (primary) hypertension: Secondary | ICD-10-CM | POA: Insufficient documentation

## 2016-06-12 DIAGNOSIS — N183 Chronic kidney disease, stage 3 unspecified: Secondary | ICD-10-CM | POA: Insufficient documentation

## 2016-09-28 ENCOUNTER — Other Ambulatory Visit: Payer: Self-pay | Admitting: Nephrology

## 2016-09-28 DIAGNOSIS — N183 Chronic kidney disease, stage 3 unspecified: Secondary | ICD-10-CM

## 2016-09-28 DIAGNOSIS — I10 Essential (primary) hypertension: Secondary | ICD-10-CM

## 2017-03-06 ENCOUNTER — Other Ambulatory Visit: Payer: Self-pay

## 2017-03-06 ENCOUNTER — Emergency Department (HOSPITAL_COMMUNITY)
Admission: EM | Admit: 2017-03-06 | Discharge: 2017-03-07 | Disposition: A | Discharge status: Home / Self Care | Payer: Medicaid Other | Attending: Emergency Medicine | Admitting: Emergency Medicine

## 2017-03-06 ENCOUNTER — Encounter (HOSPITAL_COMMUNITY): Payer: Self-pay

## 2017-03-06 ENCOUNTER — Emergency Department (HOSPITAL_COMMUNITY): Payer: Medicaid Other

## 2017-03-06 DIAGNOSIS — R7989 Other specified abnormal findings of blood chemistry: Secondary | ICD-10-CM | POA: Diagnosis not present

## 2017-03-06 DIAGNOSIS — F419 Anxiety disorder, unspecified: Secondary | ICD-10-CM | POA: Insufficient documentation

## 2017-03-06 DIAGNOSIS — Z79899 Other long term (current) drug therapy: Secondary | ICD-10-CM | POA: Insufficient documentation

## 2017-03-06 DIAGNOSIS — R002 Palpitations: Secondary | ICD-10-CM | POA: Insufficient documentation

## 2017-03-06 DIAGNOSIS — I10 Essential (primary) hypertension: Secondary | ICD-10-CM | POA: Insufficient documentation

## 2017-03-06 DIAGNOSIS — R51 Headache: Secondary | ICD-10-CM | POA: Diagnosis not present

## 2017-03-06 LAB — I-STAT BETA HCG BLOOD, ED (MC, WL, AP ONLY): I-stat hCG, quantitative: <5 m[IU]/mL (ref <5)

## 2017-03-06 LAB — I-STAT TROPONIN, ED
TROPONIN I, POC: 0 ng/mL (ref 0.00–0.08)
TROPONIN I, POC: 0 ng/mL (ref 0.00–0.08)

## 2017-03-06 LAB — BASIC METABOLIC PANEL
ANION GAP: 11 (ref 5–15)
BUN: 25 mg/dL — ABNORMAL HIGH (ref 6–20)
CALCIUM: 9.3 mg/dL (ref 8.9–10.3)
CO2: 25 mmol/L (ref 22–32)
Chloride: 103 mmol/L (ref 101–111)
Creatinine, Ser: 2.07 mg/dL — ABNORMAL HIGH (ref 0.44–1.00)
GFR, EST AFRICAN AMERICAN: 32 mL/min — AB (ref >60)
GFR, EST NON AFRICAN AMERICAN: 27 mL/min — AB (ref >60)
GLUCOSE: 107 mg/dL — AB (ref 65–99)
Potassium: 4.7 mmol/L (ref 3.5–5.1)
Sodium: 139 mmol/L (ref 135–145)

## 2017-03-06 LAB — CBC
HEMATOCRIT: 41.4 % (ref 36.0–46.0)
HEMOGLOBIN: 13.4 g/dL (ref 12.0–15.0)
MCH: 28.3 pg (ref 26.0–34.0)
MCHC: 32.4 g/dL (ref 30.0–36.0)
MCV: 87.3 fL (ref 78.0–100.0)
Platelets: 365 10*3/uL (ref 150–400)
RBC: 4.74 MIL/uL (ref 3.87–5.11)
RDW: 12.7 % (ref 11.5–15.5)
WBC: 9.3 10*3/uL (ref 4.0–10.5)

## 2017-03-06 LAB — D-DIMER, QUANTITATIVE: D-Dimer, Quant: 0.49 ug/mL-FEU (ref 0.00–0.50)

## 2017-03-06 MED ORDER — SODIUM CHLORIDE 0.9 % IV BOLUS (SEPSIS)
1000.0000 mL | Freq: Once | INTRAVENOUS | Status: AC
  Administered 2017-03-07: 1000 mL via INTRAVENOUS

## 2017-03-06 MED ORDER — ONDANSETRON 4 MG PO TBDP
4.0000 mg | ORAL_TABLET | Freq: Once | ORAL | Status: AC
  Administered 2017-03-06: 4 mg via ORAL
  Filled 2017-03-06: qty 1

## 2017-03-06 MED ORDER — LORAZEPAM 1 MG PO TABS
1.0000 mg | ORAL_TABLET | Freq: Once | ORAL | Status: AC
  Administered 2017-03-06: 1 mg via ORAL
  Filled 2017-03-06: qty 1

## 2017-03-06 NOTE — ED Provider Notes (Signed)
[facility] EMERGENCY DEPARTMENT Provider Note   CSN: [id number] Arrival date & time: 03/06/17  1850     History   Chief Complaint Chief Complaint  Patient presents with  . Palpitations    HPI Tina Richardson is a 47 y.o. female.  Patient presents from PCPs office with sensation of palpitations.  Patient states she woke up this morning with a headache associated with a feeling of flushed all over, racing heart, palpitations with nausea.  She has hyperventilation during these episodes associated with anxiety and a flushed feeling.  She states she has had these episodes intermittently for the past several weeks today lasted longer than usual.  She was seen by her PCP and was reportedly told she was unsafe to drive home and needed to come to the ED.  It sounds like she was having a panic attack in her PCPs office.  She is not prescribed any medications for anxiety.  She denies any focal weakness, numbness or tingling.  Denies any fever.  Denies any chest pain or shortness of breath.  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   The history is provided by the patient and a relative.  Palpitations   Associated symptoms include headaches, dizziness and weakness. Pertinent negatives include no fever, no abdominal pain, no nausea, no vomiting, no cough and no shortness of breath.    Past Medical History:  Diagnosis Date  . Anxiety   . Gestational diabetes    diet controlled per patient  . Heart palpitations   . High-risk pregnancy   . Hypertension   . Multiparity     Patient Active Problem List   Diagnosis Date Noted  . Failure to progress in labor 02/10/2012  . Cesarean delivery delivered 02/10/2012  . S/P cesarean section 02/06/2012  . GBS (group B streptococcus) UTI complicating pregnancy 60/10/9321  . LGA (large for gestational age) fetus affecting mother, antepartum 01/23/2012  . Pregnancy,  supervision for, high-risk 01/23/2012  . Polyhydramnios, antepartum 01/03/2012  . Gestational Diabetes 10/10/2011  . AMA (advanced maternal age) multigravida 35+ 08/31/2011  . [place] multipara 08/31/2011  . Blood pressure elevated 08/31/2011  . Obesity complicating pregnancy 55/73/2202  . Fibroids 08/31/2011  . BV (bacterial vaginosis) 08/31/2011    Past Surgical History:  Procedure Laterality Date  . CESAREAN SECTION  02/06/2012   Procedure: CESAREAN SECTION;  Surgeon: Osborne Oman, MD;  Location: [facility];  Service: Obstetrics;  Laterality: N/A;  Primary Cesarean Section Delivery Baby Boy @ 0000, Apgars 8/9   . NO PAST SURGERIES      OB History    Gravida Para Term Preterm AB Living   8 8 8     8    SAB TAB Ectopic Multiple Live Births           2       Home Medications    Prior to Admission medications   Medication Sig Start Date End Date Taking? Authorizing Provider  atenolol-chlorthalidone (TENORETIC) 50-25 MG tablet Take 1 tablet by mouth daily.   Yes [provider]  clonazePAM (KLONOPIN) 0.5 MG tablet Take 0.5 mg by mouth 2 (two) times daily as needed for anxiety.   Yes [provider]  naproxen (NAPROSYN) 500 MG tablet Take 500 mg by mouth daily as needed (pain).   Yes [provider]  spironolactone (ALDACTONE) 25 MG tablet Take 25 mg by mouth 2 (two) times daily. 06/12/16  06/12/17 Yes [provider]  diclofenac (CATAFLAM) 50 MG tablet Take 1 tablet (50 mg total) by mouth 3 (three) times daily. One tablet TID with food prn pain. Patient not taking: Reported on 11/26/2015 06/09/15   Janne Napoleon, NP  Enalapril-Hydrochlorothiazide 5-12.5 MG per tablet Take 1 tablet by mouth daily. Patient not taking: Reported on 11/26/2015 12/16/13   Orlie Dakin, MD  hydrochlorothiazide (HYDRODIURIL) 25 MG tablet Take 1 tablet (25 mg total) by mouth daily. Patient not taking: Reported on 11/26/2015 05/20/13   Calvert Cantor, MD  hydrochlorothiazide  (HYDRODIURIL) 25 MG tablet Take 1 tablet (25 mg total) by mouth daily. Patient not taking: Reported on 03/06/2017 11/26/15   Delos Haring, PA-C  ibuprofen (ADVIL,MOTRIN) 600 MG tablet Take 1 tablet (600 mg total) by mouth every 6 (six) hours as needed. Patient not taking: Reported on 11/26/2015 03/12/12   Martha Clan, MD  metFORMIN (GLUCOPHAGE) 500 MG tablet Take 1 tablet (500 mg total) by mouth 2 (two) times daily with a meal. Patient not taking: Reported on 11/26/2015 06/16/15   Maren Reamer, MD  metoprolol (LOPRESSOR) 50 MG tablet Take 1 tablet (50 mg total) by mouth 2 (two) times daily. Patient not taking: Reported on 11/26/2015 06/15/15   Maren Reamer, MD  Norgestimate-Ethinyl Estradiol Triphasic 0.18/0.215/0.25 MG-35 MCG tablet Take 1 tablet by mouth daily. Patient not taking: Reported on 11/26/2015 06/16/15   Lottie Mussel T, MD  lisinopril (PRINIVIL,ZESTRIL) 20 MG tablet Take 1 tablet (20 mg total) by mouth once. 11/20/12 05/20/13  Fransico Meadow, PA-C    Family History Family History  Problem Relation Age of Onset  . Diabetes Mother   . Hypertension Father   . Diabetes Father   . COPD Father   . Asthma Father   . Other Neg Hx     Social History Social History   Tobacco Use  . Smoking status: Never Smoker  . Smokeless tobacco: Never Used  Substance Use Topics  . Alcohol use: Yes    Comment: occasionally  . Drug use: No     Allergies   Dilaudid [hydromorphone hcl] and Norvasc [amlodipine besylate]   Review of Systems Review of Systems  Constitutional: Positive for fatigue. Negative for activity change, appetite change and fever.  HENT: Negative for congestion.   Respiratory: Negative for cough, chest tightness and shortness of breath.   Cardiovascular: Positive for palpitations.  Gastrointestinal: Negative for abdominal pain, nausea and vomiting.  Genitourinary: Negative for dysuria and hematuria.  Musculoskeletal: Negative for arthralgias and myalgias.    Neurological: Positive for dizziness, weakness, light-headedness and headaches.    all other systems are negative except as noted in the HPI and PMH.    Physical Exam Updated Vital Signs BP 117/77   Pulse 76   Temp 98.2 F (36.8 C)   Resp 19   Wt 110.7 kg (244 lb)   LMP 02/20/2017   SpO2 97%   BMI 35.01 kg/m   Physical Exam  Constitutional: She is oriented to person, place, and time. She appears well-developed and well-nourished. No distress.  Anxious and tearful   HENT:  Head: Normocephalic and atraumatic.  Mouth/Throat: Oropharynx is clear and moist. No oropharyngeal exudate.  Eyes: Conjunctivae and EOM are normal. Pupils are equal, round, and reactive to light.  Neck: Normal range of motion. Neck supple.  No meningismus.  Cardiovascular: Normal rate, regular rhythm, normal heart sounds and intact distal pulses.  No murmur heard. Pulmonary/Chest: Effort normal and breath sounds normal. No  respiratory distress. She exhibits no tenderness.  Abdominal: Soft. There is no tenderness. There is no rebound and no guarding.  Musculoskeletal: Normal range of motion. She exhibits no edema or tenderness.  Neurological: She is alert and oriented to person, place, and time. No cranial nerve deficit. She exhibits normal muscle tone. Coordination normal.  No ataxia on finger to nose bilaterally. No pronator drift. 5/5 strength throughout. CN 2-12 intact.Equal grip strength. Sensation intact.   Skin: Skin is warm. Capillary refill takes less than 2 seconds. No rash noted.  Psychiatric: She has a normal mood and affect. Her behavior is normal.  Nursing note and vitals reviewed.    ED Treatments / Results  Labs (all labs ordered are listed, but only abnormal results are displayed) Labs Reviewed  BASIC METABOLIC PANEL - Abnormal; Notable for the following components:      Result Value   Glucose, Bld 107 (*)    BUN 25 (*)    Creatinine, Ser 2.07 (*)    GFR calc non Af Amer 27 (*)     GFR calc Af Amer 32 (*)    All other components within normal limits  CBC  D-DIMER, QUANTITATIVE (NOT AT [facility])  I-STAT TROPONIN, ED  I-STAT TROPONIN, ED  I-STAT BETA HCG BLOOD, ED ([facility], [facility], [facility] ONLY)    EKG  EKG Interpretation  Date/Time:  Monday March 06 2017 18:55:41 EST Ventricular Rate:  70 PR Interval:  170 QRS Duration: 84 QT Interval:  400 QTC Calculation: 432 R Axis:   58 Text Interpretation:  Normal sinus rhythm Normal ECG No significant change was found Confirmed by Ezequiel Essex [phone]) on 03/06/2017 11:03:27 PM       Radiology Dg Chest 2 View  Result Date: 03/06/2017 CLINICAL DATA:  47 year old female with chest pain and palpitations for 2 days. EXAM: CHEST  2 VIEW COMPARISON:  Chest radiographs 06/08/2015 and earlier. FINDINGS: Stable an normal lung volumes. The heart size and mediastinal contours remain normal. Both lungs are clear. No pneumothorax or pleural effusion. Stable visualized osseous structures. Negative visible bowel gas pattern. IMPRESSION: Negative.  No acute cardiopulmonary abnormality. Electronically Signed   By: Genevie Ann M.D.   On: 03/06/2017 19:22   Ct Head Wo Contrast  Result Date: 03/07/2017 CLINICAL DATA:  Acute onset of altered mental status. Left eye twitching. EXAM: CT HEAD WITHOUT CONTRAST TECHNIQUE: Contiguous axial images were obtained from the base of the skull through the vertex without intravenous contrast. COMPARISON:  CT of the head performed 11/26/2015 FINDINGS: Brain: No evidence of acute infarction, hemorrhage, hydrocephalus, extra-axial collection or mass lesion/mass effect. The posterior fossa, including the cerebellum, brainstem and fourth ventricle, is within normal limits. The third and lateral ventricles, and basal ganglia are unremarkable in appearance. The cerebral hemispheres are symmetric in appearance, with normal gray-white differentiation. No mass effect or midline shift is seen. Vascular: No hyperdense vessel or  unexpected calcification. Skull: There is no evidence of fracture; visualized osseous structures are unremarkable in appearance. Sinuses/Orbits: The orbits are within normal limits. The paranasal sinuses and mastoid air cells are well-aerated. Other: No significant soft tissue abnormalities are seen. IMPRESSION: Unremarkable noncontrast CT of the head. Electronically Signed   By: Garald Balding M.D.   On: 03/07/2017 00:22    Procedures Procedures (including critical care time)  Medications Ordered in ED Medications  LORazepam (ATIVAN) tablet 1 mg (1 mg Oral Given 03/06/17 2320)  ondansetron (ZOFRAN-ODT) disintegrating tablet 4 mg (4 mg Oral Given 03/06/17 2321)  Initial Impression / Assessment and Plan / ED Course  I have reviewed the triage vital signs and the nursing notes.  Pertinent labs & imaging results that were available during my care of the patient were reviewed by me and considered in my medical decision making (see chart for details).    Patient with sensation of palpitations, flushed feeling, headache with nausea.  Sent from PCPs office due to the sensations.  Initially she is calm on evaluation and becomes tearful and anxious and hyperventilating.  She is given p.o. Ativan.  EKG shows sinus rhythm without any prolonged QT or Brugada.  Orthostatics are negative.  Patient labs show mild elevation of creatinine compared to baseline.  Will hydrate. Creatinine 2.0, baseline appears to be 1.4.  During initial evaluation patient had an episode of hyperventilation with tearfulness, palpitations and anxiety.  This improved after p.o. Ativan.  Troponin negative x2.  D-dimer negative.  Low suspicion for ACS or pulmonary embolism.  Suspect patient with palpitations secondary to anxiety.  She was prescribed Klonopin by her PCP today which she has not yet taken.  She is much improved after receiving Ativan.  Low suspicion for ACS or arrhythmia.  Patient hydrated in the ED.  She is  tolerating p.o.  Discussed elevated creatinine and need for PCP follow-up for recheck.  Return precautions discussed.  Final Clinical Impressions(s) / ED Diagnoses   Final diagnoses:  Palpitations  Anxiety  Elevated serum creatinine    ED Discharge Orders    None       Klein Willcox, Annie Main, MD 03/07/17 0405

## 2017-03-06 NOTE — ED Notes (Signed)
Orthostatics Lying: BP 117/77 HR 75  Sitting: BP 128/94 HR 77  Standing:  BP 124/99 HR 74

## 2017-03-06 NOTE — ED Notes (Signed)
As the physician and I were in the room and speaking to patient; patient's left eye began twitching and patient was hyperventilating. Patient became tachycardic and  tearful as well. States that she has had panic attacks before in the past.

## 2017-03-06 NOTE — ED Triage Notes (Signed)
Pt presents to the ed today for complaints of palpitations x 2 days says usually she will feel it and it will subside but today it did not. Denies any chest pain or shortness of breath. Was told to come her by her PCP. No distress in triage.

## 2017-03-07 ENCOUNTER — Emergency Department (HOSPITAL_COMMUNITY): Payer: Medicaid Other

## 2017-03-07 MED ORDER — SODIUM CHLORIDE 0.9 % IV BOLUS (SEPSIS)
1000.0000 mL | Freq: Once | INTRAVENOUS | Status: AC
  Administered 2017-03-07: 1000 mL via INTRAVENOUS

## 2017-03-07 NOTE — Discharge Instructions (Signed)
Your symptoms are likely due to anxiety.  Take the anxiety medicine prescribed by your doctor today as prescribed.  Follow-up for recheck of your kidney function next week.  Return to the ED if you develop chest pain, shortness of breath or any other concerns.

## 2017-05-24 ENCOUNTER — Other Ambulatory Visit: Payer: Self-pay

## 2017-05-24 ENCOUNTER — Emergency Department (HOSPITAL_COMMUNITY): Payer: Medicaid Other

## 2017-05-24 ENCOUNTER — Encounter (HOSPITAL_COMMUNITY): Payer: Self-pay

## 2017-05-24 ENCOUNTER — Emergency Department (HOSPITAL_COMMUNITY)
Admission: EM | Admit: 2017-05-24 | Discharge: 2017-05-25 | Disposition: A | Discharge status: Home / Self Care | Payer: Medicaid Other | Attending: Emergency Medicine | Admitting: Emergency Medicine

## 2017-05-24 DIAGNOSIS — R0602 Shortness of breath: Secondary | ICD-10-CM

## 2017-05-24 DIAGNOSIS — R5383 Other fatigue: Secondary | ICD-10-CM

## 2017-05-24 DIAGNOSIS — R519 Headache, unspecified: Secondary | ICD-10-CM

## 2017-05-24 DIAGNOSIS — Z7984 Long term (current) use of oral hypoglycemic drugs: Secondary | ICD-10-CM | POA: Diagnosis not present

## 2017-05-24 DIAGNOSIS — R51 Headache: Secondary | ICD-10-CM | POA: Insufficient documentation

## 2017-05-24 DIAGNOSIS — Z79899 Other long term (current) drug therapy: Secondary | ICD-10-CM | POA: Diagnosis not present

## 2017-05-24 DIAGNOSIS — I10 Essential (primary) hypertension: Secondary | ICD-10-CM | POA: Diagnosis not present

## 2017-05-24 LAB — BASIC METABOLIC PANEL
Anion gap: 8 (ref 5–15)
BUN: 29 mg/dL — AB (ref 6–20)
CHLORIDE: 108 mmol/L (ref 101–111)
CO2: 26 mmol/L (ref 22–32)
CREATININE: 1.43 mg/dL — AB (ref 0.44–1.00)
Calcium: 9.2 mg/dL (ref 8.9–10.3)
GFR calc Af Amer: 50 mL/min — ABNORMAL LOW (ref >60)
GFR calc non Af Amer: 43 mL/min — ABNORMAL LOW (ref >60)
Glucose, Bld: 115 mg/dL — ABNORMAL HIGH (ref 65–99)
Potassium: 3.6 mmol/L (ref 3.5–5.1)
Sodium: 142 mmol/L (ref 135–145)

## 2017-05-24 LAB — URINALYSIS, ROUTINE W REFLEX MICROSCOPIC
BILIRUBIN URINE: NEGATIVE
Bacteria, UA: NONE SEEN
GLUCOSE, UA: NEGATIVE mg/dL
Ketones, ur: NEGATIVE mg/dL
NITRITE: NEGATIVE
PH: 5 (ref 5.0–8.0)
Protein, ur: NEGATIVE mg/dL
SPECIFIC GRAVITY, URINE: 1.016 (ref 1.005–1.030)

## 2017-05-24 LAB — CBC
HEMATOCRIT: 40.5 % (ref 36.0–46.0)
Hemoglobin: 13 g/dL (ref 12.0–15.0)
MCH: 27.7 pg (ref 26.0–34.0)
MCHC: 32.1 g/dL (ref 30.0–36.0)
MCV: 86.4 fL (ref 78.0–100.0)
PLATELETS: 300 10*3/uL (ref 150–400)
RBC: 4.69 MIL/uL (ref 3.87–5.11)
RDW: 13.8 % (ref 11.5–15.5)
WBC: 8.3 10*3/uL (ref 4.0–10.5)

## 2017-05-24 LAB — I-STAT TROPONIN, ED: Troponin i, poc: 0 ng/mL (ref 0.00–0.08)

## 2017-05-24 LAB — I-STAT BETA HCG BLOOD, ED (MC, WL, AP ONLY)

## 2017-05-24 NOTE — ED Triage Notes (Signed)
Pt endorses headache that began today. Pt took BP and it was 147/103 after taking her second dose of BP meds today. Also endorses heart palpitations intermittent throughout the day.

## 2017-05-25 LAB — I-STAT TROPONIN, ED: Troponin i, poc: 0 ng/mL (ref 0.00–0.08)

## 2017-05-25 MED ORDER — SODIUM CHLORIDE 0.9 % IV BOLUS
1000.0000 mL | Freq: Once | INTRAVENOUS | Status: AC
  Administered 2017-05-25: 1000 mL via INTRAVENOUS

## 2017-05-25 MED ORDER — LORAZEPAM 0.5 MG PO TABS
0.5000 mg | ORAL_TABLET | Freq: Once | ORAL | Status: AC
Start: 2017-05-25 — End: 2017-05-25
  Administered 2017-05-25: 0.5 mg via ORAL
  Filled 2017-05-25: qty 1

## 2017-05-25 NOTE — ED Notes (Signed)
Pt reports being hypertensive while at work. Pt states she took repeated blood pressure checks and her pressure kept climbing up. Pt reports she has a history of anxiety attacks but did not take her medicine. Pt states she is just worried about her blood pressure because she was just diagnosed with stage III kidney disease.

## 2017-05-25 NOTE — ED Provider Notes (Signed)
[facility] EMERGENCY DEPARTMENT Provider Note   CSN: [id number] Arrival date & time: 05/24/17  2023     History   Chief Complaint Chief Complaint  Patient presents with  . Hypertension  . Palpitations  . Headache    HPI Tina Richardson is a 47 y.o. female.  HPI 47 year old African-American female past medical history significant for hypertension, anxiety that presents to the emergency department today for multiple complaints.  Patient complains of not feeling well while at work today.  She states that she has been having intermittent headaches.  Denies worse headache of her life.  States that it is frontal headache.  States it is sharp in nature.  Took her blood pressure and it was elevated.  The patient states that she is on spironolactone and lisinopril-HCTZ for blood pressure.  She states that she has not been taking this correctly because she has a kidney problem does not want to mess her kidney function up.  Patient also states that she has been very anxious and stressed over the past week because her adult son moved home with his son and has been causing her some stress.  She states that she went home to try to eat something and to drink some water.  She states that this did not help her.  She states that she may be dehydrated.  She reports some palpitations throughout the day that has been intermittent but denies any at this time.  She reports some mild shortness of breath intermittently but denies any at this time.  Patient denies any associated chest pain.  She denies any cardiac history.  Denies any significant family cardiac history.  She denies any history of DVT/PE, prolonged immobilization, recent hospitalization/surgeries, unilateral leg swelling or calf tenderness, hemoptysis, cancer diagnosis, OCP use or hormone therapy.  Denies any known sick contacts.  Denies any associated diarrhea, vomiting, abdominal pain, URI symptoms.  Pt denies any fever,  chill,vision changes, lightheadedness, dizziness, congestion, neck pain, cp, cough, abd pain, n/v/d, urinary symptoms, change in bowel habits, melena, hematochezia, lower extremity paresthesias.  Past Medical History:  Diagnosis Date  . Anxiety   . Gestational diabetes    diet controlled per patient  . Heart palpitations   . High-risk pregnancy   . Hypertension   . Multiparity     Patient Active Problem List   Diagnosis Date Noted  . Failure to progress in labor 02/10/2012  . Cesarean delivery delivered 02/10/2012  . S/P cesarean section 02/06/2012  . GBS (group B streptococcus) UTI complicating pregnancy 59/56/3875  . LGA (large for gestational age) fetus affecting mother, antepartum 01/23/2012  . Pregnancy, supervision for, high-risk 01/23/2012  . Polyhydramnios, antepartum 01/03/2012  . Gestational Diabetes 10/10/2011  . AMA (advanced maternal age) multigravida 35+ 08/31/2011  . [place] multipara 08/31/2011  . Blood pressure elevated 08/31/2011  . Obesity complicating pregnancy 64/33/2951  . Fibroids 08/31/2011  . BV (bacterial vaginosis) 08/31/2011    Past Surgical History:  Procedure Laterality Date  . CESAREAN SECTION  02/06/2012   Procedure: CESAREAN SECTION;  Surgeon: Osborne Oman, MD;  Location: [facility];  Service: Obstetrics;  Laterality: N/A;  Primary Cesarean Section Delivery Baby Boy @ 0000, Apgars 8/9   . NO PAST SURGERIES       OB History    Gravida  8   Para  8   Term  8   Preterm      AB      Living  8  SAB      TAB      Ectopic      Multiple      Live Births  2            Home Medications    Prior to Admission medications   Medication Sig Start Date End Date Taking? Authorizing Provider  clonazePAM (KLONOPIN) 0.5 MG tablet Take 0.5 mg by mouth 2 (two) times daily as needed for anxiety.   Yes [provider]  hydrochlorothiazide (HYDRODIURIL) 25 MG tablet Take 1 tablet (25 mg total) by mouth daily. 11/26/15  Yes  Carlota Raspberry, Tiffany, PA-C  lisinopril (PRINIVIL,ZESTRIL) 20 MG tablet Take 10 mg by mouth daily. 06/26/14  Yes [provider]  spironolactone (ALDACTONE) 25 MG tablet Take 12.5 mg by mouth 2 (two) times daily.  06/12/16 06/12/17 Yes [provider]  diclofenac (CATAFLAM) 50 MG tablet Take 1 tablet (50 mg total) by mouth 3 (three) times daily. One tablet TID with food prn pain. Patient not taking: Reported on 11/26/2015 06/09/15   Janne Napoleon, NP  Enalapril-Hydrochlorothiazide 5-12.5 MG per tablet Take 1 tablet by mouth daily. Patient not taking: Reported on 11/26/2015 12/16/13   Orlie Dakin, MD  hydrochlorothiazide (HYDRODIURIL) 25 MG tablet Take 1 tablet (25 mg total) by mouth daily. Patient not taking: Reported on 11/26/2015 05/20/13   Calvert Cantor, MD  ibuprofen (ADVIL,MOTRIN) 600 MG tablet Take 1 tablet (600 mg total) by mouth every 6 (six) hours as needed. Patient not taking: Reported on 11/26/2015 03/12/12   Martha Clan, MD  metFORMIN (GLUCOPHAGE) 500 MG tablet Take 1 tablet (500 mg total) by mouth 2 (two) times daily with a meal. Patient not taking: Reported on 11/26/2015 06/16/15   Maren Reamer, MD  metoprolol (LOPRESSOR) 50 MG tablet Take 1 tablet (50 mg total) by mouth 2 (two) times daily. Patient not taking: Reported on 11/26/2015 06/15/15   Maren Reamer, MD  Norgestimate-Ethinyl Estradiol Triphasic 0.18/0.215/0.25 MG-35 MCG tablet Take 1 tablet by mouth daily. Patient not taking: Reported on 11/26/2015 06/16/15   Maren Reamer, MD    Family History Family History  Problem Relation Age of Onset  . Diabetes Mother   . Hypertension Father   . Diabetes Father   . COPD Father   . Asthma Father   . Other Neg Hx     Social History Social History   Tobacco Use  . Smoking status: Never Smoker  . Smokeless tobacco: Never Used  Substance Use Topics  . Alcohol use: Yes    Comment: occasionally  . Drug use: No     Allergies   Dilaudid [hydromorphone  hcl] and Norvasc [amlodipine besylate]   Review of Systems Review of Systems  All other systems reviewed and are negative.    Physical Exam Updated Vital Signs BP (!) 138/94   Pulse 82   Temp 98 F (36.7 C) (Oral)   Resp 16   Ht 5' 6.75" (1.695 m)   Wt 105.7 kg (233 lb)   LMP 03/24/2017 (Approximate)   SpO2 98%   BMI 36.77 kg/m   Physical Exam  Constitutional: She is oriented to person, place, and time. She appears well-developed and well-nourished.  Non-toxic appearance. No distress.  HENT:  Head: Normocephalic and atraumatic.  Nose: Nose normal.  Mouth/Throat: Oropharynx is clear and moist.  Eyes: Pupils are equal, round, and reactive to light. Conjunctivae and EOM are normal. Right eye exhibits no discharge. Left eye exhibits no discharge.  Neck: Normal  range of motion. Neck supple. No JVD present. No tracheal deviation present.  Cardiovascular: Normal rate, regular rhythm, normal heart sounds and intact distal pulses. Exam reveals no gallop and no friction rub.  No murmur heard. Pulmonary/Chest: Effort normal and breath sounds normal. No stridor. No respiratory distress. She has no wheezes. She has no rales. She exhibits no tenderness.  No hypoxia or tachypnea.  Abdominal: Soft. Bowel sounds are normal. She exhibits no distension. There is no tenderness. There is no rebound and no guarding.  Musculoskeletal: Normal range of motion.  No lower extremity edema or calf tenderness.  Lymphadenopathy:    She has no cervical adenopathy.  Neurological: She is alert and oriented to person, place, and time.  The patient is alert, attentive, and oriented x 3. Speech is clear. Cranial nerve II-VII grossly intact. Negative pronator drift. Sensation intact. Strength 5/5 in all extremities. Reflexes 2+ and symmetric at biceps, triceps, knees, and ankles. Rapid alternating movement and fine finger movements intact.    Skin: Skin is warm and dry. Capillary refill takes less than 2  seconds. She is not diaphoretic.  Psychiatric: Her behavior is normal. Judgment and thought content normal.  Nursing note and vitals reviewed.    ED Treatments / Results  Labs (all labs ordered are listed, but only abnormal results are displayed) Labs Reviewed  BASIC METABOLIC PANEL - Abnormal; Notable for the following components:      Result Value   Glucose, Bld 115 (*)    BUN 29 (*)    Creatinine, Ser 1.43 (*)    GFR calc non Af Amer 43 (*)    GFR calc Af Amer 50 (*)    All other components within normal limits  URINALYSIS, ROUTINE W REFLEX MICROSCOPIC - Abnormal; Notable for the following components:   Hgb urine dipstick SMALL (*)    Leukocytes, UA MODERATE (*)    All other components within normal limits  CBC  I-STAT TROPONIN, ED  I-STAT BETA HCG BLOOD, ED ([facility], [facility], [facility] ONLY)  I-STAT TROPONIN, ED    EKG EKG Interpretation  Date/Time:  Wednesday May 24 2017 20:38:40 EDT Ventricular Rate:  75 PR Interval:  174 QRS Duration: 86 QT Interval:  394 QTC Calculation: 439 R Axis:   79 Text Interpretation:  Normal sinus rhythm Normal ECG When compared with ECG of 03/06/2017, No significant change was found Confirmed by Delora Fuel ([id number]) on 05/24/2017 11:40:41 PM   Radiology Dg Chest 2 View  Result Date: 05/24/2017 CLINICAL DATA:  Headache beginning today. High blood pressure. Palpitations. EXAM: CHEST - 2 VIEW COMPARISON:  03/06/2017 FINDINGS: The heart size and mediastinal contours are within normal limits. Both lungs are clear. The visualized skeletal structures are unremarkable. IMPRESSION: No active cardiopulmonary disease. Electronically Signed   By: Lucienne Capers M.D.   On: 05/24/2017 22:06    Procedures Procedures (including critical care time)  Medications Ordered in ED Medications  sodium chloride 0.9 % bolus 1,000 mL (0 mLs Intravenous Stopped 05/25/17 0345)  LORazepam (ATIVAN) tablet 0.5 mg (0.5 mg Oral Given 05/25/17 0244)     Initial Impression /  Assessment and Plan / ED Course  I have reviewed the triage vital signs and the nursing notes.  Pertinent labs & imaging results that were available during my care of the patient were reviewed by me and considered in my medical decision making (see chart for details).     Patient presents to the ED for multiple complaints.  She is overall well-appearing  and nontoxic on examination.  Her vital signs are reassuring.  Mild hypertension that has gradually improved while in the ED.  She is afebrile no tachycardia noted.  No hypoxia noted.  Lungs clear to auscultation bilaterally.  Heart regular rate and rhythm.  No focal abdominal tenderness.  No focal neuro deficit on exam.  Neurovascularly intact in all extremities.  Patient's lab work is reassuring.  No leukocytosis.  Any function is 1.43 with history of CKD however this is improved from 2 weeks ago.  Otherwise electrolytes are reassuring.  Negative delta troponin.  UA shows no signs of infection.  Chest x-ray without any acute findings.  EKG shows normal sinus rhythm and appears similar to prior tracing without any signs of acute ischemia as reviewed by myself.  Patient reports headache with elevated blood pressure today.  Patient denies worse headache of her life.  No focal neuro deficits.  Doubt ICH, SAH, angitis, temporal arteritis.  Headache has improved with fluids and reduction in blood pressure.  Patient also reports some palpitations and shortness of breath.  She is PERC negative.  Low suspicion for PE.  Clinical presentation not seem consistent with ACS or dissection.  Reassuring the troponin and EKG.  No signs of end-stage organ damage or hypertensive emergency.  Suspect the patient's symptoms may be related to her anxiety.  Was given a small dose of Ativan in the ED.  She was given fluids.  On repeat assessment she feels significantly improved.  She states that she is ready for discharge.  Encouraged  follow-up with her primary care  doctor for further blood pressure management.  Pt is hemodynamically stable, in NAD, & able to ambulate in the ED. Evaluation does not show pathology that would require ongoing emergent intervention or inpatient treatment. I explained the diagnosis to the patient. Pain has been managed & has no complaints prior to dc. Pt is comfortable with above plan and is stable for discharge at this time. All questions were answered prior to disposition. Strict return precautions for f/u to the ED were discussed. Encouraged follow up with PCP.   Final Clinical Impressions(s) / ED Diagnoses   Final diagnoses:  Hypertension, unspecified type  Nonintractable headache, unspecified chronicity pattern, unspecified headache type  SOB (shortness of breath)  Other fatigue    ED Discharge Orders    None       Aaron Edelman 05/25/17 0612    Ward, Delice Bison, DO 05/25/17 [phone]

## 2017-05-25 NOTE — Discharge Instructions (Addendum)
Your work-up has been reassuring in the emergency department.  Please follow-up with your kidney doctor to have your blood pressure medicine adjusted.  Return the ED with any worsening symptoms.

## 2017-05-30 ENCOUNTER — Emergency Department (HOSPITAL_COMMUNITY): Payer: Medicaid Other

## 2017-05-30 ENCOUNTER — Emergency Department (HOSPITAL_COMMUNITY)
Admission: EM | Admit: 2017-05-30 | Discharge: 2017-05-30 | Disposition: A | Discharge status: Home / Self Care | Payer: Medicaid Other | Attending: Emergency Medicine | Admitting: Emergency Medicine

## 2017-05-30 ENCOUNTER — Encounter (HOSPITAL_COMMUNITY): Payer: Self-pay | Admitting: Emergency Medicine

## 2017-05-30 DIAGNOSIS — R300 Dysuria: Secondary | ICD-10-CM | POA: Diagnosis present

## 2017-05-30 DIAGNOSIS — Z79899 Other long term (current) drug therapy: Secondary | ICD-10-CM | POA: Insufficient documentation

## 2017-05-30 DIAGNOSIS — N3001 Acute cystitis with hematuria: Secondary | ICD-10-CM | POA: Diagnosis not present

## 2017-05-30 DIAGNOSIS — Z7984 Long term (current) use of oral hypoglycemic drugs: Secondary | ICD-10-CM | POA: Insufficient documentation

## 2017-05-30 DIAGNOSIS — I10 Essential (primary) hypertension: Secondary | ICD-10-CM

## 2017-05-30 LAB — COMPREHENSIVE METABOLIC PANEL
ALBUMIN: 4.1 g/dL (ref 3.5–5.0)
ALT: 16 U/L (ref 14–54)
AST: 16 U/L (ref 15–41)
Alkaline Phosphatase: 59 U/L (ref 38–126)
Anion gap: 8 (ref 5–15)
BUN: 22 mg/dL — AB (ref 6–20)
CHLORIDE: 109 mmol/L (ref 101–111)
CO2: 24 mmol/L (ref 22–32)
Calcium: 9 mg/dL (ref 8.9–10.3)
Creatinine, Ser: 1.43 mg/dL — ABNORMAL HIGH (ref 0.44–1.00)
GFR calc Af Amer: 50 mL/min — ABNORMAL LOW (ref >60)
GFR calc non Af Amer: 43 mL/min — ABNORMAL LOW (ref >60)
Glucose, Bld: 116 mg/dL — ABNORMAL HIGH (ref 65–99)
POTASSIUM: 3.5 mmol/L (ref 3.5–5.1)
Sodium: 141 mmol/L (ref 135–145)
Total Bilirubin: 0.7 mg/dL (ref 0.3–1.2)
Total Protein: 7.6 g/dL (ref 6.5–8.1)

## 2017-05-30 LAB — URINALYSIS, ROUTINE W REFLEX MICROSCOPIC
Bilirubin Urine: NEGATIVE
GLUCOSE, UA: NEGATIVE mg/dL
Ketones, ur: NEGATIVE mg/dL
NITRITE: NEGATIVE
SPECIFIC GRAVITY, URINE: 1.019 (ref 1.005–1.030)
pH: 6 (ref 5.0–8.0)

## 2017-05-30 LAB — CBC
HEMATOCRIT: 42 % (ref 36.0–46.0)
HEMOGLOBIN: 13.5 g/dL (ref 12.0–15.0)
MCH: 27.3 pg (ref 26.0–34.0)
MCHC: 32.1 g/dL (ref 30.0–36.0)
MCV: 85 fL (ref 78.0–100.0)
Platelets: 324 10*3/uL (ref 150–400)
RBC: 4.94 MIL/uL (ref 3.87–5.11)
RDW: 13.4 % (ref 11.5–15.5)
WBC: 11.7 10*3/uL — AB (ref 4.0–10.5)

## 2017-05-30 LAB — WET PREP, GENITAL
Clue Cells Wet Prep HPF POC: NONE SEEN
Sperm: NONE SEEN
Trich, Wet Prep: NONE SEEN
Yeast Wet Prep HPF POC: NONE SEEN

## 2017-05-30 LAB — I-STAT CHEM 8, ED
BUN: 23 mg/dL — ABNORMAL HIGH (ref 6–20)
CHLORIDE: 105 mmol/L (ref 101–111)
CREATININE: 1.3 mg/dL — AB (ref 0.44–1.00)
Calcium, Ion: 1.15 mmol/L (ref 1.15–1.40)
GLUCOSE: 114 mg/dL — AB (ref 65–99)
HCT: 42 % (ref 36.0–46.0)
Hemoglobin: 14.3 g/dL (ref 12.0–15.0)
Potassium: 3.4 mmol/L — ABNORMAL LOW (ref 3.5–5.1)
Sodium: 142 mmol/L (ref 135–145)
TCO2: 26 mmol/L (ref 22–32)

## 2017-05-30 LAB — I-STAT BETA HCG BLOOD, ED (MC, WL, AP ONLY)

## 2017-05-30 MED ORDER — CEPHALEXIN 500 MG PO CAPS: 500.0000 mg | ORAL_CAPSULE | Freq: Four times a day (QID) | ORAL | 0 refills | Status: AC

## 2017-05-30 MED ORDER — CEPHALEXIN 500 MG PO CAPS
500.0000 mg | ORAL_CAPSULE | Freq: Once | ORAL | Status: AC
  Administered 2017-05-30: 500 mg via ORAL
  Filled 2017-05-30: qty 1

## 2017-05-30 MED ORDER — SODIUM CHLORIDE 0.9 % IV BOLUS
1000.0000 mL | Freq: Once | INTRAVENOUS | Status: AC
  Administered 2017-05-30: 1000 mL via INTRAVENOUS

## 2017-05-30 MED ORDER — ONDANSETRON HCL 4 MG/2ML IJ SOLN
4.0000 mg | Freq: Once | INTRAMUSCULAR | Status: AC
  Administered 2017-05-30: 4 mg via INTRAVENOUS
  Filled 2017-05-30: qty 2

## 2017-05-30 MED ORDER — KETOROLAC TROMETHAMINE 30 MG/ML IJ SOLN
30.0000 mg | Freq: Once | INTRAMUSCULAR | Status: AC
  Administered 2017-05-30: 30 mg via INTRAVENOUS
  Filled 2017-05-30: qty 1

## 2017-05-30 MED ORDER — ONDANSETRON HCL 4 MG PO TABS: 4.0000 mg | ORAL_TABLET | Freq: Three times a day (TID) | ORAL | 0 refills | Status: DC | PRN

## 2017-05-30 NOTE — Discharge Instructions (Signed)
Please take all of your antibiotics until finished!   You may develop abdominal discomfort or diarrhea from the antibiotic.  You may help offset this with probiotics which you can buy or get in yogurt. Do not eat  or take the probiotics until 2 hours after your antibiotic.   Take Zofran as needed for nausea.  Wait around 20 minutes before having anything to eat or drink to give this medicine time to work.  You may take 500 to 1000 mg of Tylenol every 6 hours as needed for pain.  Do not exceed 4000 mg of Tylenol daily.  Do not take ibuprofen for pain.  Continue to take your blood pressure medications as prescribed.  Call your nephrologist first thing tomorrow morning to set up a follow-up appointment for sometime this week.  I have attached the report for your CT scan today.  Return to the emergency department if any concerning signs or symptoms develop such as high fevers, persistent vomiting, or worsening pain.

## 2017-05-30 NOTE — ED Provider Notes (Signed)
[facility] DEPT Provider Note   CSN: [id number] Arrival date & time: 05/30/17  1330     History   Chief Complaint Chief Complaint  Patient presents with  . Hematuria  . Dysuria  . Back Pain    HPI Tina Richardson is a 47 y.o. female with history of anxiety, hypertension , obesity presents for evaluation of acute onset, progressively worsening urinary symptoms since this morning.  Patient endorses urinary urgency and frequency which began this morning as well as mild dysuria.  She then began noticing hematuria which she originally thought was vaginal bleeding.  She states that she has had irregular periods for the past couple of years and has only had 2 menstrual cycles in the past year. She states she thinks this is in fact hematuria, however.  She denies vaginal discharge, itching, or pain.  She is currently sexually active with one female partner but does not always use protection.  She also notes intermittent cramping right lower back pain.  Pain worsens during urination.  She also notes mild suprapubic pressure but denies any true abdominal pain.  She notes nausea associated with her pain but no vomiting.  Denies fevers, chills, chest pain, shortness of breath, diarrhea, melena, or hematochezia.  Has not tried anything for her symptoms.  Did not take her blood pressure medications today.  The history is provided by the patient.    Past Medical History:  Diagnosis Date  . Anxiety   . Gestational diabetes    diet controlled per patient  . Heart palpitations   . High-risk pregnancy   . Hypertension   . Multiparity     Patient Active Problem List   Diagnosis Date Noted  . Failure to progress in labor 02/10/2012  . Cesarean delivery delivered 02/10/2012  . S/P cesarean section 02/06/2012  . GBS (group B streptococcus) UTI complicating pregnancy 13/24/4010  . LGA (large for gestational age) fetus affecting mother, antepartum 01/23/2012  .  Pregnancy, supervision for, high-risk 01/23/2012  . Polyhydramnios, antepartum 01/03/2012  . Gestational Diabetes 10/10/2011  . AMA (advanced maternal age) multigravida 35+ 08/31/2011  . [place] multipara 08/31/2011  . Blood pressure elevated 08/31/2011  . Obesity complicating pregnancy 27/25/3664  . Fibroids 08/31/2011  . BV (bacterial vaginosis) 08/31/2011    Past Surgical History:  Procedure Laterality Date  . CESAREAN SECTION  02/06/2012   Procedure: CESAREAN SECTION;  Surgeon: Osborne Oman, MD;  Location: [facility];  Service: Obstetrics;  Laterality: N/A;  Primary Cesarean Section Delivery Baby Boy @ 0000, Apgars 8/9   . NO PAST SURGERIES       OB History    Gravida  8   Para  8   Term  8   Preterm      AB      Living  8     SAB      TAB      Ectopic      Multiple      Live Births  2            Home Medications    Prior to Admission medications   Medication Sig Start Date End Date Taking? Authorizing Provider  lisinopril-hydrochlorothiazide (PRINZIDE,ZESTORETIC) 20-12.5 MG tablet Take 0.5 tablets by mouth every evening.   Yes [provider]  LORazepam (ATIVAN) 0.5 MG tablet TK 1 T PO BID PRN ANXIETY 05/25/17  Yes [provider]  Methenamine-Sodium Salicylate (CYSTEX) 403-474.2 MG TABS Take 2 tablets by mouth daily as  needed (urinary pain).   Yes [provider]  spironolactone (ALDACTONE) 25 MG tablet Take 12.5 mg by mouth 2 (two) times daily.  06/12/16 06/12/17 Yes [provider]  cephALEXin (KEFLEX) 500 MG capsule Take 1 capsule (500 mg total) by mouth 4 (four) times daily for 7 days. 05/30/17 06/06/17  Rodell Perna A, PA-C  diclofenac (CATAFLAM) 50 MG tablet Take 1 tablet (50 mg total) by mouth 3 (three) times daily. One tablet TID with food prn pain. Patient not taking: Reported on 11/26/2015 06/09/15   Janne Napoleon, NP  Enalapril-Hydrochlorothiazide 5-12.5 MG per tablet Take 1 tablet by mouth daily. Patient not taking:  Reported on 11/26/2015 12/16/13   Orlie Dakin, MD  hydrochlorothiazide (HYDRODIURIL) 25 MG tablet Take 1 tablet (25 mg total) by mouth daily. Patient not taking: Reported on 11/26/2015 05/20/13   Calvert Cantor, MD  hydrochlorothiazide (HYDRODIURIL) 25 MG tablet Take 1 tablet (25 mg total) by mouth daily. Patient not taking: Reported on 05/30/2017 11/26/15   Delos Haring, PA-C  ibuprofen (ADVIL,MOTRIN) 600 MG tablet Take 1 tablet (600 mg total) by mouth every 6 (six) hours as needed. Patient not taking: Reported on 11/26/2015 03/12/12   Martha Clan, MD  metFORMIN (GLUCOPHAGE) 500 MG tablet Take 1 tablet (500 mg total) by mouth 2 (two) times daily with a meal. Patient not taking: Reported on 11/26/2015 06/16/15   Maren Reamer, MD  metoprolol (LOPRESSOR) 50 MG tablet Take 1 tablet (50 mg total) by mouth 2 (two) times daily. Patient not taking: Reported on 11/26/2015 06/15/15   Maren Reamer, MD  Norgestimate-Ethinyl Estradiol Triphasic 0.18/0.215/0.25 MG-35 MCG tablet Take 1 tablet by mouth daily. Patient not taking: Reported on 11/26/2015 06/16/15   Maren Reamer, MD  ondansetron (ZOFRAN) 4 MG tablet Take 1 tablet (4 mg total) by mouth every 8 (eight) hours as needed for nausea or vomiting. 05/30/17   Renita Papa, PA-C    Family History Family History  Problem Relation Age of Onset  . Diabetes Mother   . Hypertension Father   . Diabetes Father   . COPD Father   . Asthma Father   . Other Neg Hx     Social History Social History   Tobacco Use  . Smoking status: Never Smoker  . Smokeless tobacco: Never Used  Substance Use Topics  . Alcohol use: Yes    Comment: occasionally  . Drug use: No     Allergies   Dilaudid [hydromorphone hcl] and Norvasc [amlodipine besylate]   Review of Systems Review of Systems  Constitutional: Negative for chills and fever.  Respiratory: Negative for shortness of breath.   Cardiovascular: Negative for chest pain.  Gastrointestinal:  Positive for nausea. Negative for abdominal pain, diarrhea and vomiting.  Genitourinary: Positive for dysuria, flank pain, frequency, hematuria and urgency. Negative for vaginal discharge.  All other systems reviewed and are negative.    Physical Exam Updated Vital Signs BP (!) 142/99   Pulse 70   Temp 98 F (36.7 C)   Resp (!) 22   SpO2 98%   Physical Exam  Constitutional: She appears well-developed and well-nourished. No distress.  HENT:  Head: Normocephalic and atraumatic.  Eyes: Conjunctivae are normal. Right eye exhibits no discharge. Left eye exhibits no discharge.  Neck: No JVD present. No tracheal deviation present.  Cardiovascular: Normal rate, regular rhythm, normal heart sounds and intact distal pulses.  Pulmonary/Chest: Effort normal and breath sounds normal.  Abdominal: Soft. Bowel sounds are normal. She exhibits  no distension. There is no tenderness. There is no guarding.  Mild pressure sensation on palpation of the abdomen but no pain per the patient.  No CVA tenderness noted.  Murphy sign absent, Rovsing's absent.  Genitourinary:  Genitourinary Comments: Examination performed in the presence of chaperone.  No masses or lesions noted to the external genitalia.  Some atrophic changes noted to the vagina and cervix but no bleeding, abnormal vaginal discharge, or friability of the cervix.  There is no cervical motion tenderness or adnexal tenderness.  Musculoskeletal: She exhibits no edema.  No midline spine TTP, no paraspinal muscle tenderness, no deformity, crepitus, or step-off noted   Neurological: She is alert.  Skin: Skin is warm and dry. No erythema.  Psychiatric: She has a normal mood and affect. Her behavior is normal.  Nursing note and vitals reviewed.    ED Treatments / Results  Labs (all labs ordered are listed, but only abnormal results are displayed) Labs Reviewed  WET PREP, GENITAL - Abnormal; Notable for the following components:      Result Value    WBC, Wet Prep HPF POC MODERATE (*)    All other components within normal limits  URINALYSIS, ROUTINE W REFLEX MICROSCOPIC - Abnormal; Notable for the following components:   Color, Urine AMBER (*)    APPearance CLOUDY (*)    Hgb urine dipstick LARGE (*)    Protein, ur >=300 (*)    Leukocytes, UA MODERATE (*)    RBC / HPF >50 (*)    WBC, UA >50 (*)    Bacteria, UA MANY (*)    All other components within normal limits  CBC - Abnormal; Notable for the following components:   WBC 11.7 (*)    All other components within normal limits  COMPREHENSIVE METABOLIC PANEL - Abnormal; Notable for the following components:   Glucose, Bld 116 (*)    BUN 22 (*)    Creatinine, Ser 1.43 (*)    GFR calc non Af Amer 43 (*)    GFR calc Af Amer 50 (*)    All other components within normal limits  I-STAT CHEM 8, ED - Abnormal; Notable for the following components:   Potassium 3.4 (*)    BUN 23 (*)    Creatinine, Ser 1.30 (*)    Glucose, Bld 114 (*)    All other components within normal limits  URINE CULTURE  POC URINE PREG, ED  I-STAT BETA HCG BLOOD, ED ([facility], [facility], [facility] ONLY)  GC/CHLAMYDIA PROBE AMP ([place]) NOT AT [facility]    EKG None  Radiology Ct Renal Stone Study  Result Date: 05/30/2017 CLINICAL DATA:  Flank pain. EXAM: CT ABDOMEN AND PELVIS WITHOUT CONTRAST TECHNIQUE: Multidetector CT imaging of the abdomen and pelvis was performed following the standard protocol without IV contrast. COMPARISON:  None. FINDINGS: Lower chest: No acute abnormality. Hepatobiliary: No focal liver abnormality is seen. No gallstones, gallbladder wall thickening, or biliary dilatation. Pancreas: Unremarkable. No pancreatic ductal dilatation or surrounding inflammatory changes. Spleen: Normal in size without focal abnormality. Adrenals/Urinary Tract: Adrenal glands appear normal. Left kidney and ureter are unremarkable. Severe right renal atrophy is noted with exophytic cyst. No hydronephrosis or renal obstruction is  noted. No renal or ureteral calculi are noted. Urinary bladder is unremarkable. Stomach/Bowel: Stomach is within normal limits. Appendix appears normal. No evidence of bowel wall thickening, distention, or inflammatory changes. Vascular/Lymphatic: No significant vascular findings are present. No enlarged abdominal or pelvic lymph nodes. Reproductive: Calcified uterine fibroid is noted.  No adnexal abnormality is noted. Other: No abdominal wall hernia or abnormality. No abdominopelvic ascites. Musculoskeletal: No acute or significant osseous findings. IMPRESSION: Severe right renal atrophy with exophytic renal cyst. No hydronephrosis or renal obstruction is noted. Calcified uterine fibroid. No other abnormality seen in the abdomen or pelvis. Electronically Signed   By: Marijo Conception, M.D.   On: 05/30/2017 18:48    Procedures Procedures (including critical care time)  Medications Ordered in ED Medications  sodium chloride 0.9 % bolus 1,000 mL (0 mLs Intravenous Stopped 05/30/17 1909)  ketorolac (TORADOL) 30 MG/ML injection 30 mg (30 mg Intravenous Given 05/30/17 1648)  ondansetron (ZOFRAN) injection 4 mg (4 mg Intravenous Given 05/30/17 1648)  cephALEXin (KEFLEX) capsule 500 mg (500 mg Oral Given 05/30/17 1940)     Initial Impression / Assessment and Plan / ED Course  I have reviewed the triage vital signs and the nursing notes.  Pertinent labs & imaging results that were available during my care of the patient were reviewed by me and considered in my medical decision making (see chart for details).     Patient presents with urinary symptoms which began this morning as well as intermittent right-sided low back pain.  She is afebrile, hypertensive while in the ED with improvement.  She has not taken her blood pressure medicines today and declines taking them in the ED.  No signs of hypertensive urgency or emergency.  Abdomen is nontender on examination although she has a small amount of suprapubic  pressure on palpation; she has no CVA tenderness.  Lab work reviewed by me significant for mild nonspecific leukocytosis, creatinine at patient's baseline per chart review.  No significant electrolyte abnormalities.  Pelvic examination is on concerning for PID, TOA, or ovarian torsion.  Pregnancy test is negative.  There is no vaginal bleeding on examination.  UA is concerning for UTI versus nephrolithiasis.  We will send for culture.  Will obtain CT renal stone study for further characterization and evaluation.  CT renal stone study shows a severely atrophic right kidney with exophytic renal cyst no evidence of hydronephrosis or renal obstruction.  The patient was given fluids a small amount of Toradol, and nausea medicine.  On reevaluation she is resting comfortably in bed and states she is feeling much better.  Serial abdominal examinations remain benign.  I informed the patient of her CT scan results as well as her lab work.  She does have a nephrologist with whom she follows up with on a regular basis.  I encouraged her to follow-up with her nephrologist this week.  There is no evidence of obstruction, perforation, appendicitis, colitis, TOA, ovarian torsion, or other acute surgical abdominal pathology.  Work-up is consistent with a hemorrhagic cystitis.  Will discharge with Keflex and Zofran.  Discussed strict ED return precautions.  Advised patient to avoid ibuprofen. Pt and patient's mother verbalized understanding of and agreement with plan and patient is safe for discharge home at this time.   Final Clinical Impressions(s) / ED Diagnoses   Final diagnoses:  Acute cystitis with hematuria  Hypertension, unspecified type    ED Discharge Orders        Ordered    cephALEXin (KEFLEX) 500 MG capsule  4 times daily     05/30/17 1928    ondansetron (ZOFRAN) 4 MG tablet  Every 8 hours PRN     05/30/17 1928       Debroah Baller 05/30/17 2035    Isla Pence, MD 05/30/17  2243  

## 2017-05-30 NOTE — ED Notes (Signed)
Date and time results received: 05/30/17 7:11 PM  Test: Wet prep Critical Value: few WBC found. Corrected report coming  Name of Provider Notified: [place], [place]  Orders Received? Or Actions Taken?: Awaiting orders

## 2017-05-30 NOTE — ED Triage Notes (Signed)
Pt c/o lower back pain on right side and felt crampy and when using bathroom while ago thought her cycle was starting. But every time she urinates its bright red blood.

## 2017-05-30 NOTE — ED Notes (Signed)
Urine sample with culture sample has been sent to the lab from mini lab at this time.

## 2017-05-31 LAB — GC/CHLAMYDIA PROBE AMP (~~LOC~~) NOT AT ARMC
Chlamydia: NEGATIVE
Neisseria Gonorrhea: NEGATIVE

## 2017-06-01 LAB — URINE CULTURE

## 2017-12-24 ENCOUNTER — Emergency Department (HOSPITAL_COMMUNITY): Payer: Medicaid Other

## 2017-12-24 ENCOUNTER — Emergency Department (HOSPITAL_COMMUNITY)
Admission: EM | Admit: 2017-12-24 | Discharge: 2017-12-24 | Disposition: A | Discharge status: Home / Self Care | Payer: Medicaid Other | Attending: Emergency Medicine | Admitting: Emergency Medicine

## 2017-12-24 ENCOUNTER — Encounter (HOSPITAL_COMMUNITY): Payer: Self-pay

## 2017-12-24 DIAGNOSIS — Z79899 Other long term (current) drug therapy: Secondary | ICD-10-CM | POA: Insufficient documentation

## 2017-12-24 DIAGNOSIS — I10 Essential (primary) hypertension: Secondary | ICD-10-CM | POA: Insufficient documentation

## 2017-12-24 DIAGNOSIS — N3001 Acute cystitis with hematuria: Secondary | ICD-10-CM | POA: Insufficient documentation

## 2017-12-24 DIAGNOSIS — R350 Frequency of micturition: Secondary | ICD-10-CM | POA: Diagnosis present

## 2017-12-24 LAB — URINALYSIS, MICROSCOPIC (REFLEX): RBC / HPF: >50 RBC/hpf (ref 0–5)

## 2017-12-24 LAB — I-STAT BETA HCG BLOOD, ED (MC, WL, AP ONLY)

## 2017-12-24 LAB — CBC
HCT: 41.6 % (ref 36.0–46.0)
HEMOGLOBIN: 13.1 g/dL (ref 12.0–15.0)
MCH: 28.1 pg (ref 26.0–34.0)
MCHC: 31.5 g/dL (ref 30.0–36.0)
MCV: 89.3 fL (ref 80.0–100.0)
NRBC: 0 % (ref 0.0–0.2)
PLATELETS: 293 10*3/uL (ref 150–400)
RBC: 4.66 MIL/uL (ref 3.87–5.11)
RDW: 13.2 % (ref 11.5–15.5)
WBC: 11.9 10*3/uL — ABNORMAL HIGH (ref 4.0–10.5)

## 2017-12-24 LAB — COMPREHENSIVE METABOLIC PANEL
ALK PHOS: 70 U/L (ref 38–126)
ALT: 18 U/L (ref 0–44)
ANION GAP: 9 (ref 5–15)
AST: 16 U/L (ref 15–41)
Albumin: 3.9 g/dL (ref 3.5–5.0)
BUN: 31 mg/dL — AB (ref 6–20)
CALCIUM: 8.6 mg/dL — AB (ref 8.9–10.3)
CO2: 23 mmol/L (ref 22–32)
Chloride: 104 mmol/L (ref 98–111)
Creatinine, Ser: 1.46 mg/dL — ABNORMAL HIGH (ref 0.44–1.00)
GFR calc Af Amer: 49 mL/min — ABNORMAL LOW (ref >60)
GFR calc non Af Amer: 42 mL/min — ABNORMAL LOW (ref >60)
GLUCOSE: 111 mg/dL — AB (ref 70–99)
Potassium: 4.3 mmol/L (ref 3.5–5.1)
Sodium: 136 mmol/L (ref 135–145)
TOTAL PROTEIN: 7.5 g/dL (ref 6.5–8.1)
Total Bilirubin: 0.4 mg/dL (ref 0.3–1.2)

## 2017-12-24 LAB — URINALYSIS, ROUTINE W REFLEX MICROSCOPIC
BILIRUBIN URINE: NEGATIVE
Glucose, UA: NEGATIVE mg/dL
Ketones, ur: NEGATIVE mg/dL
Nitrite: NEGATIVE
PH: 6 (ref 5.0–8.0)
Protein, ur: 100 mg/dL — AB
SPECIFIC GRAVITY, URINE: 1.003 — AB (ref 1.005–1.030)

## 2017-12-24 MED ORDER — PHENAZOPYRIDINE HCL 200 MG PO TABS
200.0000 mg | ORAL_TABLET | Freq: Once | ORAL | Status: AC
  Administered 2017-12-24: 200 mg via ORAL
  Filled 2017-12-24: qty 1

## 2017-12-24 MED ORDER — DIAZEPAM 5 MG PO TABS: 5.0000 mg | ORAL_TABLET | Freq: Two times a day (BID) | ORAL | 0 refills | Status: DC

## 2017-12-24 MED ORDER — CEPHALEXIN 500 MG PO CAPS: 500.0000 mg | ORAL_CAPSULE | Freq: Two times a day (BID) | ORAL | 0 refills | Status: AC

## 2017-12-24 MED ORDER — ONDANSETRON HCL 4 MG/2ML IJ SOLN
4.0000 mg | Freq: Once | INTRAMUSCULAR | Status: AC
  Administered 2017-12-24: 4 mg via INTRAVENOUS
  Filled 2017-12-24: qty 2

## 2017-12-24 MED ORDER — DIAZEPAM 5 MG/ML IJ SOLN
5.0000 mg | Freq: Once | INTRAMUSCULAR | Status: AC
  Administered 2017-12-24: 5 mg via INTRAVENOUS
  Filled 2017-12-24: qty 2

## 2017-12-24 MED ORDER — CEPHALEXIN 500 MG PO CAPS
500.0000 mg | ORAL_CAPSULE | Freq: Once | ORAL | Status: AC
  Administered 2017-12-24: 500 mg via ORAL
  Filled 2017-12-24: qty 1

## 2017-12-24 MED ORDER — PHENAZOPYRIDINE HCL 200 MG PO TABS: 200.0000 mg | ORAL_TABLET | Freq: Three times a day (TID) | ORAL | 0 refills | Status: DC

## 2017-12-24 MED ORDER — SPIRONOLACTONE 25 MG PO TABS
25.0000 mg | ORAL_TABLET | Freq: Every day | ORAL | Status: DC
  Administered 2017-12-24: 25 mg via ORAL
  Filled 2017-12-24: qty 1

## 2017-12-24 NOTE — ED Triage Notes (Signed)
Patient here from home with complaints of frequent urination x3 days. Also reports painful urination. Patient went to bathroom 6 times while in triage.

## 2017-12-24 NOTE — ED Triage Notes (Signed)
Patient c/o urinary frequency and dysuria.

## 2017-12-24 NOTE — Discharge Instructions (Signed)
Thank you for allowing me to care for you today in the Emergency Department.   Please follow-up with your primary care provider for a recheck and a repeat urinalysis in 3 to 4 days.  Your first dose of Keflex has been given tonight in the emergency department.  Your urine has been sent for culture to the lab.  Take 1 tablet of Keflex by mouth 2 times daily for the next 5 days.  Make sure to take your next dose in the morning.  Make sure to take all of this course of antibiotics even if your symptoms improve.  You can take 650 mg of Tylenol every 6 hours to help with pain or fever.  Avoid taking ibuprofen or other NSAIDs because of her kidney function.  You can take 1 tablet of Pyridium every 8 hours as needed with meals to help with bladder pain and spasms. For severe, uncontrollable pain or spasms, take one tablet of Valium 2 times daily.  This medication is a benzodiazepine and can make you drowsy so do not take it before you work or drive.  Do not take any Ativan while you are taking this medication because they are in the same class of medications.  You should not take another tablet of this medication until tomorrow since you are given a dose tonight in the emergency department.  Return to the emergency department if you develop high fever despite taking Tylenol as described above, worsening blood in your urine, shortness of breath, uncontrollable pain, or other new, or concerning symptoms.

## 2017-12-24 NOTE — ED Provider Notes (Signed)
[facility] DEPT Provider Note   CSN: [id number] Arrival date & time: 12/24/17  1322     History   Chief Complaint Chief Complaint  Patient presents with  . Urinary Frequency  . Dysuria    HPI Tina Richardson is a 47 y.o. female with a history of anxiety, hypertension, and uterine fibroids who presents to the emergency department with a chief complaint of dysuria.  The patient reports she developed intermittent dysuria she describes as "feeling as if my bladder is involuntarily straining or pushing" this afternoon along with urinary frequency. She dates she felt as if she was developing a bladder infection so she started drinking water and cranberry juice.  She initially had some minimal improvement.  She also attempted to alleviate her symptoms with ginger, with no improvement. The dysuria became constant and significantly worsened prior to arrival.  She reports associated chills suprapubic pain, and hematuria.  She denies fever, vaginal pain, bleeding, or discharge, constipation, diarrhea, chest pain, dyspnea, or urinary retention.  She is sexually active with one female partner.  The history is provided by the patient. No language interpreter was used.    Past Medical History:  Diagnosis Date  . Anxiety   . Gestational diabetes    diet controlled per patient  . Heart palpitations   . High-risk pregnancy   . Hypertension   . Multiparity     Patient Active Problem List   Diagnosis Date Noted  . Failure to progress in labor 02/10/2012  . Cesarean delivery delivered 02/10/2012  . S/P cesarean section 02/06/2012  . GBS (group B streptococcus) UTI complicating pregnancy 85/63/1497  . LGA (large for gestational age) fetus affecting mother, antepartum 01/23/2012  . Pregnancy, supervision for, high-risk 01/23/2012  . Polyhydramnios, antepartum 01/03/2012  . Gestational Diabetes 10/10/2011  . AMA (advanced maternal age) multigravida 35+ 08/31/2011   . [place] multipara 08/31/2011  . Blood pressure elevated 08/31/2011  . Obesity complicating pregnancy 02/63/7858  . Fibroids 08/31/2011  . BV (bacterial vaginosis) 08/31/2011    Past Surgical History:  Procedure Laterality Date  . CESAREAN SECTION  02/06/2012   Procedure: CESAREAN SECTION;  Surgeon: Osborne Oman, MD;  Location: [facility];  Service: Obstetrics;  Laterality: N/A;  Primary Cesarean Section Delivery Baby Boy @ 0000, Apgars 8/9   . NO PAST SURGERIES       OB History    Gravida  8   Para  8   Term  8   Preterm      AB      Living  8     SAB      TAB      Ectopic      Multiple      Live Births  2            Home Medications    Prior to Admission medications   Medication Sig Start Date End Date Taking? Authorizing Provider  Methenamine-Sodium Salicylate (CYSTEX) 850-277.4 MG TABS Take 2 tablets by mouth daily as needed (urinary pain).   Yes [provider]  cephALEXin (KEFLEX) 500 MG capsule Take 1 capsule (500 mg total) by mouth 2 (two) times daily for 5 days. 12/24/17 12/29/17  Adisynn Suleiman A, PA-C  diazepam (VALIUM) 5 MG tablet Take 1 tablet (5 mg total) by mouth 2 (two) times daily. 12/24/17   Sophiarose Eades A, PA-C  lisinopril-hydrochlorothiazide (PRINZIDE,ZESTORETIC) 20-12.5 MG tablet Take 0.5 tablets by mouth every evening.    [provider]  LORazepam (ATIVAN) 0.5 MG tablet TK 1 T PO BID PRN ANXIETY 05/25/17   [provider]  ondansetron (ZOFRAN) 4 MG tablet Take 1 tablet (4 mg total) by mouth every 8 (eight) hours as needed for nausea or vomiting. 05/30/17   Fawze, Mina A, PA-C  phenazopyridine (PYRIDIUM) 200 MG tablet Take 1 tablet (200 mg total) by mouth 3 (three) times daily with meals. 12/24/17   Daylin Gruszka A, PA-C  spironolactone (ALDACTONE) 25 MG tablet Take 25 mg by mouth 2 (two) times daily.  06/12/16 06/12/17  [provider]    Family History Family History  Problem Relation Age of Onset  .  Diabetes Mother   . Hypertension Father   . Diabetes Father   . COPD Father   . Asthma Father   . Other Neg Hx     Social History Social History   Tobacco Use  . Smoking status: Never Smoker  . Smokeless tobacco: Never Used  Substance Use Topics  . Alcohol use: Yes    Comment: occasionally  . Drug use: No     Allergies   Dilaudid [hydromorphone hcl] and Norvasc [amlodipine besylate]   Review of Systems Review of Systems  Constitutional: Positive for chills. Negative for activity change and fever.  Respiratory: Negative for shortness of breath.   Cardiovascular: Negative for chest pain.  Gastrointestinal: Positive for abdominal pain and nausea. Negative for constipation, diarrhea and vomiting.  Genitourinary: Positive for dysuria.  Musculoskeletal: Negative for back pain.  Skin: Negative for rash.  Allergic/Immunologic: Negative for immunocompromised state.  Neurological: Negative for headaches.  Psychiatric/Behavioral: Negative for confusion.     Physical Exam Updated Vital Signs BP (!) 152/90   Pulse 85   Temp 97.6 F (36.4 C) (Oral)   Resp 16   Ht 5' 6.5" (1.689 m)   Wt 108.9 kg   SpO2 96%   BMI 38.16 kg/m   Physical Exam  Constitutional: No distress.  HENT:  Head: Normocephalic.  Eyes: Conjunctivae are normal.  Neck: Neck supple.  Cardiovascular: Normal rate and regular rhythm. Exam reveals no gallop and no friction rub.  No murmur heard. Pulmonary/Chest: Effort normal. No stridor. No respiratory distress. She has no wheezes. She has no rales. She exhibits no tenderness.  Abdominal: Soft. She exhibits no distension and no mass. There is tenderness. There is no rebound and no guarding. No hernia.  Focally tender to palpation in the suprapubic region.  Abdomen is soft, nondistended.  No rebound or guarding.  No CVA tenderness bilaterally.  No tenderness over McBurney's point.  Neurological: She is alert.  Skin: Skin is warm. No rash noted.    Psychiatric: Her behavior is normal.  Nursing note and vitals reviewed.    ED Treatments / Results  Labs (all labs ordered are listed, but only abnormal results are displayed) Labs Reviewed  URINALYSIS, ROUTINE W REFLEX MICROSCOPIC - Abnormal; Notable for the following components:      Result Value   Color, Urine RED (*)    APPearance HAZY (*)    Specific Gravity, Urine 1.003 (*)    Hgb urine dipstick LARGE (*)    Protein, ur 100 (*)    Leukocytes, UA MODERATE (*)    All other components within normal limits  CBC - Abnormal; Notable for the following components:   WBC 11.9 (*)    All other components within normal limits  COMPREHENSIVE METABOLIC PANEL - Abnormal; Notable for the following components:   Glucose, Bld 111 (*)  BUN 31 (*)    Creatinine, Ser 1.46 (*)    Calcium 8.6 (*)    GFR calc non Af Amer 42 (*)    GFR calc Af Amer 49 (*)    All other components within normal limits  URINALYSIS, MICROSCOPIC (REFLEX) - Abnormal; Notable for the following components:   Bacteria, UA FEW (*)    All other components within normal limits  URINE CULTURE  I-STAT BETA HCG BLOOD, ED ([facility], [facility], [facility] ONLY)    EKG None  Radiology US Renal  Result Date: 12/24/2017 CLINICAL DATA:  Hematuria EXAM: RENAL / URINARY TRACT ULTRASOUND COMPLETE COMPARISON:  05/30/2017 CT abdomen/pelvis FINDINGS: Right Kidney: Renal measurements: 8.0 x 2.6 x 3.5 cm = volume: 39 mL. Echogenic atrophic right kidney. No right hydronephrosis. Simple exophytic 1.9 cm lower right renal cyst. Left Kidney: Renal measurements: 11.6 x 5.9 x 5.0 cm = volume: 180 mL. Echogenic normal size left kidney. No left hydronephrosis. No left renal mass. Bladder: Mild diffuse bladder wall thickening is not substantially changed from prior CT. IMPRESSION: 1. Chronic severe right renal atrophy.  Normal size left kidney. 2. Kidneys are echogenic, suggesting nonspecific renal parenchymal disease of uncertain chronicity. 3. No  hydronephrosis. 4. Nonspecific mild diffuse bladder wall thickening, not substantially changed from prior CT. Consider correlation with urinalysis to exclude acute cystitis. Electronically Signed   By: Ilona Sorrel M.D.   On: 12/24/2017 18:43    Procedures Procedures (including critical care time)  Medications Ordered in ED Medications  ondansetron (ZOFRAN) injection 4 mg (4 mg Intravenous Given 12/24/17 1659)  phenazopyridine (PYRIDIUM) tablet 200 mg (200 mg Oral Given 12/24/17 1659)  diazepam (VALIUM) injection 5 mg (5 mg Intravenous Given 12/24/17 1655)  cephALEXin (KEFLEX) capsule 500 mg (500 mg Oral Given 12/24/17 1947)     Initial Impression / Assessment and Plan / ED Course  I have reviewed the triage vital signs and the nursing notes.  Pertinent labs & imaging results that were available during my care of the patient were reviewed by me and considered in my medical decision making (see chart for details).     47 year old female with a history of anxiety, hypertension, and uterine fibroids senting with urinary frequency, hematuria, chills, and suprapubic abdominal pain, onset today.  The first 2 times that I went to examine the patient, she was in the restroom.  Per triage note, the patient voided 6 times while she was in triage.  She also describes what she is having his bladder spasms.  Will give Pyridium, but based on how uncomfortable the patient looks, we will also attempt to give the patient an injection of Valium to see if it will improve spasms of smooth muscle.  On reevaluation, the patient is markedly more comfortable.  Blood pressure has improved from 176/116 to 146/95.  She reports that her urinary frequency has also significantly improved.  Is feeling much better.  UA with moderate leukocyte esterase, large hemoglobinuria, gross hematuria, and mild proteinuria.  She has a mild leukocytosis of 12.  Protein is stable from previous at 1.46.  Given hematuria, renal ultrasound  has been ordered to ensure the patient does not have an obstructive uropathy.  Renal ultrasound is consistent with cystitis.  Urine culture from March 2016 was reviewed, which was positive for pansensitive E. Coli.  Patient's creatinine clearance, calculated by me, was 84 so I will discharge the patient with 500 mg of Keflex twice daily.  Urine culture has been sent.  First dose  of Keflex given in the ED.  Will also discharge with a short course of oral Valium and Pyridium. A 66-month prescription history query was performed using the [place] CSRS prior to discharge.  Strict return precautions given.  The patient was hemodynamically stable prior to discharge and in no acute distress.  She is safe for outpatient follow-up at this time.   Final Clinical Impressions(s) / ED Diagnoses   Final diagnoses:  Acute cystitis with hematuria    ED Discharge Orders         Ordered    phenazopyridine (PYRIDIUM) 200 MG tablet  3 times daily with meals     12/24/17 1916    cephALEXin (KEFLEX) 500 MG capsule  2 times daily     12/24/17 1916    diazepam (VALIUM) 5 MG tablet  2 times daily     12/24/17 1916           McDonaldLaymond Purser, PA-C 12/25/17 0223    Maudie Flakes, MD 12/26/17 1055

## 2017-12-27 LAB — URINE CULTURE
Culture: 40000 — AB
Special Requests: NORMAL

## 2017-12-28 ENCOUNTER — Telehealth: Payer: Self-pay | Admitting: *Deleted

## 2017-12-28 NOTE — Telephone Encounter (Signed)
Post ED Visit - Positive Culture Follow-up  Culture report reviewed by antimicrobial stewardship pharmacist:  []  Elenor Quinones, Pharm.D. []  Heide Guile, Pharm.D., BCPS AQ-ID []  Parks Neptune, Pharm.D., BCPS []  Alycia Rossetti, Pharm.D., BCPS []  Hermann, Pharm.D., BCPS, AAHIVP []  Legrand Como, Pharm.D., BCPS, AAHIVP []  Salome Arnt, PharmD, BCPS []  Johnnette Gourd, PharmD, BCPS [x]  Hughes Better, PharmD, BCPS []  Leeroy Cha, PharmD  Positive urine culture Treated with Cephalexin, organism sensitive to the same and no further patient follow-up is required at this time.  Johnella, [facility] 12/28/2017, 10:29 AM

## 2019-06-19 ENCOUNTER — Encounter: Payer: Self-pay | Admitting: Podiatry

## 2019-06-19 ENCOUNTER — Ambulatory Visit (INDEPENDENT_AMBULATORY_CARE_PROVIDER_SITE_OTHER): Payer: Managed Care, Other (non HMO)

## 2019-06-19 ENCOUNTER — Other Ambulatory Visit: Payer: Self-pay | Admitting: Podiatry

## 2019-06-19 ENCOUNTER — Other Ambulatory Visit: Payer: Self-pay

## 2019-06-19 ENCOUNTER — Ambulatory Visit: Payer: Medicaid Other | Admitting: Podiatry

## 2019-06-19 DIAGNOSIS — M722 Plantar fascial fibromatosis: Secondary | ICD-10-CM

## 2019-06-19 DIAGNOSIS — M7672 Peroneal tendinitis, left leg: Secondary | ICD-10-CM | POA: Diagnosis not present

## 2019-06-19 DIAGNOSIS — M79672 Pain in left foot: Secondary | ICD-10-CM

## 2019-06-19 DIAGNOSIS — N289 Disorder of kidney and ureter, unspecified: Secondary | ICD-10-CM | POA: Insufficient documentation

## 2019-06-19 DIAGNOSIS — M79671 Pain in right foot: Secondary | ICD-10-CM

## 2019-06-19 DIAGNOSIS — I10 Essential (primary) hypertension: Secondary | ICD-10-CM | POA: Insufficient documentation

## 2019-06-19 DIAGNOSIS — O24419 Gestational diabetes mellitus in pregnancy, unspecified control: Secondary | ICD-10-CM | POA: Insufficient documentation

## 2019-06-19 MED ORDER — MELOXICAM 15 MG PO TABS: 15.0000 mg | ORAL_TABLET | Freq: Every day | ORAL | 2 refills | Status: DC

## 2019-06-19 NOTE — Patient Instructions (Signed)

## 2019-06-19 NOTE — Progress Notes (Signed)
Subjective:   Patient ID: Tina Richardson, female   DOB: 49 y.o.   MRN: [id number]   HPI Patient states she developed intense pain on the outside of her left foot and she does have chronic discomfort in both feet and has had history of inserts but she feels they gave her corns.  States the outside left foot is been very sore and patient does not smoke likes to be active and its been present for several months   Review of Systems  All other systems reviewed and are negative.       Objective:  Physical Exam Vitals and nursing note reviewed.  Constitutional:      Appearance: She is well-developed.  Pulmonary:     Effort: Pulmonary effort is normal.  Musculoskeletal:        General: Normal range of motion.  Skin:    General: Skin is warm.  Neurological:     Mental Status: She is alert.     Neurovascular status found to be intact muscle strength was found to be adequate range of motion within normal limits.  Patient is found to have intense discomfort peroneal insertion left with inflammation fluid at its insertion into the base of the fifth metatarsal with no indication of muscle strength loss.  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  Gave instructions on physical therapy anti-inflammatories ice and patient will be seen back to recheck  X-rays indicate there is some reactivity base of fifth metatarsal with bone spur formation bilateral

## 2019-07-05 ENCOUNTER — Ambulatory Visit: Payer: Managed Care, Other (non HMO) | Admitting: Podiatry

## 2019-07-05 ENCOUNTER — Encounter: Payer: Self-pay | Admitting: Podiatry

## 2019-07-05 ENCOUNTER — Other Ambulatory Visit: Payer: Self-pay

## 2019-07-05 DIAGNOSIS — M7672 Peroneal tendinitis, left leg: Secondary | ICD-10-CM

## 2019-07-07 NOTE — Progress Notes (Signed)
Subjective:   Patient ID: Tina Richardson, female   DOB: 49 y.o.   MRN: [id number]   HPI Patient states she is doing so much better and she is very happy that she can walk without pain.  States that this has been a long going issue for her   ROS      Objective:  Physical Exam  Neurovascular status intact with significant diminishment of discomfort bilateral     Assessment:  2 doing well after having tendinitis fasciitis-like condition bilateral good     Plan:  I went ahead and I reviewed this condition at great length we discussed this may recur and we discussed different treatment options and at this point continue with supportive shoes and inflammatories and topical medication.  Patient will be seen back if symptoms indicate may require more aggressive treatment or possible orthotics

## 2019-08-28 ENCOUNTER — Ambulatory Visit (INDEPENDENT_AMBULATORY_CARE_PROVIDER_SITE_OTHER): Payer: Managed Care, Other (non HMO) | Admitting: Physician Assistant

## 2019-08-28 ENCOUNTER — Other Ambulatory Visit: Payer: Self-pay

## 2019-08-28 ENCOUNTER — Encounter: Payer: Self-pay | Admitting: Physician Assistant

## 2019-08-28 VITALS — BP 220/138 | HR 95 | Temp 97.7°F | Ht 66.2 in | Wt 252.2 lb

## 2019-08-28 DIAGNOSIS — I10 Essential (primary) hypertension: Secondary | ICD-10-CM | POA: Diagnosis not present

## 2019-08-28 DIAGNOSIS — O99810 Abnormal glucose complicating pregnancy: Secondary | ICD-10-CM | POA: Diagnosis not present

## 2019-08-28 DIAGNOSIS — Z136 Encounter for screening for cardiovascular disorders: Secondary | ICD-10-CM

## 2019-08-28 DIAGNOSIS — Z13220 Encounter for screening for lipoid disorders: Secondary | ICD-10-CM

## 2019-08-28 MED ORDER — SPIRONOLACTONE 25 MG PO TABS: 25.0000 mg | ORAL_TABLET | Freq: Two times a day (BID) | ORAL | 1 refills | Status: DC

## 2019-08-28 NOTE — Progress Notes (Signed)
Tina Richardson is a 49 y.o. female is here to establish care.  I acted as a Education administrator for [employer], PA-C [facility], [place]  History of Present Illness:   Chief Complaint  Patient presents with  . New Patient (Initial Visit)    HPI   Establish Care Pt is here to establish care.   Hypertension Pt checks bp at home. Readings are >140/90.  She was previously on Spironolactone 25 mg BID along with Amlodipine. Amlodipine has caused significant swelling in her lower legs. Lisinopril caused slight cough. She sees Dr. Moshe Cipro with [facility] but hasn't seen since the beginning of the pandemic. Pt has not taken any medication for blood pressure since June. Pt has visual changes at times. Denies HA, SOB, or chest pain.   BP Readings from Last 3 Encounters:  08/28/19 (!) 220/138  12/24/17 (!) 152/90  05/30/17 (!) 142/99   Gestational diabetes Has history of this. Last A1c I have on record is from 2017. Will recheck today. Denies concerns for low blood sugar.  Lab Results  Component Value Date   HGBA1C 6.1 (H) 06/15/2015     Health Maintenance Due  Topic Date Due  . Hepatitis C Screening  Never done  . TETANUS/TDAP  Never done  . PAP SMEAR-Modifier  08/31/2014  . INFLUENZA VACCINE  08/25/2019    Past Medical History:  Diagnosis Date  . Anxiety   . Gestational diabetes    diet controlled per patient  . High-risk pregnancy   . Hypertension   . Multiparity      Social History   Tobacco Use  . Smoking status: Never Smoker  . Smokeless tobacco: Never Used  Vaping Use  . Vaping Use: Never used  Substance Use Topics  . Alcohol use: Yes    Comment: occasionally  . Drug use: No    Past Surgical History:  Procedure Laterality Date  . CESAREAN SECTION  02/06/2012   Procedure: CESAREAN SECTION;  Surgeon: Osborne Oman, MD;  Location: [facility];  Service: Obstetrics;  Laterality: N/A;  Primary Cesarean Section Delivery Baby Boy @ 0000, Apgars 8/9   . NO  PAST SURGERIES      Family History  Problem Relation Age of Onset  . Diabetes Mother   . Hypertension Father   . Diabetes Father   . COPD Father   . Asthma Father   . Breast cancer Paternal Grandmother   . Other Neg Hx     PMHx, SurgHx, SocialHx, FamHx, Medications, and Allergies were reviewed in the Visit Navigator and updated as appropriate.   Patient Active Problem List   Diagnosis Date Noted  . Stage 3 chronic kidney disease 06/12/2016  . Abnormal EKG 06/11/2016  . Malignant hypertension 06/11/2016  . Gestational Diabetes 10/10/2011  . BV (bacterial vaginosis) 08/31/2011    Social History   Tobacco Use  . Smoking status: Never Smoker  . Smokeless tobacco: Never Used  Vaping Use  . Vaping Use: Never used  Substance Use Topics  . Alcohol use: Yes    Comment: occasionally  . Drug use: No    Current Medications and Allergies:    Current Outpatient Medications:  .  amLODipine (NORVASC) 5 MG tablet, Take 5 mg by mouth daily., Disp: , Rfl:  .  spironolactone (ALDACTONE) 25 MG tablet, Take 1 tablet (25 mg total) by mouth 2 (two) times daily., Disp: 60 tablet, Rfl: 1 .  diazepam (VALIUM) 5 MG tablet, Take 1 tablet (5 mg total)  by mouth 2 (two) times daily. (Patient not taking: Reported on 08/28/2019), Disp: 10 tablet, Rfl: 0 .  LORazepam (ATIVAN) 0.5 MG tablet, TK 1 T PO BID PRN ANXIETY (Patient not taking: Reported on 08/28/2019), Disp: , Rfl: 0 .  phenazopyridine (PYRIDIUM) 200 MG tablet, Take 1 tablet (200 mg total) by mouth 3 (three) times daily with meals. (Patient not taking: Reported on 08/28/2019), Disp: 6 tablet, Rfl: 0   Allergies  Allergen Reactions  . Dilaudid [Hydromorphone Hcl] Other (See Comments)    Could not stop shaking  . Norvasc [Amlodipine Besylate] Swelling    Leg swelling    Review of Systems   ROS Negative unless otherwise specified per HPI.  Vitals:   Vitals:   08/28/19 0943  BP: (!) 220/138  Pulse: 95  Temp: 97.7 F (36.5 C)    TempSrc: Temporal  SpO2: 98%  Weight: 252 lb 3.2 oz (114.4 kg)  Height: 5' 6.2" (1.681 m)     Body mass index is 40.46 kg/m.   Physical Exam:    Physical Exam Vitals and nursing note reviewed.  Constitutional:      General: She is not in acute distress.    Appearance: She is well-developed. She is not ill-appearing or toxic-appearing.  Cardiovascular:     Rate and Rhythm: Normal rate and regular rhythm.     Pulses: Normal pulses.     Heart sounds: Normal heart sounds, S1 normal and S2 normal.     Comments: No LE edema Pulmonary:     Effort: Pulmonary effort is normal.     Breath sounds: Normal breath sounds.  Skin:    General: Skin is warm and dry.  Neurological:     Mental Status: She is alert.     GCS: GCS eye subscore is 4. GCS verbal subscore is 5. GCS motor subscore is 6.  Psychiatric:        Speech: Speech normal.        Behavior: Behavior normal. Behavior is cooperative.      Assessment and Plan:    Keelia was seen today for new patient (initial visit).  Diagnoses and all orders for this visit:  Gestational Diabetes Update HgbA1c and determine treatment as needed. -     Hemoglobin A1c -     Urinalysis, Routine w reflex microscopic -     HgB A1c; Future -     CBC with Differential/Platelet; Future -     Urinalysis, Routine w reflex microscopic; Future  Malignant hypertension Will restart spironolactone. Update BMP. Will likely trial addition of Losartan, await BMP to decide. Follow-up with renal provider for further evaluation and management. Worsening precautions advised. -     CBC with Differential/Platelet -     Comprehensive metabolic panel -     Urinalysis, Routine w reflex microscopic -     CBC with Differential/Platelet; Future -     Comp Met (CMET); Future -     Urinalysis, Routine w reflex microscopic; Future  Encounter for lipid screening for cardiovascular disease -     Lipid panel -     Lipid Profile; Future -     Urinalysis,  Routine w reflex microscopic; Future  Other orders -     spironolactone (ALDACTONE) 25 MG tablet; Take 1 tablet (25 mg total) by mouth 2 (two) times daily.  . Reviewed expectations re: course of current medical issues. . Discussed self-management of symptoms. . Outlined signs and symptoms indicating need for more acute intervention. . Patient  verbalized understanding and all questions were answered. . See orders for this visit as documented in the electronic medical record. . Patient received an After Visit Summary.  CMA or LPN served as scribe during this visit. History, Physical, and Plan performed by medical provider. The above documentation has been reviewed and is accurate and complete.   Inda Coke, PA-C [place], [place] 08/28/2019  Follow-up: No follow-ups on file.

## 2019-08-28 NOTE — Patient Instructions (Addendum)
It was great to see you!  ---Please schedule a mammogram. ---Someone will be in touch with your colonoscopy report  We will get your labs done today and reach out to you soon.  Start spironolactone 25 mg two times daily.  Let's follow-up 1 month, sooner if you have concerns.  Take care,  Inda Coke PA-C

## 2019-08-29 ENCOUNTER — Telehealth: Payer: Self-pay | Admitting: Physician Assistant

## 2019-08-29 ENCOUNTER — Encounter: Payer: Self-pay | Admitting: Physician Assistant

## 2019-08-29 ENCOUNTER — Other Ambulatory Visit: Payer: Self-pay | Admitting: Physician Assistant

## 2019-08-29 LAB — COMPREHENSIVE METABOLIC PANEL
AG Ratio: 1.5 (calc) (ref 1.0–2.5)
ALT: 27 U/L (ref 6–29)
AST: 19 U/L (ref 10–35)
Albumin: 4.3 g/dL (ref 3.6–5.1)
Alkaline phosphatase (APISO): 103 U/L (ref 31–125)
BUN/Creatinine Ratio: 13 (calc) (ref 6–22)
BUN: 18 mg/dL (ref 7–25)
CO2: 31 mmol/L (ref 20–32)
Calcium: 9.4 mg/dL (ref 8.6–10.2)
Chloride: 102 mmol/L (ref 98–110)
Creat: 1.4 mg/dL — ABNORMAL HIGH (ref 0.50–1.10)
Globulin: 2.8 g/dL (calc) (ref 1.9–3.7)
Glucose, Bld: 104 mg/dL — ABNORMAL HIGH (ref 65–99)
Potassium: 4 mmol/L (ref 3.5–5.3)
Sodium: 141 mmol/L (ref 135–146)
Total Bilirubin: 0.6 mg/dL (ref 0.2–1.2)
Total Protein: 7.1 g/dL (ref 6.1–8.1)

## 2019-08-29 LAB — URINALYSIS, ROUTINE W REFLEX MICROSCOPIC
Bilirubin Urine: NEGATIVE
Glucose, UA: NEGATIVE
Ketones, ur: NEGATIVE
Nitrite: NEGATIVE
Specific Gravity, Urine: 1.018 (ref 1.001–1.03)
WBC, UA: >=60 /HPF — AB (ref 0–5)
pH: 6.5 (ref 5.0–8.0)

## 2019-08-29 LAB — LIPID PANEL
Cholesterol: 266 mg/dL — ABNORMAL HIGH (ref <200)
HDL: 44 mg/dL — ABNORMAL LOW (ref >=50)
LDL Cholesterol (Calc): 183 mg/dL (calc) — ABNORMAL HIGH
Non-HDL Cholesterol (Calc): 222 mg/dL (calc) — ABNORMAL HIGH (ref <130)
Total CHOL/HDL Ratio: 6 (calc) — ABNORMAL HIGH (ref <5.0)
Triglycerides: 207 mg/dL — ABNORMAL HIGH (ref <150)

## 2019-08-29 LAB — CBC WITH DIFFERENTIAL/PLATELET
Absolute Monocytes: 403 cells/uL (ref 200–950)
Basophils Absolute: 30 cells/uL (ref 0–200)
Basophils Relative: 0.4 %
Eosinophils Absolute: 152 cells/uL (ref 15–500)
Eosinophils Relative: 2 %
HCT: 46.5 % — ABNORMAL HIGH (ref 35.0–45.0)
Hemoglobin: 14.9 g/dL (ref 11.7–15.5)
Lymphs Abs: 3078 cells/uL (ref 850–3900)
MCH: 26.8 pg — ABNORMAL LOW (ref 27.0–33.0)
MCHC: 32 g/dL (ref 32.0–36.0)
MCV: 83.5 fL (ref 80.0–100.0)
MPV: 10.2 fL (ref 7.5–12.5)
Monocytes Relative: 5.3 %
Neutro Abs: 3937 cells/uL (ref 1500–7800)
Neutrophils Relative %: 51.8 %
Platelets: 339 10*3/uL (ref 140–400)
RBC: 5.57 10*6/uL — ABNORMAL HIGH (ref 3.80–5.10)
RDW: 13.5 % (ref 11.0–15.0)
Total Lymphocyte: 40.5 %
WBC: 7.6 10*3/uL (ref 3.8–10.8)

## 2019-08-29 LAB — HEMOGLOBIN A1C
Hgb A1c MFr Bld: 6.2 % of total Hgb — ABNORMAL HIGH (ref <5.7)
Mean Plasma Glucose: 131 (calc)
eAG (mmol/L): 7.3 (calc)

## 2019-08-29 MED ORDER — ATORVASTATIN CALCIUM 20 MG PO TABS: 20.0000 mg | ORAL_TABLET | Freq: Every day | ORAL | 3 refills | Status: DC

## 2019-08-29 MED ORDER — LOSARTAN POTASSIUM 50 MG PO TABS: 50.0000 mg | ORAL_TABLET | Freq: Every day | ORAL | 0 refills | Status: DC

## 2019-08-29 NOTE — Telephone Encounter (Signed)
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 Pt verbalized understanding. See result notes for rest of conversation. Note sent.

## 2019-08-29 NOTE — Telephone Encounter (Signed)
Pt returned call about lab results. Pt has questions about her high cholesterol. Pt also asked if she could have a work note for yesterday for missing work due to her appointment. Pt also is concerned about her high BP and asked if she could have a note to stay out of work until it gets back to normal. Please advise.

## 2019-09-02 ENCOUNTER — Telehealth: Payer: Self-pay | Admitting: Physician Assistant

## 2019-09-02 MED ORDER — LISINOPRIL-HYDROCHLOROTHIAZIDE 20-12.5 MG PO TABS: 1.0000 | ORAL_TABLET | Freq: Every day | ORAL | 1 refills | Status: DC

## 2019-09-02 NOTE — Telephone Encounter (Signed)
Error

## 2019-09-02 NOTE — Telephone Encounter (Signed)
Please call patient.  Did she call Nephrology to get scheduled for an appointment? If so, when is it?  I have reviewed her most recent note from Nephrology last year and would like to change her blood pressure medication to be more in line with their recommendations.  Continue Spironolactone 25 mg BID Stop Losrtan 50 mg daily Start Lisinopril-Hct 20/12.5  Please be sure to follow-up with nephrology OR me within 1 month.

## 2019-09-03 NOTE — Telephone Encounter (Signed)
Pt has an appt this Thurs 8/12 with Dr. Clover Mealy at nephrology. Pt notified of medication changes.

## 2020-03-14 ENCOUNTER — Other Ambulatory Visit: Payer: Self-pay | Admitting: Physician Assistant

## 2020-03-17 ENCOUNTER — Telehealth: Payer: Self-pay

## 2020-03-17 NOTE — Telephone Encounter (Signed)
Paperwork was sent over from [facility] for patient to call back and schedule appt with [facility].

## 2020-03-30 ENCOUNTER — Encounter: Payer: Self-pay | Admitting: Physician Assistant

## 2020-03-30 ENCOUNTER — Other Ambulatory Visit: Payer: Self-pay | Admitting: Physician Assistant

## 2020-03-30 ENCOUNTER — Other Ambulatory Visit: Payer: Self-pay

## 2020-03-30 ENCOUNTER — Ambulatory Visit (INDEPENDENT_AMBULATORY_CARE_PROVIDER_SITE_OTHER): Payer: Managed Care, Other (non HMO) | Admitting: Physician Assistant

## 2020-03-30 VITALS — BP 126/80 | HR 92 | Temp 98.0°F | Ht 66.0 in | Wt 250.2 lb

## 2020-03-30 DIAGNOSIS — E669 Obesity, unspecified: Secondary | ICD-10-CM

## 2020-03-30 DIAGNOSIS — I10 Essential (primary) hypertension: Secondary | ICD-10-CM

## 2020-03-30 DIAGNOSIS — N183 Chronic kidney disease, stage 3 unspecified: Secondary | ICD-10-CM | POA: Diagnosis not present

## 2020-03-30 DIAGNOSIS — Z01818 Encounter for other preprocedural examination: Secondary | ICD-10-CM

## 2020-03-30 DIAGNOSIS — E785 Hyperlipidemia, unspecified: Secondary | ICD-10-CM | POA: Diagnosis not present

## 2020-03-30 LAB — LIPID PANEL
Cholesterol: 204 mg/dL — ABNORMAL HIGH (ref 0–200)
HDL: 37.4 mg/dL — ABNORMAL LOW (ref >39.00)
NonHDL: 166.9
Total CHOL/HDL Ratio: 5
Triglycerides: 206 mg/dL — ABNORMAL HIGH (ref 0.0–149.0)
VLDL: 41.2 mg/dL — ABNORMAL HIGH (ref 0.0–40.0)

## 2020-03-30 LAB — COMPREHENSIVE METABOLIC PANEL
ALT: 20 U/L (ref 0–35)
AST: 17 U/L (ref 0–37)
Albumin: 4.2 g/dL (ref 3.5–5.2)
Alkaline Phosphatase: 92 U/L (ref 39–117)
BUN: 30 mg/dL — ABNORMAL HIGH (ref 6–23)
CO2: 27 mEq/L (ref 19–32)
Calcium: 9.9 mg/dL (ref 8.4–10.5)
Chloride: 105 mEq/L (ref 96–112)
Creatinine, Ser: 1.53 mg/dL — ABNORMAL HIGH (ref 0.40–1.20)
GFR: 39.47 mL/min — ABNORMAL LOW (ref >60.00)
Glucose, Bld: 103 mg/dL — ABNORMAL HIGH (ref 70–99)
Potassium: 3.9 mEq/L (ref 3.5–5.1)
Sodium: 142 mEq/L (ref 135–145)
Total Bilirubin: 0.5 mg/dL (ref 0.2–1.2)
Total Protein: 7.2 g/dL (ref 6.0–8.3)

## 2020-03-30 LAB — LDL CHOLESTEROL, DIRECT: Direct LDL: 151 mg/dL

## 2020-03-30 MED ORDER — ATORVASTATIN CALCIUM 40 MG PO TABS: 40.0000 mg | ORAL_TABLET | Freq: Every day | ORAL | 3 refills | Status: DC

## 2020-03-30 NOTE — Progress Notes (Signed)
Tina Richardson is a 50 y.o. female is here to discuss: Medical clearance  I acted as a Education administrator for [employer], PA-C Anselmo Pickler, LPN   History of Present Illness:   Chief Complaint  Patient presents with  . Medical Clearance    HPI  Medical clearance Pt is here today for clearance for wisdom teeth extraction. She may be undergoing general anesthesia.  Denies: chest pain, SOB, le edema  She has CKD and has not seen nephrology, states that she has been busy and has not had time to follow-up with Dr. Moshe Cipro.  She is trying to lose weight. She has recently stopped eating potato chips and drinking sodas. She is not exercising currently due to work schedule has changed. On her off days she chooses to relax and has other obligations to get done such as chores and appointments.  HTN Currently taking Lisinopril-HCTZ 20-12.5 and spiro 25 mg BID mg. At home blood pressure readings are: not checked. Patient denies chest pain, SOB, blurred vision, dizziness, unusual headaches, lower leg swelling. Patient is compliant with medication. Denies excessive caffeine intake, stimulant usage, excessive alcohol intake, or increase in salt consumption.  BP Readings from Last 3 Encounters:  03/30/20 126/80  08/28/19 (!) 220/138  12/24/17 (!) 152/90   HLD  Currently taking lipitor 20 mg daily. Tolerating well. Denies myalgias.  Health Maintenance Due  Topic Date Due  . Hepatitis C Screening  Never done  . PAP SMEAR-Modifier  08/31/2014  . COLONOSCOPY (Pts 45-50yrs Insurance coverage will need to be confirmed)  Never done  . MAMMOGRAM  Never done  . COVID-19 Vaccine (3 - Booster for Moderna series) 01/17/2020    Past Medical History:  Diagnosis Date  . Anxiety   . Gestational diabetes    diet controlled per patient  . High-risk pregnancy   . Hypertension   . Multiparity      Social History   Tobacco Use  . Smoking status: Never Smoker  . Smokeless tobacco: Never Used   Vaping Use  . Vaping Use: Never used  Substance Use Topics  . Alcohol use: Yes    Comment: occasionally  . Drug use: No    Past Surgical History:  Procedure Laterality Date  . CESAREAN SECTION  02/06/2012   Procedure: CESAREAN SECTION;  Surgeon: Osborne Oman, MD;  Location: [facility];  Service: Obstetrics;  Laterality: N/A;  Primary Cesarean Section Delivery Baby Boy @ 0000, Apgars 8/9   . NO PAST SURGERIES      Family History  Problem Relation Age of Onset  . Diabetes Mother   . Hypertension Father   . Diabetes Father   . COPD Father   . Asthma Father   . Breast cancer Paternal Grandmother   . Other Neg Hx     PMHx, SurgHx, SocialHx, FamHx, Medications, and Allergies were reviewed in the Visit Navigator and updated as appropriate.   Patient Active Problem List   Diagnosis Date Noted  . Stage 3 chronic kidney disease ([place]) 06/12/2016  . Abnormal EKG 06/11/2016  . Malignant hypertension 06/11/2016  . Gestational Diabetes 10/10/2011  . BV (bacterial vaginosis) 08/31/2011    Social History   Tobacco Use  . Smoking status: Never Smoker  . Smokeless tobacco: Never Used  Vaping Use  . Vaping Use: Never used  Substance Use Topics  . Alcohol use: Yes    Comment: occasionally  . Drug use: No    Current Medications and Allergies:    Current  Outpatient Medications:  .  atorvastatin (LIPITOR) 20 MG tablet, Take 1 tablet (20 mg total) by mouth daily., Disp: 90 tablet, Rfl: 3 .  Cholecalciferol (VITAMIN D3) 125 MCG (5000 UT) CAPS, Take 1 capsule by mouth daily in the afternoon., Disp: , Rfl:  .  lisinopril-hydrochlorothiazide (ZESTORETIC) 20-12.5 MG tablet, Take 1 tablet by mouth daily., Disp: 90 tablet, Rfl: 1 .  Multiple Vitamins-Minerals (ALIVE ONCE DAILY WOMENS PO), Take 1 tablet by mouth daily in the afternoon., Disp: , Rfl:  .  spironolactone (ALDACTONE) 25 MG tablet, TAKE 1 TABLET(25 MG) BY MOUTH TWICE DAILY, Disp: 60 tablet, Rfl: 1   Allergies  Allergen  Reactions  . Dilaudid [Hydromorphone Hcl] Other (See Comments)    Could not stop shaking  . Norvasc [Amlodipine Besylate] Swelling    Leg swelling    Review of Systems   ROS Negative unless otherwise specified per HPI.  Vitals:   Vitals:   03/30/20 1031  BP: 126/80  Pulse: 92  Temp: 98 F (36.7 C)  TempSrc: Temporal  SpO2: 96%  Weight: 250 lb 4 oz (113.5 kg)  Height: 5\' 6"  (1.676 m)     Body mass index is 40.39 kg/m.   Physical Exam:    Physical Exam Vitals and nursing note reviewed.  Constitutional:      General: She is not in acute distress.    Appearance: She is well-developed. She is not ill-appearing, toxic-appearing or sickly-appearing.  Cardiovascular:     Rate and Rhythm: Normal rate and regular rhythm.     Pulses: Normal pulses.     Heart sounds: Normal heart sounds, S1 normal and S2 normal.     Comments: No LE edema Pulmonary:     Effort: Pulmonary effort is normal.     Breath sounds: Normal breath sounds.  Skin:    General: Skin is warm, dry and intact.  Neurological:     Mental Status: She is alert.     GCS: GCS eye subscore is 4. GCS verbal subscore is 5. GCS motor subscore is 6.  Psychiatric:        Mood and Affect: Mood and affect normal.        Speech: Speech normal.        Behavior: Behavior normal. Behavior is cooperative.      Assessment and Plan:    Tierre was seen today for medical clearance.  Diagnoses and all orders for this visit:  Pre-operative clearance EKG tracing is personally reviewed.  EKG notes NSR.  Compared to prior EKG, does have some T-wave inversions in V4 and V5 that appear to be a change.  Will refer to cardiology for further evaluation prior to clear patient for procedure. She is agreeable and verbalized understanding. -     EKG 12-Lead  Malignant hypertension Well controlled today with lisinopril-hctz 20-12.5 mg and spironolactone 25 mg BID. Update blood work today. Encouraged regular follow-up with  nephrology. -     Lipid panel -     Comprehensive metabolic panel  HLD Update lipid panel today and provide adjustments to lipitor 20 mg daily as indicated.   Stage 3 chronic kidney disease, unspecified whether stage 3a or 3b CKD ([place]) Update renal labs today. Encouraged compliance with nephrology.  Obesity, unspecified classification, unspecified obesity type, unspecified whether serious comorbidity present Reviewed portions of starches and need for scheduled exercise (after seeing cardiology.)  CMA or LPN served as scribe during this visit. History, Physical, and Plan performed by medical provider. The above  documentation has been reviewed and is accurate and complete.   Inda Coke, PA-C [place], [place] 03/30/2020  Follow-up: No follow-ups on file.

## 2020-03-30 NOTE — Patient Instructions (Addendum)
It was great to see you!  Portion control is important!  You will be contacted about your cardiology appointment. Please be sure to take the form with you to your appointment so they can complete this.  I will update your blood work today as well.  Take care,  Inda Coke PA-C

## 2020-04-02 ENCOUNTER — Ambulatory Visit: Payer: Managed Care, Other (non HMO) | Admitting: Cardiology

## 2020-04-02 ENCOUNTER — Encounter: Payer: Self-pay | Admitting: Cardiology

## 2020-04-02 ENCOUNTER — Other Ambulatory Visit: Payer: Self-pay

## 2020-04-02 VITALS — BP 122/80 | HR 82 | Ht 66.5 in | Wt 248.6 lb

## 2020-04-02 DIAGNOSIS — R9431 Abnormal electrocardiogram [ECG] [EKG]: Secondary | ICD-10-CM | POA: Diagnosis not present

## 2020-04-02 DIAGNOSIS — G4719 Other hypersomnia: Secondary | ICD-10-CM

## 2020-04-02 DIAGNOSIS — Z01810 Encounter for preprocedural cardiovascular examination: Secondary | ICD-10-CM

## 2020-04-02 DIAGNOSIS — I10 Essential (primary) hypertension: Secondary | ICD-10-CM | POA: Diagnosis not present

## 2020-04-02 NOTE — Addendum Note (Signed)
Addended by: Antonieta Iba on: 04/02/2020 03:20 PM   Modules accepted: Orders

## 2020-04-02 NOTE — Progress Notes (Addendum)
Cardiology Office Note    Date:  04/02/2020   ID:  Tina Richardson, DOB 29-Jul-1970, MRN [id number]  PCP:  Inda Coke, [facility]  Cardiologist:  Fransico Him, MD   Chief Complaint  Patient presents with  . New Patient (Initial Visit)    Abnormal EKG and HTN    History of Present Illness:  Tina Richardson is a 50 y.o. female who is being seen today for the evaluation of abnormal EKG at the request of Inda Coke, [place].  This is a 50yo female with a hx of gestational DM and HTN.  She was recently seen by her PCP for preoperative cardiac clearance for wisdom teeth extraction and was noted to have T wave inversions in V4 and V5.  She also has a hx of malignant HTN and is followed by nephrology for CKD stage 3a She is now referred for further evaluation of abnormal EKG.  She denies any chest pain or pressure, PND, orthopnea, LE edema, palpitations (except speeds up with anxiety) or syncope. She says that she feels SOB when she wears her mask and when she walks up stairs.  She used to get exercise but has become very sedentary after COVID 19 started.   She also has had some dizziness that she describes as a felling of being off balance with her surroundings.  She is compliant with her meds and is tolerating meds with no SE.    She has never smoked before.  She thinks her father had an MI in the past because he takes SL NTG.  Past Medical History:  Diagnosis Date  . Anxiety   . Gestational diabetes    diet controlled per patient  . High-risk pregnancy   . Hypertension   . Multiparity     Past Surgical History:  Procedure Laterality Date  . CESAREAN SECTION  02/06/2012   Procedure: CESAREAN SECTION;  Surgeon: Osborne Oman, MD;  Location: [facility];  Service: Obstetrics;  Laterality: N/A;  Primary Cesarean Section Delivery Baby Boy @ 0000, Apgars 8/9   . NO PAST SURGERIES      Current Medications: Current Meds  Medication Sig  . atorvastatin (LIPITOR) 40 MG tablet Take 1  tablet (40 mg total) by mouth daily.  . Cholecalciferol (VITAMIN D3) 125 MCG (5000 UT) CAPS Take 1 capsule by mouth daily in the afternoon.  Marland Kitchen lisinopril-hydrochlorothiazide (ZESTORETIC) 20-12.5 MG tablet Take 1 tablet by mouth daily.  . Multiple Vitamins-Minerals (ALIVE ONCE DAILY WOMENS PO) Take 1 tablet by mouth daily in the afternoon.  Marland Kitchen spironolactone (ALDACTONE) 25 MG tablet TAKE 1 TABLET(25 MG) BY MOUTH TWICE DAILY    Allergies:   Dilaudid [hydromorphone hcl] and Norvasc [amlodipine besylate]   Social History   Socioeconomic History  . Marital status: Single    Spouse name: Not on file  . Number of children: Not on file  . Years of education: Not on file  . Highest education level: Not on file  Occupational History  . Not on file  Tobacco Use  . Smoking status: Never Smoker  . Smokeless tobacco: Never Used  Vaping Use  . Vaping Use: Never used  Substance and Sexual Activity  . Alcohol use: Yes    Comment: occasionally  . Drug use: No  . Sexual activity: Yes    Birth control/protection: None  Other Topics Concern  . Not on file  Social History Narrative   Work Full Time at [employer]   8 children --  40 and 41 yo   Social Determinants of Radio broadcast assistant Strain: Not on file  Food Insecurity: Not on file  Transportation Needs: Not on file  Physical Activity: Not on file  Stress: Not on file  Social Connections: Not on file     Family History:  The patient's family history includes Asthma in her father; Breast cancer in her paternal grandmother; COPD in her father; Diabetes in her father and mother; Hypertension in her father.   ROS:   Please see the history of present illness.    ROS All other systems reviewed and are negative.  No flowsheet data found.     PHYSICAL EXAM:   VS:  BP 122/80   Pulse 82   Ht 5' 6.5" (1.689 m)   Wt 248 lb 9.6 oz (112.8 kg)   LMP 03/24/2017 (Approximate)   SpO2 95%   BMI 39.52 kg/m     GEN: Well nourished, well developed, in no acute distress  HEENT: normal  Neck: no JVD, carotid bruits, or masses Cardiac: RRR; no murmurs, rubs, or gallops,no edema.  Intact distal pulses bilaterally.  Respiratory:  clear to auscultation bilaterally, normal work of breathing GI: soft, nontender, nondistended, + BS MS: no deformity or atrophy  Skin: warm and dry, no rash Neuro:  Alert and Oriented x 3, Strength and sensation are intact Psych: euthymic mood, full affect  Wt Readings from Last 3 Encounters:  04/02/20 248 lb 9.6 oz (112.8 kg)  03/30/20 250 lb 4 oz (113.5 kg)  08/28/19 252 lb 3.2 oz (114.4 kg)      Studies/Labs Reviewed:   EKG:  EKG is ordered today.  The ekg ordered today demonstrates NSR with LVH and ST/T wave abnormality c/w repol abnormality  Recent Labs: 08/28/2019: Hemoglobin 14.9; Platelets 339 03/30/2020: ALT 20; BUN 30; Creatinine, Ser 1.53; Potassium 3.9; Sodium 142   Lipid Panel    Component Value Date/Time   CHOL 204 (H) 03/30/2020 1129   TRIG 206.0 (H) 03/30/2020 1129   HDL 37.40 (L) 03/30/2020 1129   CHOLHDL 5 03/30/2020 1129   VLDL 41.2 (H) 03/30/2020 1129   LDLCALC 183 (H) 08/28/2019 1149   LDLDIRECT 151.0 03/30/2020 1129     Additional studies/ records that were reviewed today include:  OV notes from PCP    ASSESSMENT:    1. Nonspecific abnormal electrocardiogram (ECG) (EKG)   2. Primary hypertension   3. Preoperative cardiovascular examination   4. Excessive daytime sleepiness      PLAN:  In order of problems listed above:  Abnormal EKG -she has nonspecific T wave changes in the lateral precordial leads that are likely related to HTN as she has LVH by voltage and likely repol changes -I will get a 2D echo to see if she has LVH on echo -needs aggressive Rx of HTN>>controlled currently -she denies any chest pain but does have some DOE likely related to sedentary state and obesity -I will get a coronary Ca score to assess future  risk  HTN -BP controlled on exam today -continue Lisinopril HCT 20-12.5mg  daily and spironolactone 25mg  daily  Preoperative Cardiac Clearance -she is getting her wisdom teeth pulled -she has no cardiac symptoms except for SOB likely related to obesity -she is able to achieve at least 5.62 mets by DASI and therefore no further ischemic workup needed at this time -She is at low risk with 0.4% perioperative risk of major cardiac event  Excessive Daytime Sleepiness -given her obesity, suspect that  she has OSA.  She also snores -she feels exhausted during the day -I will get a split night sleep study  Medication Adjustments/Labs and Tests Ordered: Current medicines are reviewed at length with the patient today.  Concerns regarding medicines are outlined above.  Medication changes, Labs and Tests ordered today are listed in the Patient Instructions below.  Patient Instructions  Medication Instructions:  Your physician recommends that you continue on your current medications as directed. Please refer to the Current Medication list given to you today.  *If you need a refill on your cardiac medications before your next appointment, please call your pharmacy*   Lab Work:  If you have labs (blood work) drawn today and your tests are completely normal, you will receive your results only by: Marland Kitchen MyChart Message (if you have MyChart) OR . A paper copy in the mail If you have any lab test that is abnormal or we need to change your treatment, we will call you to review the results.   Testing/Procedures: Your physician has requested that you have an echocardiogram. Echocardiography is a painless test that uses sound waves to create images of your heart. It provides your doctor with information about the size and shape of your heart and how well your heart's chambers and valves are working. This procedure takes approximately one hour. There are no restrictions for this procedure.  Your physician  has requested that you have a calcium score CT scan.  Follow-Up: At [facility], you and your health needs are our priority.  As part of our continuing mission to provide you with exceptional heart care, we have created designated Provider Care Teams.  These Care Teams include your primary Cardiologist (physician) and Advanced Practice Providers (APPs -  Physician Assistants and Nurse Practitioners) who all work together to provide you with the care you need, when you need it.  Your next appointment:     The format for your next appointment:   In Person  Provider:   You may see Fransico Him, MD or one of the following Advanced Practice Providers on your designated Care Team:    Melina Copa, PA-C  Ermalinda Barrios, PA-C        Signed, Fransico Him, MD  04/02/2020 3:21 PM    [address], [place], [place]  [address] Phone: [phone]; Fax: [phone]

## 2020-04-02 NOTE — Addendum Note (Signed)
Addended by: Antonieta Iba on: 04/02/2020 03:23 PM   Modules accepted: Orders

## 2020-04-02 NOTE — Addendum Note (Signed)
Addended by: Sueanne Margarita on: 04/02/2020 03:21 PM   Modules accepted: Orders

## 2020-04-02 NOTE — Patient Instructions (Addendum)
Medication Instructions:  Your physician recommends that you continue on your current medications as directed. Please refer to the Current Medication list given to you today.  *If you need a refill on your cardiac medications before your next appointment, please call your pharmacy*  Testing/Procedures: Your physician has requested that you have an echocardiogram. Echocardiography is a painless test that uses sound waves to create images of your heart. It provides your doctor with information about the size and shape of your heart and how well your heart's chambers and valves are working. This procedure takes approximately one hour. There are no restrictions for this procedure.  Your physician has requested that you have a calcium score CT scan.  Your physician has recommended that you have a sleep study. This test records several body functions during sleep, including: brain activity, eye movement, oxygen and carbon dioxide blood levels, heart rate and rhythm, breathing rate and rhythm, the flow of air through your mouth and nose, snoring, body muscle movements, and chest and belly movement.  Follow-Up: At [facility], you and your health needs are our priority.  As part of our continuing mission to provide you with exceptional heart care, we have created designated Provider Care Teams.  These Care Teams include your primary Cardiologist (physician) and Advanced Practice Providers (APPs -  Physician Assistants and Nurse Practitioners) who all work together to provide you with the care you need, when you need it.  Follow up with Dr. Radford Pax as needed based on results of testing.

## 2020-04-03 ENCOUNTER — Telehealth: Payer: Self-pay | Admitting: *Deleted

## 2020-04-03 NOTE — Telephone Encounter (Signed)
-----   Message from Antonieta Iba, RN sent at 04/02/2020  3:27 PM EST ----- Split night sleep study has been ordered for excessive daytime sleepiness.  Thanks!

## 2020-04-08 ENCOUNTER — Telehealth: Payer: Self-pay | Admitting: *Deleted

## 2020-04-08 NOTE — Telephone Encounter (Signed)
PA for in lab split night sleep study faxed to [facility]. Request ID:[id number].

## 2020-04-13 ENCOUNTER — Telehealth: Payer: Self-pay | Admitting: *Deleted

## 2020-04-13 DIAGNOSIS — G4719 Other hypersomnia: Secondary | ICD-10-CM

## 2020-04-13 NOTE — Telephone Encounter (Signed)
Staff message sent to [facility] received a denial letter from [facility] for in lab split night sleep study. Denial letter faxed to [facility] for Dr Radford Pax to review.

## 2020-04-14 ENCOUNTER — Other Ambulatory Visit: Payer: Self-pay | Admitting: *Deleted

## 2020-04-14 MED ORDER — ATORVASTATIN CALCIUM 40 MG PO TABS: 40.0000 mg | ORAL_TABLET | Freq: Every day | ORAL | 1 refills | Status: AC

## 2020-04-14 MED ORDER — LISINOPRIL-HYDROCHLOROTHIAZIDE 20-12.5 MG PO TABS: 1.0000 | ORAL_TABLET | Freq: Every day | ORAL | 1 refills | Status: AC

## 2020-04-14 MED ORDER — SPIRONOLACTONE 25 MG PO TABS: ORAL_TABLET | ORAL | 1 refills | Status: DC

## 2020-04-15 NOTE — Addendum Note (Signed)
Addended by: Freada Bergeron on: 04/15/2020 05:24 PM   Modules accepted: Orders

## 2020-04-16 NOTE — Telephone Encounter (Signed)
Per Dr Radford Pax order a HST.

## 2020-04-16 NOTE — Telephone Encounter (Signed)
Per Dr Radford Pax, ORDER HST:  Patient is scheduled for HST study on 06/01/20. Patient understands her sleep study will be done at [facility].. Patient understands she will receive a sleep packet in a week or so. Patient understands to call if she does not receive the sleep packet in a timely manner. Patient agrees with treatment and thanked me for call.

## 2020-04-16 NOTE — Telephone Encounter (Signed)
Tina Richardson, CMA  Tina Richardson, CMA Split night has been denied by [facility]. Letter faxed to you for TT to review.      From: Antonieta Iba, RN  Sent: 04/02/2020  3:27 PM EDT  To: Tina Richardson, CMA, Cv Div Sleep Studies   Split night sleep study has been ordered for excessive daytime sleepiness.  Thanks!

## 2020-04-16 NOTE — Telephone Encounter (Signed)
Patient is scheduled for HST study on 06/01/20. Patient understands her sleep study will be done at [facility].. Patient understands she will receive a sleep packet in a week or so. Patient understands to call if she does not receive the sleep packet in a timely manner. Patient agrees with treatment and thanked me for call.

## 2020-04-22 ENCOUNTER — Telehealth: Payer: Self-pay

## 2020-04-22 ENCOUNTER — Ambulatory Visit: Payer: Managed Care, Other (non HMO) | Admitting: Physician Assistant

## 2020-04-22 ENCOUNTER — Emergency Department (HOSPITAL_COMMUNITY): Payer: Managed Care, Other (non HMO)

## 2020-04-22 ENCOUNTER — Emergency Department (HOSPITAL_COMMUNITY)
Admission: EM | Admit: 2020-04-22 | Discharge: 2020-04-22 | Disposition: A | Discharge status: Home / Self Care | Payer: Managed Care, Other (non HMO) | Attending: Emergency Medicine | Admitting: Emergency Medicine

## 2020-04-22 ENCOUNTER — Other Ambulatory Visit: Payer: Self-pay

## 2020-04-22 ENCOUNTER — Encounter: Payer: Self-pay | Admitting: Physician Assistant

## 2020-04-22 VITALS — BP 149/98 | HR 77 | Temp 98.2°F | Ht 66.5 in | Wt 248.2 lb

## 2020-04-22 DIAGNOSIS — R42 Dizziness and giddiness: Secondary | ICD-10-CM | POA: Insufficient documentation

## 2020-04-22 DIAGNOSIS — N183 Chronic kidney disease, stage 3 unspecified: Secondary | ICD-10-CM | POA: Diagnosis not present

## 2020-04-22 DIAGNOSIS — R0602 Shortness of breath: Secondary | ICD-10-CM | POA: Diagnosis not present

## 2020-04-22 DIAGNOSIS — I129 Hypertensive chronic kidney disease with stage 1 through stage 4 chronic kidney disease, or unspecified chronic kidney disease: Secondary | ICD-10-CM | POA: Diagnosis not present

## 2020-04-22 DIAGNOSIS — Z79899 Other long term (current) drug therapy: Secondary | ICD-10-CM | POA: Diagnosis not present

## 2020-04-22 DIAGNOSIS — R079 Chest pain, unspecified: Secondary | ICD-10-CM | POA: Insufficient documentation

## 2020-04-22 LAB — CBC
HCT: 41.8 % (ref 36.0–46.0)
Hemoglobin: 13.4 g/dL (ref 12.0–15.0)
MCH: 27.7 pg (ref 26.0–34.0)
MCHC: 32.1 g/dL (ref 30.0–36.0)
MCV: 86.4 fL (ref 80.0–100.0)
Platelets: 301 10*3/uL (ref 150–400)
RBC: 4.84 MIL/uL (ref 3.87–5.11)
RDW: 13.9 % (ref 11.5–15.5)
WBC: 8.3 10*3/uL (ref 4.0–10.5)
nRBC: 0 % (ref 0.0–0.2)

## 2020-04-22 LAB — HEPATIC FUNCTION PANEL
ALT: 23 U/L (ref 0–44)
AST: 22 U/L (ref 15–41)
Albumin: 3.2 g/dL — ABNORMAL LOW (ref 3.5–5.0)
Alkaline Phosphatase: 59 U/L (ref 38–126)
Bilirubin, Direct: 0.2 mg/dL (ref 0.0–0.2)
Indirect Bilirubin: 0.5 mg/dL (ref 0.3–0.9)
Total Bilirubin: 0.7 mg/dL (ref 0.3–1.2)
Total Protein: 5.6 g/dL — ABNORMAL LOW (ref 6.5–8.1)

## 2020-04-22 LAB — BASIC METABOLIC PANEL
Anion gap: 12 (ref 5–15)
BUN: 28 mg/dL — ABNORMAL HIGH (ref 6–20)
CO2: 22 mmol/L (ref 22–32)
Calcium: 9 mg/dL (ref 8.9–10.3)
Chloride: 105 mmol/L (ref 98–111)
Creatinine, Ser: 1.54 mg/dL — ABNORMAL HIGH (ref 0.44–1.00)
GFR, Estimated: 41 mL/min — ABNORMAL LOW (ref >60)
Glucose, Bld: 90 mg/dL (ref 70–99)
Potassium: 4.5 mmol/L (ref 3.5–5.1)
Sodium: 139 mmol/L (ref 135–145)

## 2020-04-22 LAB — TROPONIN I (HIGH SENSITIVITY)
Troponin I (High Sensitivity): 6 ng/L (ref <18)
Troponin I (High Sensitivity): 8 ng/L (ref <18)

## 2020-04-22 LAB — LIPASE, BLOOD: Lipase: 28 U/L (ref 11–51)

## 2020-04-22 LAB — I-STAT BETA HCG BLOOD, ED (MC, WL, AP ONLY): I-stat hCG, quantitative: <5 m[IU]/mL (ref <5)

## 2020-04-22 MED ORDER — NITROGLYCERIN 0.4 MG SL SUBL: 0.4000 mg | SUBLINGUAL_TABLET | SUBLINGUAL | Status: DC | PRN

## 2020-04-22 NOTE — ED Provider Notes (Signed)
[facility] EMERGENCY DEPARTMENT Provider Note   CSN: [id number] Arrival date & time: 04/22/20  1512     History Chief Complaint  Patient presents with  . Chest Pain  . Hypertension    Tina Richardson is a 50 y.o. female history of HTN, renal disease, irregular EKG, BMI of 39 presents to the ED from primary care doctor for evaluation of chest pain.  This began this late morning.  Cannot describe what it feels like, it goes directly into her back.  It feels like a rib pain like a rib is out of place.  At first thought that it was the underwire of her new bra.  The pain has been intermittent.  Currently has mild discomfort.  She was given 4 baby aspirin at PCP office and feels like this made the chest discomfort better.  Has noticed pain worsens with exertion.  Going up her stairs specifically makes her discomfort worse as well as makes her short of breath.  Attributes this to increased weight gain of 20 pounds in the last year and being more sedentary.  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  Attributes this to just getting older.  Denies previous chest discomfort like this in the past.  Reports this morning she woke up feeling "bad".  Describes as feeling dizzy, off balance.  Felt like she was intoxicated.  Like she standing on a boat.  This feeling began this morning but states she has had it in the past.  Dizziness is worse with laying flat, head movement specifically movement to the left as well as going from laying to sitting.  Feels like a "rush feeling in the head".  This dizziness has been constant throughout the day.  It worsened when EMS was pushing her on the stretcher out of the doctor's office, she felt like she was going to fall.  Had a bloody nose this morning with blood clot.  She applied pressure and this stopped.  Denies nausea, vomiting, abdominal pain.  Denies recent leg  swelling, calf pain.  Has been told in the past that she has an abnormal EKG.  She recently saw Dr. Radford Pax who wanted to do a calcium score and echocardiogram.  She has an appointment for this April 5.  Her father had a heart attack in her 51s, he is to take nitroglycerin.  Denies history of diabetes, hyperlipidemia, tobacco use, illicit drug use including cocaine, known heart disease.  No syncope, orthopnea, PND.  No history of DVT, PE, recent surgery or prolonged immobilization, calf swelling or pain, hormone therapy, hemoptysis.  HPI     Past Medical History:  Diagnosis Date  . Anxiety   . Gestational diabetes    diet controlled per patient  . High-risk pregnancy   . Hypertension   . Multiparity     Patient Active Problem List   Diagnosis Date Noted  . Stage 3 chronic kidney disease ([place]) 06/12/2016  . Abnormal EKG 06/11/2016  . Malignant hypertension 06/11/2016  . Gestational Diabetes 10/10/2011  . BV (bacterial vaginosis) 08/31/2011    Past Surgical History:  Procedure Laterality Date  . CESAREAN SECTION  02/06/2012   Procedure: CESAREAN SECTION;  Surgeon: Osborne Oman, MD;  Location: [facility];  Service: Obstetrics;  Laterality: N/A;  Primary Cesarean Section Delivery Baby Boy @ 0000, Apgars 8/9   . NO PAST SURGERIES  OB History    Gravida  8   Para  8   Term  8   Preterm      AB      Living  8     SAB      IAB      Ectopic      Multiple      Live Births  2           Family History  Problem Relation Age of Onset  . Diabetes Mother   . Hypertension Father   . Diabetes Father   . COPD Father   . Asthma Father   . Breast cancer Paternal Grandmother   . Other Neg Hx     Social History   Tobacco Use  . Smoking status: Never Smoker  . Smokeless tobacco: Never Used  Vaping Use  . Vaping Use: Never used  Substance Use Topics  . Alcohol use: Yes    Comment: occasionally  . Drug use: No    Home Medications Prior to Admission  medications   Medication Sig Start Date End Date Taking? Authorizing Provider  Cholecalciferol (VITAMIN D3) 125 MCG (5000 UT) CAPS Take 1 capsule by mouth daily in the afternoon.   Yes [provider]  lisinopril-hydrochlorothiazide (ZESTORETIC) 20-12.5 MG tablet Take 1 tablet by mouth daily. 04/14/20  Yes Worley, Aldona Bar, PA  Multiple Vitamins-Minerals (ALIVE ONCE DAILY WOMENS PO) Take 1 tablet by mouth daily in the afternoon.   Yes [provider]  spironolactone (ALDACTONE) 25 MG tablet TAKE 1 TABLET(25 MG) BY MOUTH TWICE DAILY Patient taking differently: Take 25 mg by mouth 2 (two) times daily. 04/14/20  Yes Inda Coke, PA  atorvastatin (LIPITOR) 40 MG tablet Take 1 tablet (40 mg total) by mouth daily. Patient not taking: Reported on 04/22/2020 04/14/20   Inda Coke, PA    Allergies    Dilaudid [hydromorphone hcl] and Norvasc [amlodipine besylate]  Review of Systems   Review of Systems  Respiratory: Positive for shortness of breath.   Cardiovascular: Positive for chest pain.  Neurological: Positive for dizziness.  All other systems reviewed and are negative.   Physical Exam Updated Vital Signs BP 128/79   Pulse 74   Resp 17   Ht 5' 6.5" (1.689 m)   Wt 112.5 kg   LMP 03/24/2017 (Approximate)   SpO2 99%   BMI 39.43 kg/m   Physical Exam Constitutional:      Appearance: She is well-developed.     Comments: NAD. Non toxic.   HENT:     Head: Normocephalic and atraumatic.     Nose: Nose normal.  Eyes:     General: Lids are normal.     Conjunctiva/sclera: Conjunctivae normal.  Neck:     Trachea: Trachea normal.     Comments: Trachea midline.  Cardiovascular:     Rate and Rhythm: Normal rate and regular rhythm.     Pulses:          Radial pulses are 1+ on the right side and 1+ on the left side.       Dorsalis pedis pulses are 1+ on the right side and 1+ on the left side.     Heart sounds: Normal heart sounds, S1 normal and S2 normal.      Comments: No murmurs. No LE edema or calf tenderness.  Pulmonary:     Effort: Pulmonary effort is normal.     Breath sounds: Normal breath sounds.  Abdominal:  General: Bowel sounds are normal.     Palpations: Abdomen is soft.     Tenderness: There is no abdominal tenderness.     Comments: No epigastric or upper abdominal tenderness.  Musculoskeletal:     Cervical back: Normal range of motion.  Skin:    General: Skin is warm and dry.     Capillary Refill: Capillary refill takes less than 2 seconds.     Comments: No rash to chest wall. Skin normal over chest/breast   Neurological:     Mental Status: She is alert.     GCS: GCS eye subscore is 4. GCS verbal subscore is 5. GCS motor subscore is 6.     Comments: Sensation and strength intact in upper/lower extremities  Psychiatric:        Speech: Speech normal.        Behavior: Behavior normal.        Thought Content: Thought content normal.     ED Results / Procedures / Treatments   Labs (all labs ordered are listed, but only abnormal results are displayed) Labs Reviewed  BASIC METABOLIC PANEL - Abnormal; Notable for the following components:      Result Value   BUN 28 (*)    Creatinine, Ser 1.54 (*)    GFR, Estimated 41 (*)    All other components within normal limits  HEPATIC FUNCTION PANEL - Abnormal; Notable for the following components:   Total Protein 5.6 (*)    Albumin 3.2 (*)    All other components within normal limits  CBC  LIPASE, BLOOD  I-STAT BETA HCG BLOOD, ED ([facility], [facility], [facility] ONLY)  TROPONIN I (HIGH SENSITIVITY)  TROPONIN I (HIGH SENSITIVITY)    EKG None  Radiology DG Chest 2 View  Result Date: 04/22/2020 CLINICAL DATA:  Left-sided chest pain radiating to the back. EXAM: CHEST - 2 VIEW COMPARISON:  Radiograph 05/24/2017 FINDINGS: The cardiomediastinal contours are normal. The lungs are clear. Pulmonary vasculature is normal. No consolidation, pleural effusion, or pneumothorax. No acute osseous  abnormalities are seen. IMPRESSION: Negative radiographs of the chest. Electronically Signed   By: Keith Rake M.D.   On: 04/22/2020 17:19    Procedures Procedures   Medications Ordered in ED Medications  nitroGLYCERIN (NITROSTAT) SL tablet 0.4 mg (has no administration in time range)    ED Course  I have reviewed the triage vital signs and the nursing notes.  Pertinent labs & imaging results that were available during my care of the patient were reviewed by me and considered in my medical decision making (see chart for details).  Clinical Course as of 04/22/20 2218  Wed Apr 22, 2020  2058 Troponin I (High Sensitivity): 8 [CG]    Clinical Course User Index [CG] Arlean Hopping   MDM Rules/Calculators/A&P                          50 year old female presents to the ED for left-sided chest pain since this morning, intermittent, worse with going up the stairs associated with shortness of breath.  Ongoing but milder pain in the ED.  Chest pain is nonpleuritic, nonpositional.  Endorses some acid reflux in the past but this feels different.  No infectious symptoms.  Does endorse some dizziness that appears to be chronic and sounds like vertigo.  I do not think they are related.  Cardiac risk factors include hypertension, BMI, positive family history.  EMR, triage nurse notes reviewed to assist  with MDM and obtain more history  Recently seen by Dr. Radford Pax for "abnormal" EKG.  Per note, Dr. Radford Pax felt like EKG changes were related to LVH.  Patient has upcoming calcium score and echocardiogram.  Lab work, imaging ordered by me as above.  Lab work and imaging personally visualized and interpreted  Labs-troponin 6 > 8.  LFTs, lipase normal.  PE considered however patient has no risk factors for this, wells score is zero. Not tachypnic, tachcyardic, hypoxic. No pleuritic CP.  Creatinine at baseline.   Imaging-chest x-ray without acute findings.  EKG personally reviewed,  nonspecific ST segment abnormalities that I think this is related to LVH.  EKG was also reviewed with attending.  Discussed as well with cardiology Dr. Ellyn Hack who reviewed patient's chart and EKG.  HEART score 4-5.  Cardiology was consulted.  Dr. Ellyn Hack has reviewed patient's chart.  He called Dr. Radford Pax.  They recommend repeat troponin and if unremarkable appropriate for discharge.  Dr. Radford Pax has added a Myoview stress test to patient's upcoming cardiology work-up.  Plan was discussed with patient and mother at bedside.  They are in agreement.  Her chest pain resolved in the ED.  Return precautions discussed.  She is comfortable with this plan.  Final Clinical Impression(s) / ED Diagnoses Final diagnoses:  Chest pain in adult    Rx / DC Orders ED Discharge Orders    None       Arlean Hopping 04/22/20 2218    Tegeler, Gwenyth Allegra, MD 04/23/20 [phone]

## 2020-04-22 NOTE — Progress Notes (Signed)
Acute Office Visit  Subjective:    Patient ID: Tina Richardson, female    DOB: August 06, 1970, 50 y.o.   MRN: [id number]  Chief Complaint  Patient presents with  . Dizziness    HPI Patient is in today for dizziness. She woke up this morning with a lot of nasal congestion and then had a nosebleed. She was able to get the bleeding to stop on its own. She has also had loss of appetite today. Feels like she is "off-balance" like she is walking around in a circus fun house. In office as we are discussing her history, she grabs around her left breast and complains of pain then says "I wonder if it's just my under wire is too tight on my bra." She feels like she is having GERD symptoms, which is unusual for her.   Recently seen on 03/30/20 for pre-op clearance for wisdom teeth and noted to have abnormal ECG (T wave inversions in V4 and V5) at that time and was referred to cardiology. She was seen by Dr. Radford Pax and advised to have 2D ECHO & Coronary Ca score. Hx of malignant HTN - BP was stable at that visit and no medication changes were made.   Past Medical History:  Diagnosis Date  . Anxiety   . Gestational diabetes    diet controlled per patient  . High-risk pregnancy   . Hypertension   . Multiparity     Past Surgical History:  Procedure Laterality Date  . CESAREAN SECTION  02/06/2012   Procedure: CESAREAN SECTION;  Surgeon: Osborne Oman, MD;  Location: [facility];  Service: Obstetrics;  Laterality: N/A;  Primary Cesarean Section Delivery Baby Boy @ 0000, Apgars 8/9   . NO PAST SURGERIES      Family History  Problem Relation Age of Onset  . Diabetes Mother   . Hypertension Father   . Diabetes Father   . COPD Father   . Asthma Father   . Breast cancer Paternal Grandmother   . Other Neg Hx     Social History   Socioeconomic History  . Marital status: Single    Spouse name: Not on file  . Number of children: Not on file  . Years of education: Not on file  . Highest education  level: Not on file  Occupational History  . Not on file  Tobacco Use  . Smoking status: Never Smoker  . Smokeless tobacco: Never Used  Vaping Use  . Vaping Use: Never used  Substance and Sexual Activity  . Alcohol use: Yes    Comment: occasionally  . Drug use: No  . Sexual activity: Yes    Birth control/protection: None  Other Topics Concern  . Not on file  Social History Narrative   Work Full Time at [employer]   Single   8 children -- 24 and 22 yo   Social Determinants of Radio broadcast assistant Strain: Not on file  Food Insecurity: Not on file  Transportation Needs: Not on file  Physical Activity: Not on file  Stress: Not on file  Social Connections: Not on file  Intimate Partner Violence: Not on file    Outpatient Medications Prior to Visit  Medication Sig Dispense Refill  . atorvastatin (LIPITOR) 40 MG tablet Take 1 tablet (40 mg total) by mouth daily. 90 tablet 1  . Cholecalciferol (VITAMIN D3) 125 MCG (5000 UT) CAPS Take 1 capsule by mouth daily in the afternoon.    Marland Kitchen lisinopril-hydrochlorothiazide (  ZESTORETIC) 20-12.5 MG tablet Take 1 tablet by mouth daily. 90 tablet 1  . Multiple Vitamins-Minerals (ALIVE ONCE DAILY WOMENS PO) Take 1 tablet by mouth daily in the afternoon.    Marland Kitchen spironolactone (ALDACTONE) 25 MG tablet TAKE 1 TABLET(25 MG) BY MOUTH TWICE DAILY 180 tablet 1   No facility-administered medications prior to visit.    Allergies  Allergen Reactions  . Dilaudid [Hydromorphone Hcl] Other (See Comments)    Could not stop shaking  . Norvasc [Amlodipine Besylate] Swelling    Leg swelling    Review of Systems  Constitutional: Positive for fatigue. Negative for fever.  HENT: Positive for congestion.   Eyes: Negative for visual disturbance.  Respiratory: Negative for shortness of breath.   Cardiovascular: Positive for chest pain.  Gastrointestinal:       Complains of a GERD-like feeling  Neurological: Positive for dizziness,  weakness, light-headedness and headaches.  Psychiatric/Behavioral: The patient is nervous/anxious.        Objective:    Physical Exam Vitals and nursing note reviewed.  Constitutional:      Appearance: Normal appearance. She is normal weight. She is not toxic-appearing.     Comments: Off-balance, continues to say "I don't like this. I don't feel right." during our interaction. Grabs left side of her chest a few times during exam and says "It feels like something is breaking in there."  HENT:     Head: Normocephalic and atraumatic.     Right Ear: Tympanic membrane, ear canal and external ear normal.     Left Ear: Tympanic membrane, ear canal and external ear normal.     Nose: Nose normal.     Mouth/Throat:     Mouth: Mucous membranes are moist.  Eyes:     Extraocular Movements: Extraocular movements intact.     Conjunctiva/sclera: Conjunctivae normal.     Pupils: Pupils are equal, round, and reactive to light.  Cardiovascular:     Rate and Rhythm: Normal rate and regular rhythm.     Pulses: Normal pulses.     Heart sounds: Normal heart sounds.     Comments: Chest pain is not reproducible. She points to pain around underside of left breast into her back.  Pulmonary:     Effort: Pulmonary effort is normal.     Breath sounds: Normal breath sounds.  Abdominal:     General: Abdomen is flat. Bowel sounds are normal.     Palpations: Abdomen is soft.  Musculoskeletal:        General: Normal range of motion.     Cervical back: Normal range of motion and neck supple.  Skin:    General: Skin is warm and dry.  Neurological:     General: No focal deficit present.     Mental Status: She is alert and oriented to person, place, and time.     Comments: Dix-hallpike negative  Psychiatric:        Mood and Affect: Mood normal.        Behavior: Behavior normal.        Thought Content: Thought content normal.        Judgment: Judgment normal.     BP (!) 149/98   Pulse 77   Temp 98.2 F  (36.8 C)   Ht 5' 6.5" (1.689 m)   Wt 248 lb 3.2 oz (112.6 kg)   LMP 03/24/2017 (Approximate)   SpO2 98%   BMI 39.46 kg/m  Wt Readings from Last 3 Encounters:  04/22/20 248 lb  3.2 oz (112.6 kg)  04/02/20 248 lb 9.6 oz (112.8 kg)  03/30/20 250 lb 4 oz (113.5 kg)     Lab Results  Component Value Date   TSH 2.78 06/15/2015   Lab Results  Component Value Date   WBC 7.6 08/28/2019   HGB 14.9 08/28/2019   HCT 46.5 (H) 08/28/2019   MCV 83.5 08/28/2019   PLT 339 08/28/2019   Lab Results  Component Value Date   NA 142 03/30/2020   K 3.9 03/30/2020   CO2 27 03/30/2020   GLUCOSE 103 (H) 03/30/2020   BUN 30 (H) 03/30/2020   CREATININE 1.53 (H) 03/30/2020   BILITOT 0.5 03/30/2020   ALKPHOS 92 03/30/2020   AST 17 03/30/2020   ALT 20 03/30/2020   PROT 7.2 03/30/2020   ALBUMIN 4.2 03/30/2020   CALCIUM 9.9 03/30/2020   ANIONGAP 9 12/24/2017   GFR 39.47 (L) 03/30/2020   Lab Results  Component Value Date   CHOL 204 (H) 03/30/2020   Lab Results  Component Value Date   HDL 37.40 (L) 03/30/2020   Lab Results  Component Value Date   LDLCALC 183 (H) 08/28/2019   Lab Results  Component Value Date   TRIG 206.0 (H) 03/30/2020   Lab Results  Component Value Date   CHOLHDL 5 03/30/2020   Lab Results  Component Value Date   HGBA1C 6.2 (H) 08/28/2019       Assessment & Plan:   Problem List Items Addressed This Visit   None   Visit Diagnoses    Dizziness    -  Primary   Relevant Orders   EKG 12-Lead (Completed)   Chest pain, unspecified type         1. Dizziness 2. Chest pain, unspecified type 50 year old patient with history of malignant hypertension and hyperlipidemia presents today for dizziness.  While in office she starts complaining of new left-sided chest pain.  ECG was obtained in the office today.  ECG per my review is abnormal but compared to twelve-lead on 03/30/2020, no acute changes.  She does have evidence of LVH, rate of 63 bpm, diffuse T wave  abnormality.  I am very concerned about possible NSTEMI with her symptoms going on.  She drove herself to our office today.  I am not comfortable with her driving herself to the emergency department at this time.  She agreed to having ambulance take her in today for further evaluation.  This note was prepared with assistance of Systems analyst. Occasional wrong-word or sound-a-like substitutions may have occurred due to the inherent limitations of voice recognition software. This visit occurred during the SARS-CoV-2 public health emergency.  Safety protocols were in place, including screening questions prior to the visit, additional usage of staff PPE, and extensive cleaning of exam room while observing appropriate contact time as indicated for disinfecting solutions.   Cole Eastridge M Demarques Pilz, PA-C

## 2020-04-22 NOTE — ED Triage Notes (Signed)
Pt here from PCP for further eval of intermittent L sided chest pain with radiation to back onset this morning at 0800. Also endorses intermittent dizziness and high bp. 324 ASA given by EMS.

## 2020-04-22 NOTE — ED Notes (Signed)
Patient transported to X-ray 

## 2020-04-22 NOTE — Telephone Encounter (Signed)
Patient is being seen today  Nurse Assessment Nurse: Zorita Pang, RN, Neoma Laming Date/Time (Eastern Time): 04/22/2020 11:27:39 AM Confirm and document reason for call. If symptomatic, describe symptoms. ---Caller states that she has severe dizziness and nose bleeds. She thought that it was due to her blood pressure. But her BP is good and WNL. Does the patient have any new or worsening symptoms? ---Yes Will a triage be completed? ---Yes Related visit to physician within the last 2 weeks? ---No Does the PT have any chronic conditions? (i.e. diabetes, asthma, this includes High risk factors for pregnancy, etc.) ---Yes List chronic conditions. ---kidney disease stage 3, EKG changes Takes lisinopril/HCTZ 10/12.5 and Spironolactone 25 mg Is the patient pregnant or possibly pregnant? (Ask all females between the ages of 71-55) ---No Is this a behavioral health or substance abuse call? ---No Guidelines Guideline Title Affirmed Question Affirmed Notes Nurse Date/Time (Eastern Time) Dizziness - Lightheadedness [1] MODERATE dizziness (e.g., interferes with normal activities) AND [2] has NOT been evaluated by physician for this (Exception: dizziness caused by heat exposure, sudden standing, or poor fluid intake) Womble, RN, Neoma Laming 04/22/2020 11:31:40 AM PLEASE NOTE: All timestamps contained within this report are represented as [place] Standard Time. CONFIDENTIALTY NOTICE: This fax transmission is intended only for the addressee. It contains information that is legally privileged, confidential or otherwise protected from use or disclosure. If you are not the intended recipient, you are strictly prohibited from reviewing, disclosing, copying using or disseminating any of this information or taking any action in reliance on or regarding this information. If you have received this fax in error, please notify us immediately by telephone so that we can arrange for its return to [place]. Phone: [phone],  Toll-Free: [phone], Fax: [phone] Page: 2 of 2 Call Id: [id number] [place]. Time Eilene Ghazi Time) Disposition Final User 04/22/2020 11:42:34 AM See PCP within 24 Hours Yes Zorita Pang, RN, Garrel Ridgel Disagree/Comply Comply Caller Understands Yes PreDisposition Call Doctor Care Advice Given Per Guideline SEE PCP WITHIN 24 HOURS: * You become worse Referrals Warm transfer to backline

## 2020-04-22 NOTE — ED Notes (Signed)
Ambulated to B R and back to bed without difficulty. Remains asymptomatic.

## 2020-04-22 NOTE — Discharge Instructions (Addendum)
You were evaluated in the emergency department for chest pain.    Your work-up in the emergency department was unremarkable  I spoke to cardiology service and Dr. Ellyn Hack has talked to Dr. Radford Pax.  She deemed you appropriate for discharge but has added a Myoview stress test.  Please call her office tomorrow to confirm all the orders have been placed.  Please return to ED if: Your chest pain is worse or on exertion You have a cough that gets worse, or you cough up blood. You have severe pain in chest, back or abdomen. You have chest pain with loss of sensation, weakness or tingling in extremities You have chest pain or shortness of breath with exertion or activity You have sudden, unexplained discomfort in your chest, with radiation arms, back, neck, or jaw. You suddenly have chest pain and begin to sweat, or your skin gets clammy. You feel chest pain with nausea or vomiting, blood in vomit You suddenly feel light-headed or faint. Your heart begins to beat quickly, or it feels like it is skipping beats. You have one sided leg swelling or calf pain You have chest pain with fever, chills, cough or other viral symptoms

## 2020-04-24 ENCOUNTER — Telehealth: Payer: Self-pay

## 2020-04-24 DIAGNOSIS — R079 Chest pain, unspecified: Secondary | ICD-10-CM

## 2020-04-24 NOTE — Telephone Encounter (Signed)
-----   Message from Sueanne Margarita, MD sent at 04/22/2020  6:14 PM EDT ----- Patient seen in ER with atypical CP - please order Lexiscan myoview outpt

## 2020-04-24 NOTE — Telephone Encounter (Signed)
Spoke with the patient and advised her that Dr. Radford Pax would like her to have a stress test. I have gone over instructions with her and advised that schedulers will be calling to set up. Patient verbalized understanding.

## 2020-04-27 NOTE — Telephone Encounter (Signed)
Shared Decision Making/Informed Consent  The risks [chest pain, shortness of breath, cardiac arrhythmias, dizziness, blood pressure fluctuations, myocardial infarction, stroke/transient ischemic attack, nausea, vomiting, allergic reaction, radiation exposure, metallic taste sensation and life-threatening complications (estimated to be 1 in 10,000)], benefits (risk stratification, diagnosing coronary artery disease, treatment guidance) and alternatives of a nuclear stress test were discussed in detail with Tina Richardson and she agrees to proceed.

## 2020-04-27 NOTE — Telephone Encounter (Signed)
done

## 2020-04-28 ENCOUNTER — Ambulatory Visit (HOSPITAL_COMMUNITY): Payer: Managed Care, Other (non HMO) | Attending: Cardiology

## 2020-04-28 ENCOUNTER — Other Ambulatory Visit: Payer: Self-pay

## 2020-04-28 ENCOUNTER — Ambulatory Visit (INDEPENDENT_AMBULATORY_CARE_PROVIDER_SITE_OTHER)
Admission: RE | Admit: 2020-04-28 | Discharge: 2020-04-28 | Disposition: A | Discharge status: Home / Self Care | Payer: Self-pay | Source: Ambulatory Visit | Attending: Cardiology | Admitting: Cardiology

## 2020-04-28 ENCOUNTER — Telehealth: Payer: Self-pay

## 2020-04-28 DIAGNOSIS — I340 Nonrheumatic mitral (valve) insufficiency: Secondary | ICD-10-CM

## 2020-04-28 DIAGNOSIS — Z01810 Encounter for preprocedural cardiovascular examination: Secondary | ICD-10-CM | POA: Insufficient documentation

## 2020-04-28 DIAGNOSIS — R9431 Abnormal electrocardiogram [ECG] [EKG]: Secondary | ICD-10-CM | POA: Insufficient documentation

## 2020-04-28 DIAGNOSIS — R079 Chest pain, unspecified: Secondary | ICD-10-CM

## 2020-04-28 LAB — ECHOCARDIOGRAM COMPLETE
Area-P 1/2: 4.26 cm2
S' Lateral: 2.5 cm

## 2020-04-28 NOTE — Telephone Encounter (Signed)
The patient has been notified of the result and verbalized understanding.  All questions (if any) were answered. Antonieta Iba, RN 04/28/2020 2:16 PM

## 2020-04-28 NOTE — Telephone Encounter (Signed)
[facility] is calling stating they faxed over a medical clearance on 03/16/20 and they have yet to receive anything. Fax number is [phone].

## 2020-04-28 NOTE — Telephone Encounter (Addendum)
[place] and spoke to [place]. Told her pt was seen and referred to Cardiology for clearance and paper was given back to pt. Tina Richardson verbalized understanding and said she will make note in patients chart.

## 2020-04-28 NOTE — Telephone Encounter (Signed)
-----   Message from Sueanne Margarita, MD sent at 04/28/2020  2:00 PM EDT ----- Increased coronary Ca score of 40.5 but in high percentile for age and sex.  Needs LDL<70.  LDL was 151 in march  07/2020 and statin dose was increased.  Please repeat FLP and ALT  in 3 weeks

## 2020-04-29 ENCOUNTER — Telehealth (HOSPITAL_COMMUNITY): Payer: Self-pay | Admitting: *Deleted

## 2020-04-29 NOTE — Telephone Encounter (Signed)
Left message on voicemail per DPR in reference to upcoming appointment scheduled on 05/06/20 with detailed instructions given per Myocardial Perfusion Study Information Sheet for the test. LM to arrive 15 minutes early, and that it is imperative to arrive on time for appointment to keep from having the test rescheduled. If you need to cancel or reschedule your appointment, please call the office within 24 hours of your appointment. Failure to do so may result in a cancellation of your appointment, and a $50 no show fee. Phone number given for call back for any questions. Kirstie Peri

## 2020-05-06 ENCOUNTER — Other Ambulatory Visit: Payer: Self-pay

## 2020-05-06 ENCOUNTER — Ambulatory Visit (HOSPITAL_COMMUNITY): Payer: Managed Care, Other (non HMO) | Attending: Internal Medicine

## 2020-05-06 DIAGNOSIS — R079 Chest pain, unspecified: Secondary | ICD-10-CM | POA: Diagnosis present

## 2020-05-06 MED ORDER — REGADENOSON 0.4 MG/5ML IV SOLN
0.4000 mg | Freq: Once | INTRAVENOUS | Status: AC
  Administered 2020-05-06: 0.4 mg via INTRAVENOUS

## 2020-05-06 MED ORDER — TECHNETIUM TC 99M TETROFOSMIN IV KIT
31.3000 | PACK | Freq: Once | INTRAVENOUS | Status: AC | PRN
  Administered 2020-05-06: 31.3 via INTRAVENOUS
  Filled 2020-05-06: qty 32

## 2020-05-07 ENCOUNTER — Ambulatory Visit (HOSPITAL_COMMUNITY): Payer: Managed Care, Other (non HMO) | Attending: Internal Medicine

## 2020-05-07 LAB — MYOCARDIAL PERFUSION IMAGING
LV dias vol: 69 mL (ref 46–106)
LV sys vol: 25 mL
Peak HR: 134 {beats}/min
Rest HR: 78 {beats}/min
SDS: 1
SRS: 0
SSS: 1
TID: 1

## 2020-05-07 MED ORDER — TECHNETIUM TC 99M TETROFOSMIN IV KIT
31.5000 | PACK | Freq: Once | INTRAVENOUS | Status: AC | PRN
  Administered 2020-05-07: 31.5 via INTRAVENOUS
  Filled 2020-05-07: qty 32

## 2020-05-25 ENCOUNTER — Ambulatory Visit (HOSPITAL_COMMUNITY): Payer: Managed Care, Other (non HMO)

## 2020-05-26 ENCOUNTER — Ambulatory Visit (HOSPITAL_COMMUNITY): Payer: Managed Care, Other (non HMO)

## 2020-05-27 ENCOUNTER — Other Ambulatory Visit: Payer: Managed Care, Other (non HMO)

## 2020-06-01 ENCOUNTER — Ambulatory Visit (HOSPITAL_BASED_OUTPATIENT_CLINIC_OR_DEPARTMENT_OTHER): Payer: Managed Care, Other (non HMO) | Attending: Cardiology | Admitting: Cardiology

## 2020-07-01 ENCOUNTER — Telehealth: Payer: Self-pay | Admitting: Cardiology

## 2020-07-01 NOTE — Telephone Encounter (Signed)
Patient is following up regarding a clearance request from [facility]. She states the clearance was for a tooth extraction and faxed to the office in April. She would like to know if it has been received. I confirmed our fax number and informed her the fax may not have been received as nothing has been documented. Phone # for [facility] is [phone].

## 2020-07-01 NOTE — Telephone Encounter (Signed)
   [facility] Pre-operative Risk Assessment    Patient Name: Tina Richardson  DOB: Nov 13, 1970  MRN: [id number]   HEARTCARE STAFF: - Please ensure there is not already an duplicate clearance open for this procedure. - Under Visit Info/Reason for Call, type in Other and utilize the format Clearance MM/DD/YY or Clearance TBD. Do not use dashes or single digits. - If request is for dental extraction, please clarify the # of teeth to be extracted. - If the patient is currently at the dentist's office, call Pre-Op APP to address. If the patient is not currently in the dentist office, please route to the Pre-Op pool  Request for surgical clearance:  1. What type of surgery is being performed? EXTRACTION OF ALL 4 WISDOM TEETH   2. When is this surgery scheduled? TBD   3. What type of clearance is required (medical clearance vs. Pharmacy clearance to hold med vs. Both)? MEDICAL  4. Are there any medications that need to be held prior to surgery and how long? NONE LISTED    5. Practice name and name of physician performing surgery? [facility]; DR. Tamela Oddi, DDS   6. What is the office phone number? [phone]   7.   What is the office fax number? [phone]  8.   Anesthesia type (None, local, MAC, general) ? GENERAL   Julaine Hua 07/01/2020, 12:22 PM  _________________________________________________________________   (provider comments below)

## 2020-07-02 NOTE — Telephone Encounter (Signed)
I will have to send message to our HIM Dept to send records being requested. Our HIM Dept may need to reach out to Dental office to obtain the request on letter head.

## 2020-07-02 NOTE — Telephone Encounter (Signed)
Tina Richardson with Dr. Dorian Heckle office is following up.  She states the recommendation has been received and she is also requesting patient's most recent office visit notes/lab results.  Phone #: [phone] Fax #: [phone]

## 2020-10-07 DIAGNOSIS — I10 Essential (primary) hypertension: Secondary | ICD-10-CM | POA: Diagnosis not present

## 2020-10-07 DIAGNOSIS — N183 Chronic kidney disease, stage 3 unspecified: Secondary | ICD-10-CM | POA: Diagnosis not present

## 2020-10-07 DIAGNOSIS — E669 Obesity, unspecified: Secondary | ICD-10-CM | POA: Diagnosis not present

## 2020-10-13 DIAGNOSIS — D23112 Other benign neoplasm of skin of right lower eyelid, including canthus: Secondary | ICD-10-CM | POA: Diagnosis not present

## 2020-10-13 DIAGNOSIS — H029 Unspecified disorder of eyelid: Secondary | ICD-10-CM | POA: Diagnosis not present

## 2020-12-15 ENCOUNTER — Telehealth: Payer: Self-pay

## 2020-12-15 NOTE — Telephone Encounter (Signed)
Letter has been sent to patient informing them that their sleep study has expired. Patient will need to call and schedule an office visit to re-evaluate the need for a sleep study.    

## 2021-04-14 DIAGNOSIS — I10 Essential (primary) hypertension: Secondary | ICD-10-CM | POA: Diagnosis not present

## 2021-04-14 DIAGNOSIS — E669 Obesity, unspecified: Secondary | ICD-10-CM | POA: Diagnosis not present

## 2021-04-14 DIAGNOSIS — N183 Chronic kidney disease, stage 3 unspecified: Secondary | ICD-10-CM | POA: Diagnosis not present

## 2021-04-14 LAB — COMPREHENSIVE METABOLIC PANEL
Albumin: 4.4 (ref 3.5–5.0)
Calcium: 9.6 (ref 8.7–10.7)
eGFR: 38

## 2021-04-14 LAB — IRON,TIBC AND FERRITIN PANEL
%SAT: 13
Ferritin: 86
Iron: 43
TIBC: 319
UIBC: 276

## 2021-04-14 LAB — BASIC METABOLIC PANEL
BUN: 24 — AB (ref 4–21)
CO2: 21 (ref 13–22)
Chloride: 106 (ref 99–108)
Creatinine: 1.6 — AB (ref 0.5–1.1)
Glucose: 96
Potassium: 4.5 mEq/L (ref 3.5–5.1)
Sodium: 144 (ref 137–147)

## 2021-04-14 LAB — CBC AND DIFFERENTIAL: Hemoglobin: 14.2 (ref 12.0–16.0)

## 2021-04-20 ENCOUNTER — Encounter: Payer: Self-pay | Admitting: Physician Assistant

## 2021-05-18 DIAGNOSIS — N183 Chronic kidney disease, stage 3 unspecified: Secondary | ICD-10-CM | POA: Diagnosis not present

## 2021-05-18 DIAGNOSIS — I10 Essential (primary) hypertension: Secondary | ICD-10-CM | POA: Diagnosis not present

## 2021-05-18 DIAGNOSIS — E669 Obesity, unspecified: Secondary | ICD-10-CM | POA: Diagnosis not present

## 2021-08-26 NOTE — Progress Notes (Signed)
Tina Richardson is a 51 y.o. female here for a follow up on anxiety.   History of Present Illness:   Chief Complaint  Patient presents with   Anxiety   Depression    HPI  Anxiety/Depression  Patient complain of increased anxiety symptoms for the past 3 months. She is currently not taking any medication for this issue. Has had lots of stress due to her work and this mostly related to her [employer]. States she has been getting extra work duties unfairly assigned to her at her job. Has had increased stress due to this and ended up having a panic attack at that time as well. She has seen NP, Shaune Pollack on 08/26/2021 for this issue. She was having difficulty concentrating at that time. She is currently looking to stay in her job. She was recommended counseling at that time. Patient is willing trial medication for her symptoms.    States that she has been feeling overwhelmed at work despite having great support system at home. Denies any other concerns. Denies SI/HI.   HTN Currently taking Zestoretic 20-12.5 daily and aldactone 25 mg BID with no complications. At home blood pressure readings are: checked regularly. States she is concerned about her kidney disease and would like to come off her medication. She would like to work on improving her diet for this issue. She is following up with Nephrologist on 09/07/2021. Patient denies chest pain, SOB, blurred vision, dizziness, unusual headaches, lower leg swelling. Patient is  compliant with medication. Denies excessive caffeine intake, stimulant usage, excessive alcohol intake, or increase in salt consumption.  BP Readings from Last 3 Encounters:  08/27/21 (!) 120/90  04/22/20 128/79  04/22/20 (!) 149/98   Insomnia  Patient has been struggling to fall sleep for the past few months. States she wake up in the middle of night and then unable to go back to sleep. She usually wakes up around 1 am and does not fall sleep until 3 am. Has not tried any  medication for this issue. Does drink coffee regularly. Does not snore.Denies any other concerns.   Obesity  Patient has been dealing this for a while. She is currently looking for options on how to improve her diet. States she does not know how to start this. Never tried any medication for this. She would like to see nutritionist for this issue.She would like to try following healthy diet and exercise. Denies: lightheadedness, n/v, changes to urination/BM, unintentional weight changes.   Past Medical History:  Diagnosis Date   Anxiety    Gestational diabetes    diet controlled per patient   High-risk pregnancy    Hypertension    Multiparity      Social History   Tobacco Use   Smoking status: Never   Smokeless tobacco: Never  Vaping Use   Vaping Use: Never used  Substance Use Topics   Alcohol use: Yes    Comment: occasionally   Drug use: No    Past Surgical History:  Procedure Laterality Date   CESAREAN SECTION  02/06/2012   Procedure: CESAREAN SECTION;  Surgeon: Osborne Oman, MD;  Location: [facility];  Service: Obstetrics;  Laterality: N/A;  Primary Cesarean Section Delivery Baby Boy @ 0000, Apgars 8/9    NO PAST SURGERIES      Family History  Problem Relation Age of Onset   Diabetes Mother    Hypertension Father    Diabetes Father    COPD Father    Asthma Father  Breast cancer Paternal Grandmother    Other Neg Hx     Allergies  Allergen Reactions   Dilaudid [Hydromorphone Hcl] Other (See Comments)    Could not stop shaking   Norvasc [Amlodipine Besylate] Swelling    Leg swelling    Current Medications:   Current Outpatient Medications:    busPIRone (BUSPAR) 5 MG tablet, Take 1 tablet (5 mg total) by mouth 2 (two) times daily., Disp: 30 tablet, Rfl: 0   lisinopril-hydrochlorothiazide (ZESTORETIC) 20-12.5 MG tablet, Take 1 tablet by mouth daily., Disp: 90 tablet, Rfl: 1   spironolactone (ALDACTONE) 25 MG tablet, TAKE 1 TABLET(25 MG) BY MOUTH TWICE DAILY  (Patient taking differently: Take 25 mg by mouth 2 (two) times daily.), Disp: 180 tablet, Rfl: 1   atorvastatin (LIPITOR) 40 MG tablet, Take 1 tablet (40 mg total) by mouth daily. (Patient not taking: Reported on 04/22/2020), Disp: 90 tablet, Rfl: 1   Review of Systems:   ROS Negative unless otherwise specified per HPI.   Vitals:   Vitals:   08/27/21 0855 08/27/21 0931  BP: (!) 120/90 (!) 120/90  Pulse: 70   Temp: 98 F (36.7 C)   SpO2: 96%   Weight: 240 lb 9.6 oz (109.1 kg)   Height: '5\' 6"'$  (1.676 m)      Body mass index is 38.83 kg/m.  Physical Exam:   Physical Exam Vitals and nursing note reviewed.  Constitutional:      General: She is not in acute distress.    Appearance: She is well-developed. She is not ill-appearing or toxic-appearing.  Cardiovascular:     Rate and Rhythm: Normal rate and regular rhythm.     Pulses: Normal pulses.     Heart sounds: Normal heart sounds, S1 normal and S2 normal.  Pulmonary:     Effort: Pulmonary effort is normal.     Breath sounds: Normal breath sounds.  Skin:    General: Skin is warm and dry.  Neurological:     Mental Status: She is alert.     GCS: GCS eye subscore is 4. GCS verbal subscore is 5. GCS motor subscore is 6.  Psychiatric:        Speech: Speech normal.        Behavior: Behavior normal. Behavior is cooperative.     Assessment and Plan:   Situational mixed anxiety and depressive disorder Uncontrolled Denies SI/HI Talk therapy referral placed Start buspar 5 mg BID prn  Follow-up in 2 months, sooner if concerns I discussed with patient that if they develop any SI, to tell someone immediately and seek medical attention.  Obesity, unspecified classification, unspecified obesity type, unspecified whether serious comorbidity present Referral to RD  Primary hypertension Above goal Continue Zestoretic 20-12.5 daily and aldactone 25 mg BID Follow-up with nephro next week  Insomnia, unspecified  type Uncontrolled Reviewed sleep hygiene Consider trazodone at follow-up  I,Savera Zaman,acting as a scribe for [employer], PA.,have documented all relevant documentation on the behalf of Inda Coke, PA,as directed by  Inda Coke, PA while in the presence of Inda Coke, [place].   I, Inda Coke, [place], have reviewed all documentation for this visit. The documentation on 08/27/21 for the exam, diagnosis, procedures, and orders are all accurate and complete.   Inda Coke, PA-C

## 2021-08-27 ENCOUNTER — Ambulatory Visit (INDEPENDENT_AMBULATORY_CARE_PROVIDER_SITE_OTHER): Payer: BC Managed Care – PPO | Admitting: Physician Assistant

## 2021-08-27 ENCOUNTER — Encounter: Payer: Self-pay | Admitting: Physician Assistant

## 2021-08-27 VITALS — BP 120/90 | HR 70 | Temp 98.0°F | Ht 66.0 in | Wt 240.6 lb

## 2021-08-27 DIAGNOSIS — F4323 Adjustment disorder with mixed anxiety and depressed mood: Secondary | ICD-10-CM | POA: Diagnosis not present

## 2021-08-27 DIAGNOSIS — G4700 Insomnia, unspecified: Secondary | ICD-10-CM | POA: Diagnosis not present

## 2021-08-27 DIAGNOSIS — I10 Essential (primary) hypertension: Secondary | ICD-10-CM | POA: Diagnosis not present

## 2021-08-27 DIAGNOSIS — E669 Obesity, unspecified: Secondary | ICD-10-CM | POA: Diagnosis not present

## 2021-08-27 MED ORDER — BUSPIRONE HCL 5 MG PO TABS: 5.0000 mg | ORAL_TABLET | Freq: Two times a day (BID) | ORAL | 0 refills | Status: AC

## 2021-08-27 NOTE — Patient Instructions (Signed)
It was great to see you!  Start buspar 5 mg twice daily for anxiety  Referral to dietitian placed today  Continue your blood pressure medication as prescribed  Review sleep tips  Follow-up in 2-3 months, sooner if concerns       Sleep Hygiene  Do: (1) Go to bed at the same time each day. (2) Get up from bed at the same time each day. (3) Get regular exercise each day, preferably in the morning.  There is goof evidence that regular exercise improves restful sleep.  This includes stretching and aerobic exercise. (4) Get regular exposure to outdoor or bright lights, especially in the late afternoon. (5) Keep the temperature in your bedroom comfortable. (6) Keep the bedroom quiet when sleeping. (7) Keep the bedroom dark enough to facilitate sleep. (8) Use your bed only for sleep and sex. (9) Take medications as directed.  It is helpful to take prescribed sleeping pills 1 hour before bedtime, so they are causing drowsiness when you lie down, or 10 hours before getting up, to avoid daytime drowsiness. (10) Use a relaxation exercise just before going to sleep -- imagery, massage, warm bath. (11) Keep your feet and hands warm.  Wear warm socks and/or mittens or gloves to bed.  Don't: (1) Exercise just before going to bed. (2) Engage in stimulating activity just before bed, such as playing a competitive game, watching an exciting program on television, or having an important discussion with a loved one. (3) Have caffeine in the evening (coffee, teas, chocolate, sodas, etc.) (4) Read or watch television in bed. (5) Use alcohol to help you sleep. (6) Go to bed too hungry or too full. (7) Take another person's sleeping pills. (8) Take over-the-counter sleeping pills, without your doctor's knowledge.  Tolerance can develop rapidly with these medications.  Diphenhydramine can have serious side effects for elderly patients. (9) Take daytime naps. (10) Command yourself to go to sleep.  This  only makes your mind and body more alert.  If you lie awake for more than 20-30 minutes, get up, go to a different room, participate in a quiet activity (Ex - non-excitable reading or television), and then return to bed when you feel sleepy.  Do this as many times during the night as needed.  This may cause you to have a night or two of poor sleep but it will train your brain to know when it is time for sleep.

## 2021-09-02 ENCOUNTER — Encounter: Payer: BC Managed Care – PPO | Admitting: Nurse Practitioner

## 2021-09-07 DIAGNOSIS — I10 Essential (primary) hypertension: Secondary | ICD-10-CM | POA: Diagnosis not present

## 2021-09-07 DIAGNOSIS — N183 Chronic kidney disease, stage 3 unspecified: Secondary | ICD-10-CM | POA: Diagnosis not present

## 2021-09-07 DIAGNOSIS — F419 Anxiety disorder, unspecified: Secondary | ICD-10-CM | POA: Diagnosis not present

## 2021-09-07 DIAGNOSIS — E669 Obesity, unspecified: Secondary | ICD-10-CM | POA: Diagnosis not present

## 2021-09-07 LAB — BASIC METABOLIC PANEL
BUN: 22 — AB (ref 4–21)
CO2: 20 (ref 13–22)
Chloride: 106 (ref 99–108)
Creatinine: 1.5 — AB (ref 0.5–1.1)
Glucose: 96
Potassium: 4.3 mEq/L (ref 3.5–5.1)
Sodium: 141 (ref 137–147)

## 2021-09-07 LAB — COMPREHENSIVE METABOLIC PANEL
Albumin: 4.3 (ref 3.5–5.0)
Calcium: 9.1 (ref 8.7–10.7)
eGFR: 41

## 2021-09-07 LAB — IRON,TIBC AND FERRITIN PANEL
%SAT: 20
Ferritin: 112
Iron: 64
TIBC: 313
UIBC: 249

## 2021-09-07 LAB — PROTEIN / CREATININE RATIO, URINE: Creatinine, Urine: 75.1

## 2021-09-07 LAB — CBC AND DIFFERENTIAL: Hemoglobin: 12.8 (ref 12.0–16.0)

## 2021-09-09 DIAGNOSIS — M5137 Other intervertebral disc degeneration, lumbosacral region: Secondary | ICD-10-CM | POA: Diagnosis not present

## 2021-09-09 DIAGNOSIS — M5441 Lumbago with sciatica, right side: Secondary | ICD-10-CM | POA: Diagnosis not present

## 2021-09-09 DIAGNOSIS — M9905 Segmental and somatic dysfunction of pelvic region: Secondary | ICD-10-CM | POA: Diagnosis not present

## 2021-09-09 DIAGNOSIS — M5386 Other specified dorsopathies, lumbar region: Secondary | ICD-10-CM | POA: Diagnosis not present

## 2021-09-09 DIAGNOSIS — M9903 Segmental and somatic dysfunction of lumbar region: Secondary | ICD-10-CM | POA: Diagnosis not present

## 2021-09-09 DIAGNOSIS — M9902 Segmental and somatic dysfunction of thoracic region: Secondary | ICD-10-CM | POA: Diagnosis not present

## 2021-10-15 ENCOUNTER — Encounter: Payer: Self-pay | Admitting: Physician Assistant

## 2021-10-20 ENCOUNTER — Ambulatory Visit (INDEPENDENT_AMBULATORY_CARE_PROVIDER_SITE_OTHER): Payer: BC Managed Care – PPO | Admitting: Internal Medicine

## 2021-10-20 ENCOUNTER — Encounter: Payer: Self-pay | Admitting: Internal Medicine

## 2021-10-20 ENCOUNTER — Telehealth: Payer: Self-pay | Admitting: Physician Assistant

## 2021-10-20 ENCOUNTER — Ambulatory Visit: Payer: BC Managed Care – PPO | Admitting: Skilled Nursing Facility1

## 2021-10-20 VITALS — BP 107/72 | HR 67 | Temp 98.1°F | Resp 14 | Ht 66.0 in | Wt 236.6 lb

## 2021-10-20 DIAGNOSIS — E785 Hyperlipidemia, unspecified: Secondary | ICD-10-CM | POA: Insufficient documentation

## 2021-10-20 DIAGNOSIS — I10 Essential (primary) hypertension: Secondary | ICD-10-CM | POA: Insufficient documentation

## 2021-10-20 DIAGNOSIS — E119 Type 2 diabetes mellitus without complications: Secondary | ICD-10-CM | POA: Insufficient documentation

## 2021-10-20 DIAGNOSIS — R197 Diarrhea, unspecified: Secondary | ICD-10-CM | POA: Diagnosis not present

## 2021-10-20 DIAGNOSIS — F439 Reaction to severe stress, unspecified: Secondary | ICD-10-CM | POA: Insufficient documentation

## 2021-10-20 DIAGNOSIS — R002 Palpitations: Secondary | ICD-10-CM | POA: Insufficient documentation

## 2021-10-20 DIAGNOSIS — E669 Obesity, unspecified: Secondary | ICD-10-CM | POA: Insufficient documentation

## 2021-10-20 DIAGNOSIS — F419 Anxiety disorder, unspecified: Secondary | ICD-10-CM | POA: Insufficient documentation

## 2021-10-20 DIAGNOSIS — R0602 Shortness of breath: Secondary | ICD-10-CM | POA: Insufficient documentation

## 2021-10-20 DIAGNOSIS — R202 Paresthesia of skin: Secondary | ICD-10-CM | POA: Insufficient documentation

## 2021-10-20 MED ORDER — ALUM & MAG HYDROXIDE-SIMETH 400-400-40 MG/5ML PO SUSP: 15.0000 mL | ORAL | Status: AC | PRN

## 2021-10-20 NOTE — Progress Notes (Signed)
[place] [facility]:  [phone]   Routine Medical Office Visit  Patient:  Tina Richardson      Age: 51 y.o.       Sex:  female  Date:   10/20/2021  PCP:    Inda Coke, [place] Provider: Loralee Pacas, MD  Assessment/Plan:   Jakerria was seen today for diarrhea.  Diarrhea of presumed infectious origin Assessment & Plan: She had hamburger steak at home with her family before it started but no one else in her family developed the diarrhea it is only been going on for 2 days but it is severe she feels that she is totally cleaned out and is associated with severe bloating gassiness but not really abdominal pain.  She has no history of irritable bowel syndrome or gastroenterological problems no history of bariatric surgery or abdominal surgery of any kind and no history of cancer. Watery almost like urine appearing stools coming out intestines Not Associated with blood   Orders: -     Osmolality, stool -     Sodium, stool -     Potassium, stool -     Ova and parasite examination -     Stool culture -     Clostridium Difficile by PCR; Future -     Alum & Mag Hydroxide-Simeth     Return if symptoms worsen or fail to improve.   Common side effects, risks, benefits, and alternatives for medications and treatment plan prescribed today were discussed, and she expressed understanding of the given instructions.  Medication list was reconciled and provided to the patient in the AVS. Patient instructions and summary information was reviewed with her as documented in the AVS.  Patient is instructed to call or message via MyChart if she has any questions or concerns regarding our treatment plan.  No barriers to understanding were identified.  We discussed Red Flag symptoms and signs in detail. She expressed understanding regarding what to do in case of urgent or emergency type symptoms.  She was advised to call the office or go to ER if her  condition worsens. Additional information was provided in the AVS (see AVS) regarding the diagnosis/treatment plan for her to review    Subjective:   Tina Richardson is a 51 y.o. female with PMH significant for: Past Medical History:  Diagnosis Date   Anxiety    Diarrhea 10/20/2021   Gestational diabetes    diet controlled per patient   High-risk pregnancy    Hypertension    Multiparity      She main concern for today's visit is: Chief Complaint  Patient presents with   Diarrhea    Since Monday. Took pepto bismol with no relief and apple cider vinegar with some relief.     Additional physician collected history: 2 days Watery almost like urine appearing stools coming out intestines Not Associated with blood Denies any nausea or vomiting or any blood in the stool She has taken recent antibiotics as part of treatment for a UTI She has not been out of the country had any recent travel She drinks city water       Objective:  Physical Exam: BP 107/72 (BP Location: Left Arm, Patient Position: Sitting)   Pulse 67   Temp 98.1 F (36.7 C) (Temporal)   Resp 14   Ht '5\' 6"'  (1.676 m)   Wt 236 lb 9.6 oz (107.3 kg)   LMP 03/24/2017 (Approximate)   SpO2 95%  BMI 38.19 kg/m   She  is a polite, friendly, and genuine person Constitutional: NAD, AAO, not ill-appearing  Neuro: alert, no focal deficit obvious, articulate speech Psych: normal mood, behavior, thought content   Problem specific physical exam findings:  No abd pain but she feels bloatied with pressue Does not look ill  Tongue is moist  Results:  No results found for any visits on 10/20/21.   Recent Results (from the past 2160 hour(s))  Iron, TIBC and Ferritin Panel     Status: None   Collection Time: 09/07/21 12:00 AM  Result Value Ref Range   Ferritin 112    Iron 64    TIBC 313    UIBC 249    %SAT 20   CBC and differential     Status: None   Collection Time: 09/07/21 12:00 AM  Result Value Ref Range    Hemoglobin 12.8 12.0 - 16.0  Protein / creatinine ratio, urine     Status: None   Collection Time: 09/07/21 12:00 AM  Result Value Ref Range   Creatinine, Urine 20.1   Basic metabolic panel     Status: Abnormal   Collection Time: 09/07/21 12:00 AM  Result Value Ref Range   Glucose 96    BUN 22 (A) 4 - 21   CO2 20 13 - 22   Creatinine 1.5 (A) 0.5 - 1.1   Potassium 4.3 3.5 - 5.1 mEq/L   Sodium 141 137 - 147   Chloride 106 99 - 108  Comprehensive metabolic panel     Status: None   Collection Time: 09/07/21 12:00 AM  Result Value Ref Range   eGFR 41    Calcium 9.1 8.7 - 10.7   Albumin 4.3 3.5 - 5.0

## 2021-10-20 NOTE — Assessment & Plan Note (Signed)
She had hamburger steak at home with her family before it started but no one else in her family developed the diarrhea it is only been going on for 2 days but it is severe she feels that she is totally cleaned out and is associated with severe bloating gassiness but not really abdominal pain.  She has no history of irritable bowel syndrome or gastroenterological problems no history of bariatric surgery or abdominal surgery of any kind and no history of cancer. Watery almost like urine appearing stools coming out intestines Not Associated with blood

## 2021-10-20 NOTE — Telephone Encounter (Signed)
Patient Name: Tina Richardson Gender: Female DOB: 07/04/1970 Age: 51 Y 11 M 8 D Return Phone Number: [phone] (Primary) Address: City/ State/ Zip: [place]  [address] Client [place] [facility] [place] Client Site [place] [facility] Provider Tina Richardson, [place]- [place] Contact Type Call Who Is Calling Patient / Member / Family / Caregiver Call Type Triage / Clinical Relationship To Patient Self Return Phone Number [phone] (Primary) Chief Complaint Diarrhea Reason for Call Symptomatic / Request for [place] states she has had severe diarrhea for the past 3 days after eating a brownie at work. She also heard that is a recall on meat. Her stool is green and liquid Translation No Nurse Assessment Nurse: Tina Richardson Date/Time (Eastern Time): 10/20/2021 11:40:36 AM Confirm and document reason for call. If symptomatic, describe symptoms. ---caller states she has severe diarrhea for the last 3 days. Does the patient have any new or worsening symptoms? ---Yes Will a triage be completed? ---Yes Related visit to physician within the last 2 weeks? ---No Does the PT have any chronic conditions? (i.e. diabetes, asthma, this includes High risk factors for pregnancy, etc.) ---Yes List chronic conditions. ---CKD stage 3 and HTN Is the patient pregnant or possibly pregnant? (Ask all females between the ages of 62-55) ---No Is this a behavioral health or substance abuse call? ---No Guidelines Guideline Title Affirmed Question Affirmed Notes Nurse Date/Time (Eastern Time) Diarrhea Abdominal pain (Exception: Pain clears with each passage of diarrhea stool.) Tina Richardson 10/20/2021 11:42:56 AM PLEASE NOTE: All timestamps contained within this report are represented as [place] Standard Time. CONFIDENTIALTY NOTICE: This fax transmission is intended only for the addressee. It  contains information that is legally privileged, confidential or otherwise protected from use or disclosure. If you are not the intended recipient, you are strictly prohibited from reviewing, disclosing, copying using or disseminating any of this information or taking any action in reliance on or regarding this information. If you have received this fax in error, please notify us immediately by telephone so that we can arrange for its return to [place]. Phone: [phone], Toll-Free: [phone], Fax: [phone] Page: 2 of 2 Call Id: [id number] [place]. Time Eilene Ghazi Time) Disposition Final User 10/20/2021 11:50:07 AM See PCP within 24 Hours Yes Tina Richardson Final Disposition 10/20/2021 11:50:07 AM See PCP within 24 Hours Yes [place], RN, Tina Richardson Disagree/Comply Comply Caller Understands Yes PreDisposition InappropriateToAsk Care Advice Given Per Guideline SEE PCP WITHIN 24 HOURS: * IF OFFICE WILL BE OPEN: You need to be examined within the next 24 hours. Call your doctor (or NP/PA) when the office opens and make an appointment. CARE ADVICE given per Diarrhea (Adult) guideline. * You become worse CALL BACK IF: CLEAR FLUIDS: * Drink more fluids. * Sip water or a half-strength sports drink (e.g., Gatorade, Powerade; mix half and half with water). Referrals REFERRED TO PCP OFFICE

## 2021-10-20 NOTE — Telephone Encounter (Signed)
Patient states: - She has been experiencing severe diarrhea for the past 3 days  - It started after she ate a brownie from work  - Occurs an hour after eating something  - Stool is "Liquidy" and green- Got so bad she went to bathroom 6 times last night  - No other sx   Patient has been transferred to triage.

## 2021-10-20 NOTE — Patient Instructions (Addendum)
It was a pleasure seeing you today!  Today the plan is...  Diarrhea of presumed infectious origin -     Osmolality, stool -     Sodium, stool -     Potassium, stool -     Ova and parasite examination -     Stool culture -     Clostridium Difficile by PCR; Future    Hydrated with electrolyte containing fluids or go to ER if you cannot and take a brat diet to make sure that there is no lactose or gluten in your food in case that is contributing to the diarrhea.  My overall impression is that this probably is infectious but I have no idea what is the thing virus bacteria or parasite and I really cannot even confirm its infectious Loralee Pacas, MD   Return if symptoms worsen or fail to improve.   - If your condition fails to resolve or you have other questions / concerns: please contact me via phone [phone] or MyChart messaging.  - Please bring all your medicines to your next appointment. This is the best way for me to know exactly what you're taking.  - If your condition begins to worsen or become severe:  go to the ER.   IF you received an x-ray today, you will receive an invoice from [facility]. Please contact [facility] at [phone] with questions or concerns regarding your invoice.    IF you received labwork today, you will receive an invoice from [place] [facility]. Please contact [facility] at [phone] with questions or concerns regarding your invoice.    Our billing staff will not be able to assist you with questions regarding bills from these companies.   --------------------------------------------------------------------------------------------------------------------  You will be contacted with the lab results as soon as they are available. The fastest way to get your results is to activate your My Chart account. Instructions are located on the last page of this paperwork. If you have not heard from [place] regarding the results in 2 weeks, please contact  this office. For any labs or imaging tests, we will call you if the results are significantly abnormal.  Most normal results will be posted to myChart as soon as they are available and I will comment on them there within 2-3 business days.

## 2021-10-20 NOTE — Telephone Encounter (Signed)
FYI

## 2021-11-10 ENCOUNTER — Ambulatory Visit: Payer: Managed Care, Other (non HMO) | Admitting: Physician Assistant

## 2021-11-25 ENCOUNTER — Ambulatory Visit: Payer: BC Managed Care – PPO | Admitting: Skilled Nursing Facility1

## 2022-02-14 ENCOUNTER — Encounter: Payer: Self-pay | Admitting: Emergency Medicine

## 2022-02-14 ENCOUNTER — Ambulatory Visit
Admission: EM | Admit: 2022-02-14 | Discharge: 2022-02-14 | Disposition: A | Discharge status: Home / Self Care | Payer: No Typology Code available for payment source | Attending: Nurse Practitioner | Admitting: Nurse Practitioner

## 2022-02-14 ENCOUNTER — Ambulatory Visit (INDEPENDENT_AMBULATORY_CARE_PROVIDER_SITE_OTHER): Payer: No Typology Code available for payment source

## 2022-02-14 DIAGNOSIS — R079 Chest pain, unspecified: Secondary | ICD-10-CM | POA: Diagnosis not present

## 2022-02-14 DIAGNOSIS — R059 Cough, unspecified: Secondary | ICD-10-CM | POA: Diagnosis not present

## 2022-02-14 DIAGNOSIS — R051 Acute cough: Secondary | ICD-10-CM

## 2022-02-14 DIAGNOSIS — J208 Acute bronchitis due to other specified organisms: Secondary | ICD-10-CM | POA: Diagnosis not present

## 2022-02-14 DIAGNOSIS — I10 Essential (primary) hypertension: Secondary | ICD-10-CM

## 2022-02-14 DIAGNOSIS — U071 COVID-19: Secondary | ICD-10-CM

## 2022-02-14 MED ORDER — PROMETHAZINE-DM 6.25-15 MG/5ML PO SYRP: 10.0000 mL | ORAL_SOLUTION | Freq: Four times a day (QID) | ORAL | 0 refills | Status: AC | PRN

## 2022-02-14 MED ORDER — METHYLPREDNISOLONE 4 MG PO TBPK: ORAL_TABLET | ORAL | 0 refills | Status: AC

## 2022-02-14 MED ORDER — MUCINEX DM MAXIMUM STRENGTH 60-1200 MG PO TB12: 1.0000 | ORAL_TABLET | Freq: Two times a day (BID) | ORAL | 0 refills | Status: AC

## 2022-02-14 MED ORDER — DOXYCYCLINE HYCLATE 100 MG PO CAPS: 100.0000 mg | ORAL_CAPSULE | Freq: Two times a day (BID) | ORAL | 0 refills | Status: AC

## 2022-02-14 MED ORDER — ALBUTEROL SULFATE HFA 108 (90 BASE) MCG/ACT IN AERS: 2.0000 | INHALATION_SPRAY | RESPIRATORY_TRACT | 0 refills | Status: AC | PRN

## 2022-02-14 NOTE — ED Provider Notes (Signed)
[facility]    CSN: [id number] Arrival date & time: 02/14/22  1111      History   Chief Complaint Chief Complaint  Patient presents with   COVID positive    HPI Tina Richardson is a 52 y.o. female.   Subjective:   Tina Richardson is a 52 y.o. female who presents for evaluation of symptoms of a URI. Symptoms include vomiting, fatigue, subjective fevers, nausea, anorexia, diarrhea, headache and dizziness.  Onset of symptoms was 6 days ago and gradually worsening since that time.  Patient denies any loss of taste/smell, chills, body aches or nasal congestion.  She took a home COVID test 4 days ago which was positive.  Patient has a prior history of COVID.  She has had the COVID-vaccine x 2 but no boosters.  She has a history of hypertension, hyperlipidemia and anxiety.  Patient reports compliance with her prescribed therapies.  Patient now states that cough, chest tightness, chest congestion and dyspnea on exertion has been ongoing for the past 3 days.  Notably, the vomiting stopped 3 days ago. She is drinking plenty of fluids able to tolerate p.o. without difficulty.  The discomfort in the chest is described as a stinging sensation and worse with deep breathing.  Patient denies any leg pain or swelling.  No recent travel.  She has tried elderberry, home remedies and peppermint tea for her symptoms.  She does not smoke or vape.    The following portions of the patient's history were reviewed and updated as appropriate: allergies, current medications, past family history, past medical history, past social history, past surgical history, and problem list.          Past Medical History:  Diagnosis Date   Anxiety    Diarrhea 10/20/2021   Gestational diabetes    diet controlled per patient   High-risk pregnancy    Hypertension    Multiparity     Patient Active Problem List   Diagnosis Date Noted   Anxiety 10/20/2021   Hyperlipidemia 10/20/2021   Obesity (BMI  30-39.9) 10/20/2021   Palpitations 10/20/2021   Paresthesias 10/20/2021   SOB (shortness of breath) 10/20/2021   Stress reaction, emotional 10/20/2021   Type 2 diabetes mellitus without complication, without long-term current use of insulin ([place]) 10/20/2021   Essential hypertension 10/20/2021   Diarrhea 10/20/2021   Stage 3 chronic kidney disease ([place]) 06/12/2016   Abnormal EKG 06/11/2016   Malignant hypertension 06/11/2016   Gestational Diabetes 10/10/2011   BV (bacterial vaginosis) 08/31/2011    Past Surgical History:  Procedure Laterality Date   CESAREAN SECTION  02/06/2012   Procedure: CESAREAN SECTION;  Surgeon: Osborne Oman, MD;  Location: [facility];  Service: Obstetrics;  Laterality: N/A;  Primary Cesarean Section Delivery Baby Boy @ 0000, Apgars 8/9    NO PAST SURGERIES      OB History     Gravida  8   Para  8   Term  8   Preterm      AB      Living  8      SAB      IAB      Ectopic      Multiple      Live Births  2            Home Medications    Prior to Admission medications   Medication Sig Start Date End Date Taking? Authorizing Provider  albuterol (VENTOLIN HFA) 108 (90 Base) MCG/ACT inhaler Inhale  2 puffs into the lungs every 4 (four) hours as needed for wheezing or shortness of breath. 02/14/22  Yes Kaylanni Ezelle, Aldona Bar, FNP  Dextromethorphan-guaiFENesin (MUCINEX DM MAXIMUM STRENGTH) 60-1200 MG TB12 Take 1 tablet by mouth 2 (two) times daily. 02/14/22  Yes Enrique Sack, FNP  doxycycline (VIBRAMYCIN) 100 MG capsule Take 1 capsule (100 mg total) by mouth 2 (two) times daily. 02/14/22  Yes Enrique Sack, FNP  lisinopril-hydrochlorothiazide (ZESTORETIC) 20-12.5 MG tablet Take 1 tablet by mouth daily. 04/14/20  Yes Morene Rankins, Aldona Bar, PA  methylPREDNISolone (MEDROL DOSEPAK) 4 MG TBPK tablet Take as directed 02/14/22  Yes Enrique Sack, FNP  promethazine-dextromethorphan (PROMETHAZINE-DM) 6.25-15 MG/5ML syrup Take 10 mLs by mouth every 6  (six) hours as needed for cough. 02/14/22  Yes Enrique Sack, FNP  spironolactone (ALDACTONE) 25 MG tablet TAKE 1 TABLET(25 MG) BY MOUTH TWICE DAILY Patient taking differently: Take 25 mg by mouth 2 (two) times daily. 04/14/20  Yes Inda Coke, PA  atorvastatin (LIPITOR) 40 MG tablet Take 1 tablet (40 mg total) by mouth daily. Patient not taking: Reported on 04/22/2020 04/14/20   Inda Coke, PA  busPIRone (BUSPAR) 5 MG tablet Take 1 tablet (5 mg total) by mouth 2 (two) times daily. 08/27/21   Inda Coke, PA    Family History Family History  Problem Relation Age of Onset   Diabetes Mother    Hypertension Father    Diabetes Father    COPD Father    Asthma Father    Breast cancer Paternal Grandmother    Other Neg Hx     Social History Social History   Tobacco Use   Smoking status: Never   Smokeless tobacco: Never  Vaping Use   Vaping Use: Never used  Substance Use Topics   Alcohol use: Yes    Comment: occasionally   Drug use: No     Allergies   Dilaudid [hydromorphone hcl] and Norvasc [amlodipine besylate]   Review of Systems Review of Systems  Constitutional:  Positive for appetite change, fatigue and fever.  HENT:  Negative for congestion, rhinorrhea and sore throat.   Respiratory:  Positive for cough, chest tightness and shortness of breath.   Cardiovascular:  Negative for chest pain, palpitations and leg swelling.  Gastrointestinal:  Positive for diarrhea, nausea and vomiting.  Musculoskeletal:  Negative for myalgias.  Neurological:  Positive for dizziness and headaches.  All other systems reviewed and are negative.    Physical Exam Triage Vital Signs ED Triage Vitals  Enc Vitals Group     BP 02/14/22 1324 (!) 179/111     Pulse Rate 02/14/22 1324 60     Resp 02/14/22 1324 18     Temp 02/14/22 1324 97.9 F (36.6 C)     Temp Source 02/14/22 1324 Oral     SpO2 02/14/22 1324 98 %     Weight --      Height 02/14/22 1326 5' 6.75" (1.695 m)      Head Circumference --      Peak Flow --      Pain Score 02/14/22 1326 0     Pain Loc --      Pain Edu? --      Excl. in [place]? --    No data found.  Updated Vital Signs BP (!) 179/111 (BP Location: Right Arm)   Pulse 60   Temp 97.9 F (36.6 C) (Oral)   Resp 18   Ht 5' 6.75" (1.695 m)   LMP 03/24/2017 (Approximate)   SpO2 98%  BMI 37.33 kg/m   Visual Acuity Right Eye Distance:   Left Eye Distance:   Bilateral Distance:    Right Eye Near:   Left Eye Near:    Bilateral Near:     Physical Exam Vitals reviewed.  Constitutional:      General: She is not in acute distress.    Appearance: Normal appearance. She is not ill-appearing, toxic-appearing or diaphoretic.  HENT:     Head: Normocephalic.     Nose: Nose normal.     Mouth/Throat:     Mouth: Mucous membranes are moist.  Eyes:     Conjunctiva/sclera: Conjunctivae normal.  Cardiovascular:     Rate and Rhythm: Normal rate and regular rhythm.  Pulmonary:     Effort: Pulmonary effort is normal. No respiratory distress.     Breath sounds: Normal breath sounds. No wheezing.  Musculoskeletal:        General: Normal range of motion.     Cervical back: Normal range of motion and neck supple.  Lymphadenopathy:     Cervical: No cervical adenopathy.  Skin:    General: Skin is warm and dry.  Neurological:     General: No focal deficit present.     Mental Status: She is alert and oriented to person, place, and time.      UC Treatments / Results  Labs (all labs ordered are listed, but only abnormal results are displayed) Labs Reviewed - No data to display  EKG   Radiology DG Chest 2 View  Result Date: 02/14/2022 CLINICAL DATA:  Cough and chest tightness.  COVID positive. EXAM: CHEST - 2 VIEW COMPARISON:  Chest x-ray dated April 22, 2020. FINDINGS: The heart size and mediastinal contours are within normal limits. Both lungs are clear. The visualized skeletal structures are unremarkable. IMPRESSION: No active  cardiopulmonary disease. Electronically Signed   By: Titus Dubin M.D.   On: 02/14/2022 14:17    Procedures Procedures (including critical care time)  Medications Ordered in UC Medications - No data to display  Initial Impression / Assessment and Plan / UC Course  I have reviewed the triage vital signs and the nursing notes.  Pertinent labs & imaging results that were available during my care of the patient were reviewed by me and considered in my medical decision making (see chart for details).    52 yo female that presents vomiting, fatigue, subjective fevers, nausea, anorexia, diarrhea, headache, dizziness, cough, chest tightness, chest congestion and dyspnea on exertion. Positive home COVID test 4 days ago. Patient is afebrile and nontoxic. Hypertensive with a BP 179/111. No evidence of hypertensive urgency or hypertensive emergency. CXR for any acute findings.  Most likely due to acute bronchitis secondary to COVID-19 infection.  Prescriptions as below.  Supportive care measures and indications for immediate follow-up extensively discussed with patient.  Today's evaluation has revealed no signs of a dangerous process. Discussed diagnosis with patient and/or guardian. Patient and/or guardian aware of their diagnosis, possible red flag symptoms to watch out for and need for close follow up. Patient and/or guardian understands verbal and written discharge instructions. Patient and/or guardian comfortable with plan and disposition.  Patient and/or guardian has a clear mental status at this time, good insight into illness (after discussion and teaching) and has clear judgment to make decisions regarding their care  Documentation was completed with the aid of voice recognition software. Transcription may contain typographical errors. Final Clinical Impressions(s) / UC Diagnoses   Final diagnoses:  Acute cough  Acute  bronchitis due to other specified organisms  COVID-19  Essential  hypertension     Discharge Instructions      Your chest x-ray did not show any pneumonia or other concerning findings.  You likely have acute bronchitis due to the COVID infection.  Bronchitis is a sudden inflammation of the main airways that come off the windpipe in the lungs. The swelling causes the airways to get smaller and make more mucus than normal. This can make it hard to breathe and can cause coughing.  Drink more fluids to help thin your mucus so it is easier to cough up. Use inhaler as needed to improve air flow in and out of your lungs. Use a vaporizer or a humidifier in your bedroom at night.  Takie cough medicine as prescribed as this will help thin mucus, clear congestion and help with cough  Try to take two teaspoons of honey at bedtime to lessen coughing at night. Do not use any products that contain nicotine or tobacco.  Get plenty of rest.  Go to the ED immediately if you:  Cough up blood. Feel pain in your chest. Have severe shortness of breath. Faint or keep feeling like you are going to faint. Have a severe headache. Have a fever or chills that get worse.  Your blood pressure was elevated today.  Managing your hypertension is very important. Over time, hypertension can damage the arteries and decrease blood flow to parts of the body, including the brain, heart, and kidneys. Having untreated or uncontrolled hypertension can lead to  heart attack, stroke, weakened blood vessels (aneurysm), heart failure, kidney damage, eye damage, memory/concentration problems and vascular dementia. Please monitor your blood pressure closely and take your medications as prescribed. Go to the ED immediately if you develop a severe headache, dizziness, sudden vision problems, confusion, experience unusual weakness or numbness, severe pain in your chest or abdomen, you vomit repeatedly or have trouble breathing.       ED Prescriptions     Medication Sig Dispense Auth. Provider    doxycycline (VIBRAMYCIN) 100 MG capsule Take 1 capsule (100 mg total) by mouth 2 (two) times daily. 14 capsule Marigene Erler, Aldona Bar, FNP   methylPREDNISolone (MEDROL DOSEPAK) 4 MG TBPK tablet Take as directed 21 tablet Enrique Sack, FNP   albuterol (VENTOLIN HFA) 108 (90 Base) MCG/ACT inhaler Inhale 2 puffs into the lungs every 4 (four) hours as needed for wheezing or shortness of breath. 18 g Enrique Sack, FNP   promethazine-dextromethorphan (PROMETHAZINE-DM) 6.25-15 MG/5ML syrup Take 10 mLs by mouth every 6 (six) hours as needed for cough. 118 mL Haedyn Breau, [place], FNP   Dextromethorphan-guaiFENesin (MUCINEX DM MAXIMUM STRENGTH) 60-1200 MG TB12 Take 1 tablet by mouth 2 (two) times daily. 20 tablet Enrique Sack, FNP      PDMP not reviewed this encounter.   Enrique Sack, [place] 02/14/22 [phone]

## 2022-02-14 NOTE — Discharge Instructions (Addendum)
Your chest x-ray did not show any pneumonia or other concerning findings.  You likely have acute bronchitis due to the COVID infection.  Bronchitis is a sudden inflammation of the main airways that come off the windpipe in the lungs. The swelling causes the airways to get smaller and make more mucus than normal. This can make it hard to breathe and can cause coughing.  Drink more fluids to help thin your mucus so it is easier to cough up. Use inhaler as needed to improve air flow in and out of your lungs. Use a vaporizer or a humidifier in your bedroom at night.  Takie cough medicine as prescribed as this will help thin mucus, clear congestion and help with cough  Try to take two teaspoons of honey at bedtime to lessen coughing at night. Do not use any products that contain nicotine or tobacco.  Get plenty of rest.  Go to the ED immediately if you:  Cough up blood. Feel pain in your chest. Have severe shortness of breath. Faint or keep feeling like you are going to faint. Have a severe headache. Have a fever or chills that get worse.  Your blood pressure was elevated today.  Managing your hypertension is very important. Over time, hypertension can damage the arteries and decrease blood flow to parts of the body, including the brain, heart, and kidneys. Having untreated or uncontrolled hypertension can lead to  heart attack, stroke, weakened blood vessels (aneurysm), heart failure, kidney damage, eye damage, memory/concentration problems and vascular dementia. Please monitor your blood pressure closely and take your medications as prescribed. Go to the ED immediately if you develop a severe headache, dizziness, sudden vision problems, confusion, experience unusual weakness or numbness, severe pain in your chest or abdomen, you vomit repeatedly or have trouble breathing.

## 2022-02-14 NOTE — ED Triage Notes (Signed)
Patient c/o that her chest feels tight, gets winded easy when going up stairs.  Patient tested positive for COVID 5 days ago.  Patient has taken a lot of OTC natural remedies and Tylenol.

## 2022-08-09 ENCOUNTER — Encounter (HOSPITAL_COMMUNITY): Payer: Self-pay

## 2022-08-09 ENCOUNTER — Other Ambulatory Visit: Payer: Self-pay

## 2022-08-09 ENCOUNTER — Emergency Department (HOSPITAL_COMMUNITY)
Admission: EM | Admit: 2022-08-09 | Discharge: 2022-08-09 | Disposition: A | Discharge status: Home / Self Care | Payer: No Typology Code available for payment source | Attending: Emergency Medicine | Admitting: Emergency Medicine

## 2022-08-09 DIAGNOSIS — I10 Essential (primary) hypertension: Secondary | ICD-10-CM | POA: Diagnosis not present

## 2022-08-09 DIAGNOSIS — I160 Hypertensive urgency: Secondary | ICD-10-CM | POA: Insufficient documentation

## 2022-08-09 DIAGNOSIS — R519 Headache, unspecified: Secondary | ICD-10-CM | POA: Diagnosis present

## 2022-08-09 DIAGNOSIS — Z79899 Other long term (current) drug therapy: Secondary | ICD-10-CM | POA: Diagnosis not present

## 2022-08-09 LAB — CBC
HCT: 44.6 % (ref 36.0–46.0)
Hemoglobin: 14.3 g/dL (ref 12.0–15.0)
MCH: 27.6 pg (ref 26.0–34.0)
MCHC: 32.1 g/dL (ref 30.0–36.0)
MCV: 85.9 fL (ref 80.0–100.0)
Platelets: 307 10*3/uL (ref 150–400)
RBC: 5.19 MIL/uL — ABNORMAL HIGH (ref 3.87–5.11)
RDW: 13.2 % (ref 11.5–15.5)
WBC: 7.4 10*3/uL (ref 4.0–10.5)
nRBC: 0 % (ref 0.0–0.2)

## 2022-08-09 LAB — BASIC METABOLIC PANEL
Anion gap: 9 (ref 5–15)
BUN: 21 mg/dL — ABNORMAL HIGH (ref 6–20)
CO2: 23 mmol/L (ref 22–32)
Calcium: 9.2 mg/dL (ref 8.9–10.3)
Chloride: 106 mmol/L (ref 98–111)
Creatinine, Ser: 1.28 mg/dL — ABNORMAL HIGH (ref 0.44–1.00)
GFR, Estimated: 50 mL/min — ABNORMAL LOW (ref >60)
Glucose, Bld: 110 mg/dL — ABNORMAL HIGH (ref 70–99)
Potassium: 3.5 mmol/L (ref 3.5–5.1)
Sodium: 138 mmol/L (ref 135–145)

## 2022-08-09 MED ORDER — SODIUM CHLORIDE 0.9 % IV BOLUS
1000.0000 mL | Freq: Once | INTRAVENOUS | Status: AC
  Administered 2022-08-09: 1000 mL via INTRAVENOUS

## 2022-08-09 MED ORDER — CLONIDINE HCL 0.1 MG PO TABS
0.2000 mg | ORAL_TABLET | Freq: Once | ORAL | Status: AC
  Administered 2022-08-09: 0.2 mg via ORAL
  Filled 2022-08-09: qty 2

## 2022-08-09 MED ORDER — CLONIDINE HCL 0.2 MG PO TABS: 0.2000 mg | ORAL_TABLET | Freq: Every day | ORAL | 0 refills | Status: AC | PRN

## 2022-08-09 NOTE — ED Provider Notes (Signed)
[place] EMERGENCY DEPARTMENT AT [facility] Provider Note   CSN: [id number] Arrival date & time: 08/09/22  1727     History  Chief Complaint  Patient presents with   Hypertension    Tina Richardson is a 52 y.o. female with history of hypertension, anxiety who presents the emergency department from urgent care with concern of elevated blood pressure readings.  Patient initially presented to urgent care with chief complaint of a headache.  She said that she gets headaches when her blood pressure is high.  She does not have a way to check this at home, but is on several blood pressure medications.  She states that her doctor had been changing her from 1 tablet (25 mg) to 2 tablets of spironolactone daily.  When she goes up to 2 tablets she often has hypotension, but when she stays on 1 tablet she has hypertension.  Urgent care gave her a migraine cocktail and she says her headache is completely resolved at this time.  She has no residual pain, chest pain, blurry vision.  She is otherwise been feeling well, except does endorse increased stress with work.  She feels she is dehydrated though she has not been drinking enough water, and her mouth is dry.  HPI     Home Medications Prior to Admission medications   Medication Sig Start Date End Date Taking? Authorizing Provider  cloNIDine (CATAPRES) 0.2 MG tablet Take 1 tablet (0.2 mg total) by mouth daily as needed (if BP > 180/110). 08/09/22  Yes Wanita Derenzo T, PA-C  albuterol (VENTOLIN HFA) 108 (90 Base) MCG/ACT inhaler Inhale 2 puffs into the lungs every 4 (four) hours as needed for wheezing or shortness of breath. 02/14/22   Lurline Idol, FNP  atorvastatin (LIPITOR) 40 MG tablet Take 1 tablet (40 mg total) by mouth daily. Patient not taking: Reported on 04/22/2020 04/14/20   Jarold Motto, PA  busPIRone (BUSPAR) 5 MG tablet Take 1 tablet (5 mg total) by mouth 2 (two) times daily. 08/27/21   Jarold Motto, PA   Dextromethorphan-guaiFENesin (MUCINEX DM MAXIMUM STRENGTH) 60-1200 MG TB12 Take 1 tablet by mouth 2 (two) times daily. 02/14/22   Lurline Idol, FNP  doxycycline (VIBRAMYCIN) 100 MG capsule Take 1 capsule (100 mg total) by mouth 2 (two) times daily. 02/14/22   Lurline Idol, FNP  lisinopril-hydrochlorothiazide (ZESTORETIC) 20-12.5 MG tablet Take 1 tablet by mouth daily. 04/14/20   Jarold Motto, PA  methylPREDNISolone (MEDROL DOSEPAK) 4 MG TBPK tablet Take as directed 02/14/22   Lurline Idol, FNP  promethazine-dextromethorphan (PROMETHAZINE-DM) 6.25-15 MG/5ML syrup Take 10 mLs by mouth every 6 (six) hours as needed for cough. 02/14/22   Lurline Idol, FNP  spironolactone (ALDACTONE) 25 MG tablet TAKE 1 TABLET(25 MG) BY MOUTH TWICE DAILY Patient taking differently: Take 25 mg by mouth 2 (two) times daily. 04/14/20   Jarold Motto, PA      Allergies    Dilaudid [hydromorphone hcl] and Norvasc [amlodipine besylate]    Review of Systems   Review of Systems  Constitutional:  Positive for fatigue.  All other systems reviewed and are negative.   Physical Exam Updated Vital Signs BP (!) 154/95   Pulse 80   Temp 98 F (36.7 C) (Oral)   Resp 18   LMP 03/24/2017 (Approximate)   SpO2 95%  Physical Exam Vitals and nursing note reviewed.  Constitutional:      Appearance: Normal appearance.  HENT:     Head: Normocephalic and atraumatic.  Eyes:  Conjunctiva/sclera: Conjunctivae normal.  Cardiovascular:     Rate and Rhythm: Normal rate and regular rhythm.  Pulmonary:     Effort: Pulmonary effort is normal. No respiratory distress.     Breath sounds: Normal breath sounds.  Abdominal:     General: There is no distension.     Palpations: Abdomen is soft.     Tenderness: There is no abdominal tenderness.  Skin:    General: Skin is warm and dry.  Neurological:     General: No focal deficit present.     Mental Status: She is alert.     Comments: Neuro: Speech is clear,  able to follow commands. CN III-XII intact grossly intact. PERRLA. EOMI. Sensation intact throughout. Str 5/5 all extremities.     ED Results / Procedures / Treatments   Labs (all labs ordered are listed, but only abnormal results are displayed) Labs Reviewed  CBC - Abnormal; Notable for the following components:      Result Value   RBC 5.19 (*)    All other components within normal limits  BASIC METABOLIC PANEL - Abnormal; Notable for the following components:   Glucose, Bld 110 (*)    BUN 21 (*)    Creatinine, Ser 1.28 (*)    GFR, Estimated 50 (*)    All other components within normal limits    EKG EKG Interpretation Date/Time:  Tuesday August 09 2022 17:46:25 EDT Ventricular Rate:  73 PR Interval:  169 QRS Duration:  77 QT Interval:  409 QTC Calculation: 451 R Axis:   60  Text Interpretation: Sinus rhythm Probable left atrial enlargement Confirmed by Alvester Chou [phone]) on 08/09/2022 5:58:44 PM  Radiology No results found.  Procedures Procedures    Medications Ordered in ED Medications  sodium chloride 0.9 % bolus 1,000 mL (0 mLs Intravenous Stopped 08/09/22 2024)  cloNIDine (CATAPRES) tablet 0.2 mg (0.2 mg Oral Given 08/09/22 1932)    ED Course/ Medical Decision Making/ A&P                             Medical Decision Making Amount and/or Complexity of Data Reviewed Labs: ordered.  Risk Prescription drug management.   This patient is a 52 y.o. female  who presents to the ED for concern of elevated blood pressure.   Differential diagnoses prior to evaluation: The emergent differential diagnosis includes, but is not limited to, primary hypertension, hypertensive urgency, hypertensive emergency. This is not an exhaustive differential.   Past Medical History / Co-morbidities / Social History: hypertension, anxiety  Additional history: Chart reviewed. Pertinent results include: Reviewed urgent care note from outside facility earlier today, patient was  given IV fluids, Compazine, Benadryl, Decadron.  Also reviewed POC metabolic panel significant for creatinine 1.5, GFR 56.  Physical Exam: Physical exam performed. The pertinent findings include: Initially hypertensive to 216/116, otherwise normal vital signs.  No acute distress, only complaining of some fatigue.  Headache resolved.  Normal neurologic exam as above.  Lab Tests/Imaging studies: I personally interpreted labs/imaging and the pertinent results include: No leukocytosis, normal hemoglobin.  BMP significant for mildly elevated creatinine, appears stable compared to patient prior.  I considered head imaging in this patient with concern for headache and significant hypertension, however as headache is completely resolved on my exam, and she has no focal deficits, do not believe that she requires it.  Cardiac monitoring: EKG obtained and interpreted by myself and attending physician which shows: Normal sinus  rhythm   Medications: I ordered medication including IVF, clonidine.  I have reviewed the patients home medicines and have made adjustments as needed.  On reevaluation patient's blood pressure significantly improved, last reading for discharge was 154/95.   Disposition: After consideration of the diagnostic results and the patients response to treatment, I feel that emergency department workup does not suggest an emergent condition requiring admission or immediate intervention beyond what has been performed at this time. The plan is: Charged home with ongoing management of hypertension.  Advised that she start taking 1-1/2 tablets of her spironolactone daily, and will prescribe clonidine as rescue medication.  Advised when I would like her to take this, and encouraged her to get blood pressure cuff for home and keep track of her readings.  Strongly encouraged follow-up with PCP.  Given close return precautions.. The patient is safe for discharge and has been instructed to return  immediately for worsening symptoms, change in symptoms or any other concerns.  Final Clinical Impression(s) / ED Diagnoses Final diagnoses:  Hypertensive urgency    Rx / DC Orders ED Discharge Orders          Ordered    cloNIDine (CATAPRES) 0.2 MG tablet  Daily PRN        08/09/22 2031           Portions of this report may have been transcribed using voice recognition software. Every effort was made to ensure accuracy; however, inadvertent computerized transcription errors may be present.    Jeanella Flattery 08/09/22 2105    Terald Sleeper, MD 08/10/22 1426

## 2022-08-09 NOTE — ED Triage Notes (Signed)
Pt arrived via EMS from UC pt lives at home. Presented at [facility] for headache bp was 192/124. UC gave decadron 10, compazine, benadryl 50 via IV. Rass score -1 per EMS. Pt denies Denies N/v/d 168/120 bp 78 HR 16 98% 97 cbg

## 2022-08-09 NOTE — Discharge Instructions (Signed)
You were seen in the ER for elevated blood pressure.  As we discussed, your blood work looked reassuring. I'm glad your headache resolved after medications given at urgent care. Your blood pressure came down well with the medication I gave you.   I want you to take 1.5 tablets of your spironolactone daily (25 mg + 12.5 mg). I'm sending an additional medication you can use as needed for high BP readings (> 180/110)Please get a blood pressure cuff for home and keep track of your readings.   Please follow up with your primary doctor regarding your blood pressure management.   Continue to monitor how you're doing and return to the ER for new or worsening symptoms.

## 2022-08-22 ENCOUNTER — Telehealth: Payer: Self-pay | Admitting: Physician Assistant

## 2022-08-22 MED ORDER — SPIRONOLACTONE 25 MG PO TABS: ORAL_TABLET | ORAL | 1 refills | Status: AC

## 2022-08-22 NOTE — Telephone Encounter (Signed)
Prescription Request  08/22/2022  LOV: 08/27/2021  What is the name of the medication or equipment?  spironolactone (ALDACTONE) 25 MG tablet   Have you contacted your pharmacy to request a refill? No   Which pharmacy would you like this sent to? [id number] [place], [place] - [address] AT [facility] [address] [facility] [address] [place] [address] Phone: [phone] Fax: [phone]   Patient notified that their request is being sent to the clinical staff for review and that they should receive a response within 2 business days.   Please advise at Mobile [phone] (mobile)

## 2022-08-22 NOTE — Telephone Encounter (Signed)
Spoke to pt told her Rx for Spironolactone was sent to the pharmacy as requested. Pt verbalized understanding.

## 2022-08-23 NOTE — Progress Notes (Shared)
Naveyah Monfort is a 52 y.o. female here for a medication refill.  History of Present Illness:   No chief complaint on file.   HPI     Past Medical History:  Diagnosis Date   Anxiety    Diarrhea 10/20/2021   Gestational diabetes    diet controlled per patient   High-risk pregnancy    Hypertension    Multiparity      Social History   Tobacco Use   Smoking status: Never   Smokeless tobacco: Never  Vaping Use   Vaping status: Never Used  Substance Use Topics   Alcohol use: Yes    Comment: occasionally   Drug use: No    Past Surgical History:  Procedure Laterality Date   CESAREAN SECTION  02/06/2012   Procedure: CESAREAN SECTION;  Surgeon: Tereso Newcomer, MD;  Location: [facility];  Service: Obstetrics;  Laterality: N/A;  Primary Cesarean Section Delivery Baby Boy @ 0000, Apgars 8/9    NO PAST SURGERIES      Family History  Problem Relation Age of Onset   Diabetes Mother    Hypertension Father    Diabetes Father    COPD Father    Asthma Father    Breast cancer Paternal Grandmother    Other Neg Hx     Allergies  Allergen Reactions   Dilaudid [Hydromorphone Hcl] Other (See Comments)    Could not stop shaking   Norvasc [Amlodipine Besylate] Swelling    Leg swelling    Current Medications:   Current Outpatient Medications:    albuterol (VENTOLIN HFA) 108 (90 Base) MCG/ACT inhaler, Inhale 2 puffs into the lungs every 4 (four) hours as needed for wheezing or shortness of breath., Disp: 18 g, Rfl: 0   atorvastatin (LIPITOR) 40 MG tablet, Take 1 tablet (40 mg total) by mouth daily. (Patient not taking: Reported on 04/22/2020), Disp: 90 tablet, Rfl: 1   busPIRone (BUSPAR) 5 MG tablet, Take 1 tablet (5 mg total) by mouth 2 (two) times daily., Disp: 30 tablet, Rfl: 0   cloNIDine (CATAPRES) 0.2 MG tablet, Take 1 tablet (0.2 mg total) by mouth daily as needed (if BP > 180/110)., Disp: 14 tablet, Rfl: 0   Dextromethorphan-guaiFENesin (MUCINEX DM MAXIMUM STRENGTH)  60-1200 MG TB12, Take 1 tablet by mouth 2 (two) times daily., Disp: 20 tablet, Rfl: 0   doxycycline (VIBRAMYCIN) 100 MG capsule, Take 1 capsule (100 mg total) by mouth 2 (two) times daily., Disp: 14 capsule, Rfl: 0   lisinopril-hydrochlorothiazide (ZESTORETIC) 20-12.5 MG tablet, Take 1 tablet by mouth daily., Disp: 90 tablet, Rfl: 1   methylPREDNISolone (MEDROL DOSEPAK) 4 MG TBPK tablet, Take as directed, Disp: 21 tablet, Rfl: 0   promethazine-dextromethorphan (PROMETHAZINE-DM) 6.25-15 MG/5ML syrup, Take 10 mLs by mouth every 6 (six) hours as needed for cough., Disp: 118 mL, Rfl: 0   spironolactone (ALDACTONE) 25 MG tablet, TAKE 1 TABLET(25 MG) BY MOUTH TWICE DAILY, Disp: 180 tablet, Rfl: 1  Current Facility-Administered Medications:    alum & mag hydroxide-simeth (MAALOX PLUS) 400-400-40 MG/5ML suspension 15 mL, 15 mL, Oral, Q4H PRN, Lula Olszewski, MD   Review of Systems:   ROS  Vitals:   There were no vitals filed for this visit.   There is no height or weight on file to calculate BMI.  Physical Exam:   Physical Exam  Assessment and Plan:   ***   I,Alexander Ruley,acting as a scribe for Jarold Motto, PA.,have documented all relevant documentation on the behalf of Lelon Mast  Bufford Buttner, PA,as directed by  Jarold Motto, PA while in the presence of [place], [place].   ***   Jarold Motto, [place]

## 2022-08-24 ENCOUNTER — Ambulatory Visit: Payer: No Typology Code available for payment source | Admitting: Physician Assistant

## 2022-08-31 ENCOUNTER — Ambulatory Visit: Payer: No Typology Code available for payment source | Admitting: Physician Assistant

## 2022-12-25 IMAGING — CT CT CARDIAC CORONARY ARTERY CALCIUM SCORE
3 series · 14 of 20 positions shown, 15 images · non-contrast
Comparison: None.
COMPARISON: None.

Addendum:
EXAM:
OVER-READ INTERPRETATION  CT CHEST

The following report is an over-read performed by radiologist Dr.
Israel Archibald [[email or web address]] on 04/28/2020. This
over-read does not include interpretation of cardiac or coronary
anatomy or pathology. The coronary calcium score interpretation by
the cardiologist is attached.
CLINICAL DATA: Cardiovascular Disease Risk stratification
Coronary Calcium Score
TECHNIQUE: A gated, non-contrast computed tomography scan of the heart was
performed using 3mm slice thickness. Axial images were analyzed on a
dedicated workstation. Calcium scoring of the coronary arteries was
performed using the Agatston method.

[Series 2: casc 3.0 bv41 2 bestdiast 75 % · axial · 0.37mm/px · z∈[-252,-186]mm · 4 of 38 slices shown, 5 images]
[im 8/38  vessel]
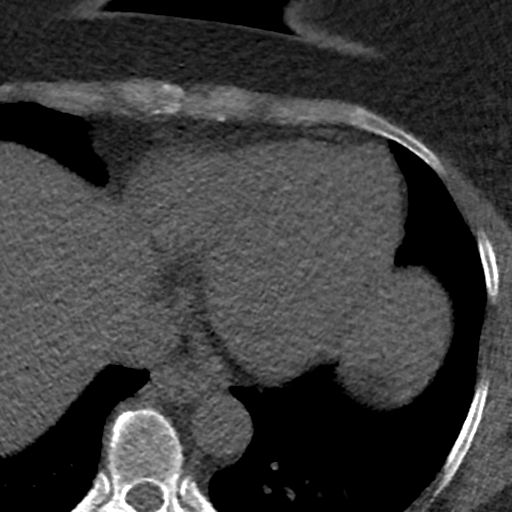
[im 8/38  lung]
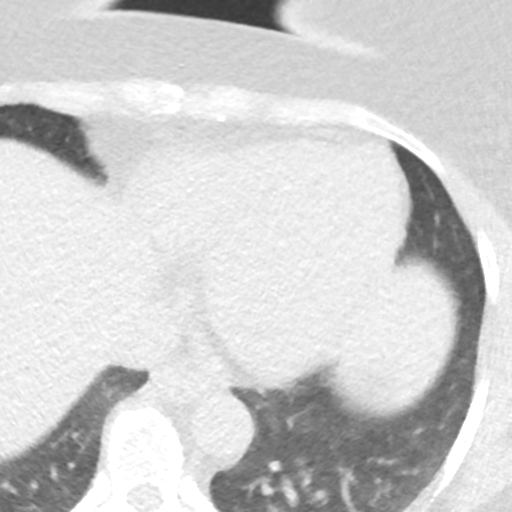
[im 15/38  vessel]
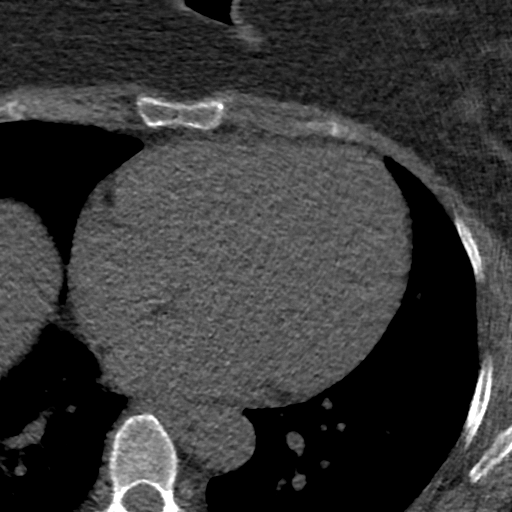
[im 23/38  vessel]
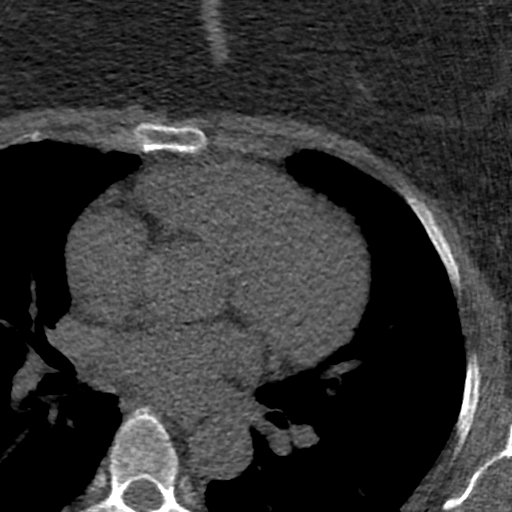
[im 30/38  vessel]
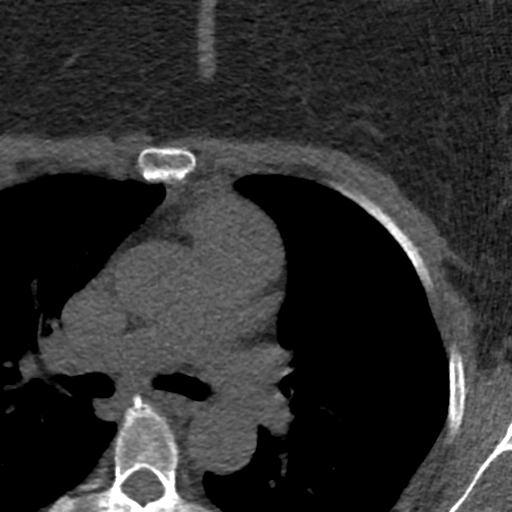

[Series 3: lung 76 % · axial · 0.67mm/px · z∈[-255,-183]mm · 5 of 38 slices shown]
[im 7/38  lung]
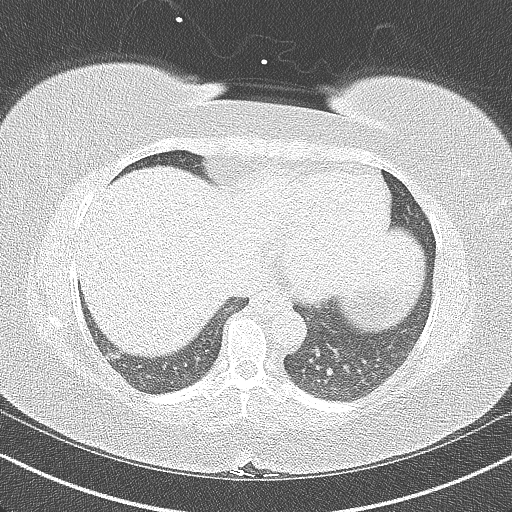
[im 13/38  lung]
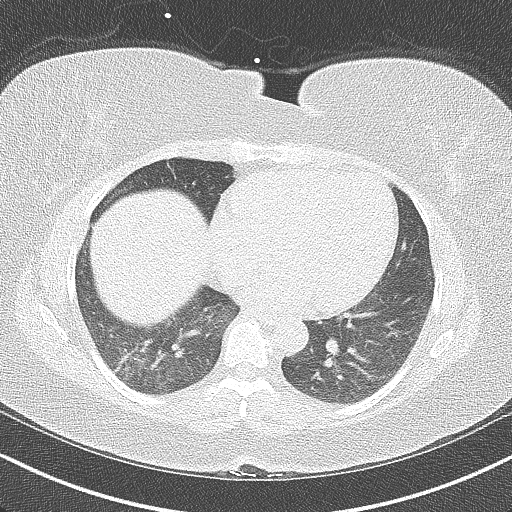
[im 19/38  lung]
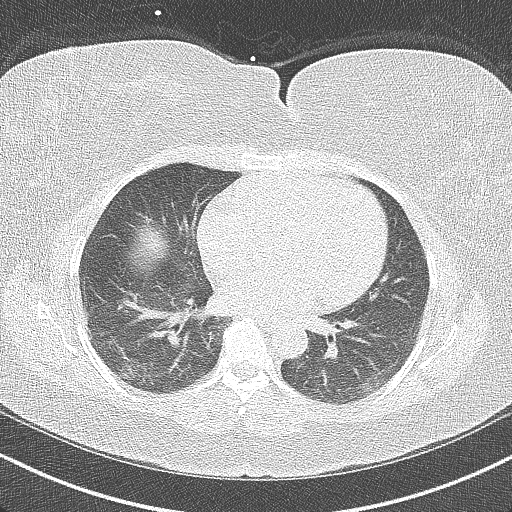
[im 25/38  lung]
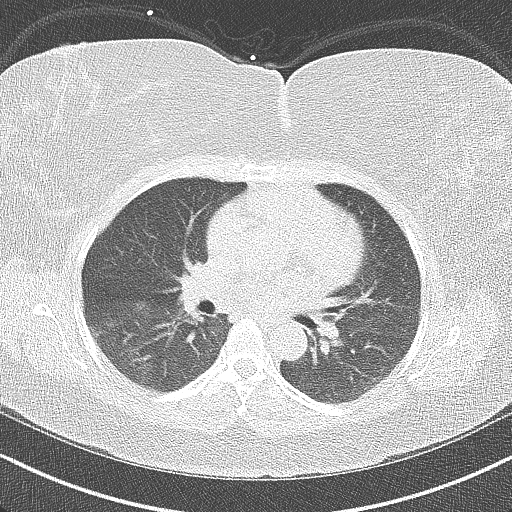
[im 31/38  lung]
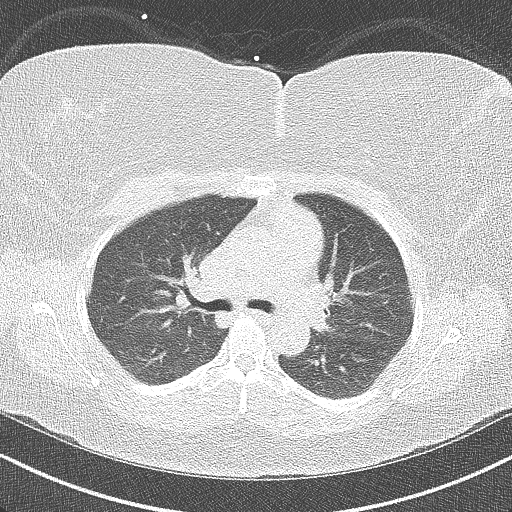

[Series 4: lung st 76 % · axial · 0.67mm/px · z∈[-255,-183]mm · 5 of 38 slices shown]
[im 7/38  lung]
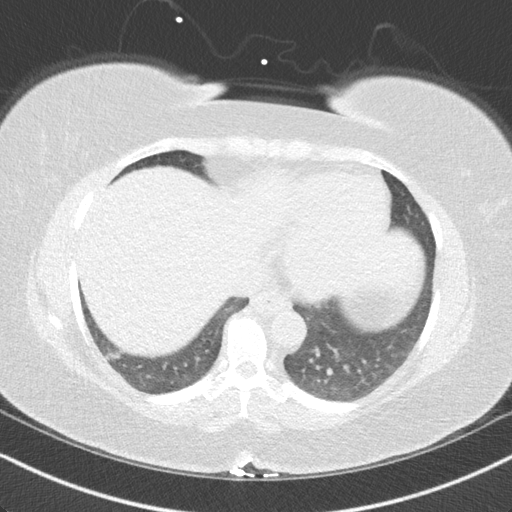
[im 13/38  lung]
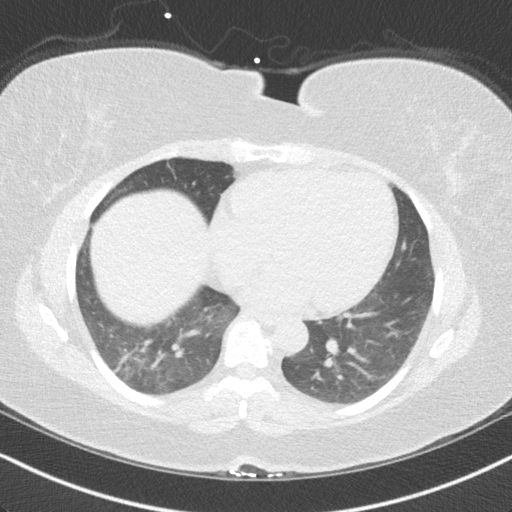
[im 19/38  lung]
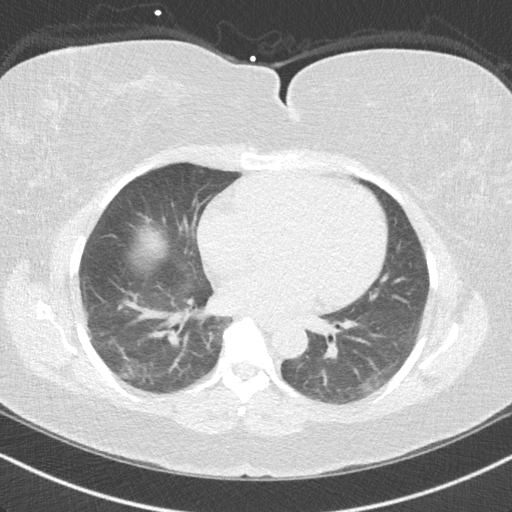
[im 25/38  lung]
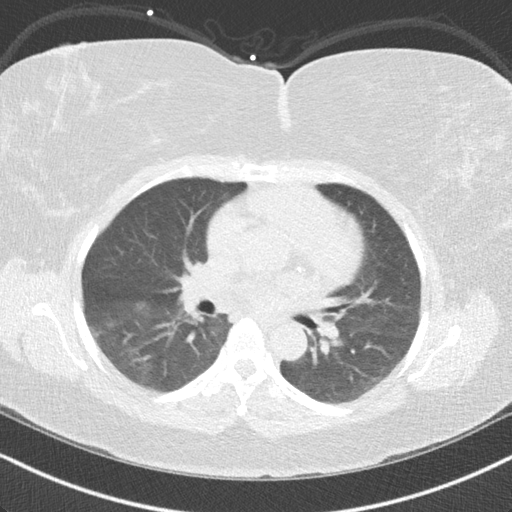
[im 31/38  lung]
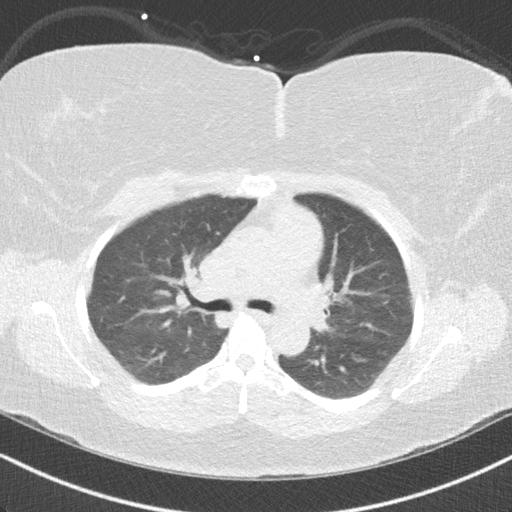

[14 of 20 positions shown; findings below may reference images not displayed]

FINDINGS: Areas of mild linear scarring are noted in the lung bases
bilaterally. Within the visualized portions of the thorax there are
no suspicious appearing pulmonary nodules or masses, there is no
acute consolidative airspace disease, no pleural effusions, no
pneumothorax and no lymphadenopathy. Visualized portions of the
upper abdomen are unremarkable. There are no aggressive appearing
lytic or blastic lesions noted in the visualized portions of the
skeleton.
IMPRESSION: 1. Mild linear scarring in the lung bases bilaterally.
FINDINGS: Coronary arteries: Normal origins.

Coronary Calcium Score:

Left main: 0

Left anterior descending artery:

Left circumflex artery: 15

Right coronary artery: 0

Total:

Percentile: 95 th

Pericardium: Normal.

Ascending Aorta: Normal caliber.

Non-cardiac: See separate report from [REDACTED].
IMPRESSION: Coronary calcium score of 40.5 . This was 95 th percentile for age-,
race-, and sex-matched controls.



If CAC=0, it is reasonable to withhold statin therapy and reassess
in 5 to 10 years, as long as higher risk conditions are absent
(diabetes mellitus, family history of premature CHD in first degree
relatives (males <55 years; females <65 years), cigarette smoking,
or LDL >=190 mg/dL).

If CAC is 1 to 99, it is reasonable to initiate statin therapy for
patients >=55 years of age.

If CAC is >=100 or >=75th percentile, it is reasonable to initiate
statin therapy at any age.

Cardiology referral should be considered for patients with CAC
scores >=400 or >=75th percentile.

*8403 AHA/ACC/AACVPR/AAPA/ABC/DFG/EBERT/TIGER/[place]/CEEJAY/MONO/BAGSHAWE
Guideline on the Management of Blood Cholesterol: A Report of the
American College of Cardiology/American Heart Association Task Force
on Clinical Practice Guidelines. J Am Coll Cardiol.
3725;73(24):4505-4569.

*** End of Addendum ***
EXAM:
OVER-READ INTERPRETATION  CT CHEST

The following report is an over-read performed by radiologist Dr.
Israel Archibald [REDACTED] on 04/28/2020. This
over-read does not include interpretation of cardiac or coronary
anatomy or pathology. The coronary calcium score interpretation by
the cardiologist is attached.
FINDINGS: Areas of mild linear scarring are noted in the lung bases
bilaterally. Within the visualized portions of the thorax there are
no suspicious appearing pulmonary nodules or masses, there is no
acute consolidative airspace disease, no pleural effusions, no
pneumothorax and no lymphadenopathy. Visualized portions of the
upper abdomen are unremarkable. There are no aggressive appearing
lytic or blastic lesions noted in the visualized portions of the
skeleton.
IMPRESSION: 1. Mild linear scarring in the lung bases bilaterally.
